# Patient Record
Sex: Female | Born: 1950
Health system: Southern US, Community
[De-identification: ages and names within clinical notes are randomized; demographics above are authoritative.]

## PROBLEM LIST (undated history)

## (undated) DIAGNOSIS — F419 Anxiety disorder, unspecified: Secondary | ICD-10-CM

## (undated) DIAGNOSIS — M67919 Unspecified disorder of synovium and tendon, unspecified shoulder: Secondary | ICD-10-CM

## (undated) DIAGNOSIS — I10 Essential (primary) hypertension: Secondary | ICD-10-CM

## (undated) DIAGNOSIS — M25559 Pain in unspecified hip: Secondary | ICD-10-CM

## (undated) DIAGNOSIS — M810 Age-related osteoporosis without current pathological fracture: Secondary | ICD-10-CM

## (undated) DIAGNOSIS — G43909 Migraine, unspecified, not intractable, without status migrainosus: Secondary | ICD-10-CM

## (undated) DIAGNOSIS — M1990 Unspecified osteoarthritis, unspecified site: Secondary | ICD-10-CM

## (undated) DIAGNOSIS — G2581 Restless legs syndrome: Secondary | ICD-10-CM

## (undated) DIAGNOSIS — Z8719 Personal history of other diseases of the digestive system: Secondary | ICD-10-CM

## (undated) DIAGNOSIS — G44009 Cluster headache syndrome, unspecified, not intractable: Secondary | ICD-10-CM

## (undated) DIAGNOSIS — N943 Premenstrual tension syndrome: Secondary | ICD-10-CM

## (undated) DIAGNOSIS — H409 Unspecified glaucoma: Secondary | ICD-10-CM

## (undated) DIAGNOSIS — F329 Major depressive disorder, single episode, unspecified: Secondary | ICD-10-CM

## (undated) DIAGNOSIS — M719 Bursopathy, unspecified: Secondary | ICD-10-CM

## (undated) DIAGNOSIS — J45909 Unspecified asthma, uncomplicated: Secondary | ICD-10-CM

## (undated) HISTORY — DX: Bursopathy, unspecified: M71.9

## (undated) HISTORY — DX: Unspecified glaucoma: H40.9

## (undated) HISTORY — DX: Restless legs syndrome: G25.81

## (undated) HISTORY — DX: Unspecified disorder of synovium and tendon, unspecified shoulder: M67.919

## (undated) HISTORY — DX: Essential (primary) hypertension: I10

## (undated) HISTORY — DX: Anxiety disorder, unspecified: F41.9

## (undated) HISTORY — DX: Migraine, unspecified, not intractable, without status migrainosus: G43.909

## (undated) HISTORY — PX: BREAST BIOPSY: SHX20

## (undated) HISTORY — DX: Pain in unspecified hip: M25.559

## (undated) HISTORY — DX: Premenstrual tension syndrome: N94.3

## (undated) HISTORY — DX: Cluster headache syndrome, unspecified, not intractable: G44.009

## (undated) HISTORY — DX: Age-related osteoporosis without current pathological fracture: M81.0

## (undated) HISTORY — PX: ABDOMINAL HYSTERECTOMY: SHX81

## (undated) HISTORY — DX: Major depressive disorder, single episode, unspecified: F32.9

---

## 1998-04-22 ENCOUNTER — Other Ambulatory Visit: Admission: RE | Admit: 1998-04-22 | Discharge: 1998-04-22 | Payer: Self-pay | Admitting: Radiology

## 1999-01-11 ENCOUNTER — Other Ambulatory Visit: Admission: RE | Admit: 1999-01-11 | Discharge: 1999-01-11 | Payer: Self-pay | Admitting: Gynecology

## 2000-01-12 ENCOUNTER — Other Ambulatory Visit: Admission: RE | Admit: 2000-01-12 | Discharge: 2000-01-12 | Payer: Self-pay | Admitting: Gynecology

## 2001-01-26 ENCOUNTER — Other Ambulatory Visit: Admission: RE | Admit: 2001-01-26 | Discharge: 2001-01-26 | Payer: Self-pay | Admitting: Gynecology

## 2001-02-02 ENCOUNTER — Encounter: Payer: Self-pay | Admitting: Gynecology

## 2001-02-02 ENCOUNTER — Ambulatory Visit (HOSPITAL_COMMUNITY): Admission: RE | Admit: 2001-02-02 | Discharge: 2001-02-02 | Payer: Self-pay | Admitting: Gynecology

## 2002-02-01 ENCOUNTER — Other Ambulatory Visit: Admission: RE | Admit: 2002-02-01 | Discharge: 2002-02-01 | Payer: Self-pay | Admitting: Gynecology

## 2002-05-24 ENCOUNTER — Ambulatory Visit (HOSPITAL_COMMUNITY): Admission: RE | Admit: 2002-05-24 | Discharge: 2002-05-24 | Payer: Self-pay | Admitting: Gastroenterology

## 2002-05-24 ENCOUNTER — Encounter (INDEPENDENT_AMBULATORY_CARE_PROVIDER_SITE_OTHER): Payer: Self-pay | Admitting: Specialist

## 2002-07-19 ENCOUNTER — Other Ambulatory Visit: Admission: RE | Admit: 2002-07-19 | Discharge: 2002-07-19 | Payer: Self-pay | Admitting: Gynecology

## 2003-02-18 ENCOUNTER — Other Ambulatory Visit: Admission: RE | Admit: 2003-02-18 | Discharge: 2003-02-18 | Payer: Self-pay | Admitting: Gynecology

## 2003-09-19 ENCOUNTER — Encounter: Admission: RE | Admit: 2003-09-19 | Discharge: 2003-09-19 | Payer: Self-pay | Admitting: Family Medicine

## 2004-03-04 ENCOUNTER — Other Ambulatory Visit: Admission: RE | Admit: 2004-03-04 | Discharge: 2004-03-04 | Payer: Self-pay | Admitting: Gynecology

## 2004-04-09 ENCOUNTER — Ambulatory Visit: Payer: Self-pay | Admitting: Family Medicine

## 2004-08-20 ENCOUNTER — Ambulatory Visit: Payer: Self-pay | Admitting: Family Medicine

## 2004-09-13 ENCOUNTER — Ambulatory Visit: Payer: Self-pay | Admitting: Family Medicine

## 2004-09-27 ENCOUNTER — Ambulatory Visit: Payer: Self-pay | Admitting: Gastroenterology

## 2004-09-27 ENCOUNTER — Ambulatory Visit: Payer: Self-pay | Admitting: Family Medicine

## 2004-10-01 ENCOUNTER — Encounter: Admission: RE | Admit: 2004-10-01 | Discharge: 2004-10-01 | Payer: Self-pay | Admitting: Family Medicine

## 2004-10-11 ENCOUNTER — Ambulatory Visit: Payer: Self-pay | Admitting: Gastroenterology

## 2004-10-12 ENCOUNTER — Encounter: Admission: RE | Admit: 2004-10-12 | Discharge: 2004-10-12 | Payer: Self-pay | Admitting: Family Medicine

## 2004-11-12 ENCOUNTER — Ambulatory Visit: Payer: Self-pay | Admitting: Family Medicine

## 2005-03-09 ENCOUNTER — Other Ambulatory Visit: Admission: RE | Admit: 2005-03-09 | Discharge: 2005-03-09 | Payer: Self-pay | Admitting: Gynecology

## 2005-08-23 ENCOUNTER — Ambulatory Visit: Payer: Self-pay | Admitting: Family Medicine

## 2005-10-03 ENCOUNTER — Ambulatory Visit: Payer: Self-pay | Admitting: Family Medicine

## 2005-10-10 ENCOUNTER — Ambulatory Visit: Payer: Self-pay | Admitting: Family Medicine

## 2005-12-02 ENCOUNTER — Ambulatory Visit: Payer: Self-pay | Admitting: Family Medicine

## 2006-03-13 ENCOUNTER — Other Ambulatory Visit: Admission: RE | Admit: 2006-03-13 | Discharge: 2006-03-13 | Payer: Self-pay | Admitting: Gynecology

## 2006-06-26 ENCOUNTER — Ambulatory Visit (HOSPITAL_COMMUNITY): Admission: RE | Admit: 2006-06-26 | Discharge: 2006-06-26 | Payer: Self-pay | Admitting: Gastroenterology

## 2006-08-31 ENCOUNTER — Ambulatory Visit: Payer: Self-pay | Admitting: Family Medicine

## 2006-09-21 ENCOUNTER — Ambulatory Visit: Payer: Self-pay | Admitting: Family Medicine

## 2006-09-25 ENCOUNTER — Encounter: Payer: Self-pay | Admitting: Family Medicine

## 2006-09-25 DIAGNOSIS — I10 Essential (primary) hypertension: Secondary | ICD-10-CM

## 2006-09-25 DIAGNOSIS — H409 Unspecified glaucoma: Secondary | ICD-10-CM

## 2006-09-25 DIAGNOSIS — F3289 Other specified depressive episodes: Secondary | ICD-10-CM | POA: Insufficient documentation

## 2006-09-25 DIAGNOSIS — F329 Major depressive disorder, single episode, unspecified: Secondary | ICD-10-CM

## 2006-09-25 HISTORY — DX: Major depressive disorder, single episode, unspecified: F32.9

## 2006-09-25 HISTORY — DX: Essential (primary) hypertension: I10

## 2006-09-25 HISTORY — DX: Unspecified glaucoma: H40.9

## 2006-09-25 HISTORY — DX: Other specified depressive episodes: F32.89

## 2006-10-05 ENCOUNTER — Ambulatory Visit: Payer: Self-pay | Admitting: Family Medicine

## 2006-10-05 LAB — CONVERTED CEMR LAB
ALT: 35 units/L (ref 0–35)
AST: 35 units/L (ref 0–37)
Albumin: 3.7 g/dL (ref 3.5–5.2)
Alkaline Phosphatase: 98 units/L (ref 39–117)
BUN: 12 mg/dL (ref 6–23)
Basophils Absolute: 0 10*3/uL (ref 0.0–0.1)
Basophils Relative: 0.7 % (ref 0.0–1.0)
Bilirubin, Direct: 0.1 mg/dL (ref 0.0–0.3)
Blood in Urine, dipstick: NEGATIVE
CO2: 29 meq/L (ref 19–32)
Calcium: 10.3 mg/dL (ref 8.4–10.5)
Chloride: 105 meq/L (ref 96–112)
Cholesterol: 222 mg/dL (ref 0–200)
Creatinine, Ser: 0.5 mg/dL (ref 0.4–1.2)
Direct LDL: 151.6 mg/dL
Eosinophils Absolute: 0.1 10*3/uL (ref 0.0–0.6)
Eosinophils Relative: 3.6 % (ref 0.0–5.0)
GFR calc Af Amer: 166 mL/min
GFR calc non Af Amer: 137 mL/min
Glucose, Bld: 91 mg/dL (ref 70–99)
Glucose, Urine, Semiquant: NEGATIVE
HCT: 39.2 % (ref 36.0–46.0)
HDL: 46.5 mg/dL (ref >39.0)
Hemoglobin: 13.5 g/dL (ref 12.0–15.0)
Ketones, urine, test strip: NEGATIVE
Lymphocytes Relative: 60 % — ABNORMAL HIGH (ref 12.0–46.0)
MCHC: 34.5 g/dL (ref 30.0–36.0)
MCV: 88.5 fL (ref 78.0–100.0)
Monocytes Absolute: 0.3 10*3/uL (ref 0.2–0.7)
Monocytes Relative: 11 % (ref 3.0–11.0)
Neutro Abs: 0.6 10*3/uL — ABNORMAL LOW (ref 1.4–7.7)
Neutrophils Relative %: 24.7 % — ABNORMAL LOW (ref 43.0–77.0)
Nitrite: NEGATIVE
Platelets: 261 10*3/uL (ref 150–400)
Potassium: 3.9 meq/L (ref 3.5–5.1)
Protein, U semiquant: NEGATIVE
RBC: 4.43 M/uL (ref 3.87–5.11)
RDW: 12.2 % (ref 11.5–14.6)
Sodium: 141 meq/L (ref 135–145)
Specific Gravity, Urine: 1.02
TSH: 1.62 microintl units/mL (ref 0.35–5.50)
Total Bilirubin: 0.6 mg/dL (ref 0.3–1.2)
Total CHOL/HDL Ratio: 4.8
Total Protein: 7.6 g/dL (ref 6.0–8.3)
Triglycerides: 82 mg/dL (ref 0–149)
Urobilinogen, UA: 1
VLDL: 16 mg/dL (ref 0–40)
WBC Urine, dipstick: NEGATIVE
WBC: 2.3 10*3/uL — ABNORMAL LOW (ref 4.5–10.5)
pH: 7

## 2006-10-14 DIAGNOSIS — G43909 Migraine, unspecified, not intractable, without status migrainosus: Secondary | ICD-10-CM | POA: Insufficient documentation

## 2006-10-14 DIAGNOSIS — N943 Premenstrual tension syndrome: Secondary | ICD-10-CM | POA: Insufficient documentation

## 2006-10-14 HISTORY — DX: Premenstrual tension syndrome: N94.3

## 2006-10-14 HISTORY — DX: Migraine, unspecified, not intractable, without status migrainosus: G43.909

## 2006-10-16 ENCOUNTER — Ambulatory Visit: Payer: Self-pay | Admitting: Family Medicine

## 2006-10-16 DIAGNOSIS — G2581 Restless legs syndrome: Secondary | ICD-10-CM

## 2006-10-16 DIAGNOSIS — M810 Age-related osteoporosis without current pathological fracture: Secondary | ICD-10-CM | POA: Insufficient documentation

## 2006-10-16 HISTORY — DX: Restless legs syndrome: G25.81

## 2006-10-16 HISTORY — DX: Age-related osteoporosis without current pathological fracture: M81.0

## 2006-12-18 ENCOUNTER — Ambulatory Visit: Payer: Self-pay | Admitting: Family Medicine

## 2006-12-18 DIAGNOSIS — M67919 Unspecified disorder of synovium and tendon, unspecified shoulder: Secondary | ICD-10-CM

## 2006-12-18 DIAGNOSIS — M719 Bursopathy, unspecified: Secondary | ICD-10-CM

## 2006-12-18 HISTORY — DX: Unspecified disorder of synovium and tendon, unspecified shoulder: M71.9

## 2006-12-18 HISTORY — DX: Bursopathy, unspecified: M67.919

## 2007-11-06 ENCOUNTER — Ambulatory Visit: Payer: Self-pay | Admitting: Family Medicine

## 2007-11-06 LAB — CONVERTED CEMR LAB
ALT: 18 units/L (ref 0–35)
AST: 18 units/L (ref 0–37)
Albumin: 3.9 g/dL (ref 3.5–5.2)
Alkaline Phosphatase: 69 units/L (ref 39–117)
BUN: 13 mg/dL (ref 6–23)
Basophils Absolute: 0 10*3/uL (ref 0.0–0.1)
Basophils Relative: 0.5 % (ref 0.0–3.0)
Bilirubin Urine: NEGATIVE
Bilirubin, Direct: 0.1 mg/dL (ref 0.0–0.3)
Blood in Urine, dipstick: NEGATIVE
CO2: 31 meq/L (ref 19–32)
Calcium: 10.6 mg/dL — ABNORMAL HIGH (ref 8.4–10.5)
Chloride: 110 meq/L (ref 96–112)
Cholesterol: 208 mg/dL (ref 0–200)
Creatinine, Ser: 0.6 mg/dL (ref 0.4–1.2)
Direct LDL: 144.2 mg/dL
Eosinophils Absolute: 0.1 10*3/uL (ref 0.0–0.7)
Eosinophils Relative: 4.2 % (ref 0.0–5.0)
GFR calc Af Amer: 134 mL/min
GFR calc non Af Amer: 111 mL/min
Glucose, Bld: 84 mg/dL (ref 70–99)
Glucose, Urine, Semiquant: NEGATIVE
HCT: 40.5 % (ref 36.0–46.0)
HDL: 39.1 mg/dL (ref >39.0)
Hemoglobin: 13.6 g/dL (ref 12.0–15.0)
Ketones, urine, test strip: NEGATIVE
Lymphocytes Relative: 47.4 % — ABNORMAL HIGH (ref 12.0–46.0)
MCHC: 33.5 g/dL (ref 30.0–36.0)
MCV: 90.9 fL (ref 78.0–100.0)
Monocytes Absolute: 0.3 10*3/uL (ref 0.1–1.0)
Monocytes Relative: 11 % (ref 3.0–12.0)
Neutro Abs: 0.9 10*3/uL — ABNORMAL LOW (ref 1.4–7.7)
Neutrophils Relative %: 36.9 % — ABNORMAL LOW (ref 43.0–77.0)
Nitrite: NEGATIVE
Platelets: 286 10*3/uL (ref 150–400)
Potassium: 4.2 meq/L (ref 3.5–5.1)
Protein, U semiquant: NEGATIVE
RBC: 4.45 M/uL (ref 3.87–5.11)
RDW: 12.4 % (ref 11.5–14.6)
Sodium: 144 meq/L (ref 135–145)
Specific Gravity, Urine: 1.02
TSH: 2.06 microintl units/mL (ref 0.35–5.50)
Total Bilirubin: 0.8 mg/dL (ref 0.3–1.2)
Total CHOL/HDL Ratio: 5.3
Total Protein: 7.9 g/dL (ref 6.0–8.3)
Triglycerides: 84 mg/dL (ref 0–149)
Urobilinogen, UA: 0.2
VLDL: 17 mg/dL (ref 0–40)
WBC: 2.4 10*3/uL — ABNORMAL LOW (ref 4.5–10.5)
pH: 7

## 2007-11-13 ENCOUNTER — Ambulatory Visit: Payer: Self-pay | Admitting: Family Medicine

## 2007-11-13 DIAGNOSIS — IMO0002 Reserved for concepts with insufficient information to code with codable children: Secondary | ICD-10-CM | POA: Insufficient documentation

## 2007-11-14 ENCOUNTER — Telehealth: Payer: Self-pay | Admitting: *Deleted

## 2008-09-29 ENCOUNTER — Telehealth: Payer: Self-pay | Admitting: Family Medicine

## 2008-11-10 ENCOUNTER — Ambulatory Visit: Payer: Self-pay | Admitting: Family Medicine

## 2008-11-10 LAB — CONVERTED CEMR LAB
ALT: 23 units/L (ref 0–35)
AST: 28 units/L (ref 0–37)
Albumin: 3.7 g/dL (ref 3.5–5.2)
Alkaline Phosphatase: 85 units/L (ref 39–117)
BUN: 13 mg/dL (ref 6–23)
Basophils Absolute: 0.2 10*3/uL — ABNORMAL HIGH (ref 0.0–0.1)
Basophils Relative: 5 % — ABNORMAL HIGH (ref 0.0–3.0)
Bilirubin Urine: NEGATIVE
Bilirubin, Direct: 0 mg/dL (ref 0.0–0.3)
Blood in Urine, dipstick: NEGATIVE
CO2: 26 meq/L (ref 19–32)
Calcium: 9.3 mg/dL (ref 8.4–10.5)
Chloride: 111 meq/L (ref 96–112)
Cholesterol: 190 mg/dL (ref 0–200)
Creatinine, Ser: 0.6 mg/dL (ref 0.4–1.2)
Eosinophils Absolute: 0.1 10*3/uL (ref 0.0–0.7)
Eosinophils Relative: 2.5 % (ref 0.0–5.0)
GFR calc non Af Amer: 133.12 mL/min (ref >60)
Glucose, Bld: 86 mg/dL (ref 70–99)
Glucose, Urine, Semiquant: NEGATIVE
HCT: 36.8 % (ref 36.0–46.0)
HDL: 47.8 mg/dL (ref >39.00)
Hemoglobin: 12.3 g/dL (ref 12.0–15.0)
Ketones, urine, test strip: NEGATIVE
LDL Cholesterol: 126 mg/dL — ABNORMAL HIGH (ref 0–99)
Lymphocytes Relative: 48.1 % — ABNORMAL HIGH (ref 12.0–46.0)
Lymphs Abs: 1.4 10*3/uL (ref 0.7–4.0)
MCHC: 33.4 g/dL (ref 30.0–36.0)
MCV: 90.1 fL (ref 78.0–100.0)
Monocytes Absolute: 0.3 10*3/uL (ref 0.1–1.0)
Monocytes Relative: 9.7 % (ref 3.0–12.0)
Neutro Abs: 1.1 10*3/uL — ABNORMAL LOW (ref 1.4–7.7)
Neutrophils Relative %: 34.7 % — ABNORMAL LOW (ref 43.0–77.0)
Nitrite: NEGATIVE
Platelets: 260 10*3/uL (ref 150.0–400.0)
Potassium: 3.8 meq/L (ref 3.5–5.1)
Protein, U semiquant: NEGATIVE
RBC: 4.09 M/uL (ref 3.87–5.11)
RDW: 12.3 % (ref 11.5–14.6)
Sodium: 141 meq/L (ref 135–145)
Specific Gravity, Urine: 1.02
TSH: 1.88 microintl units/mL (ref 0.35–5.50)
Total Bilirubin: 0.7 mg/dL (ref 0.3–1.2)
Total CHOL/HDL Ratio: 4
Total Protein: 7.3 g/dL (ref 6.0–8.3)
Triglycerides: 80 mg/dL (ref 0.0–149.0)
Urobilinogen, UA: 0.2
VLDL: 16 mg/dL (ref 0.0–40.0)
WBC: 3.1 10*3/uL — ABNORMAL LOW (ref 4.5–10.5)
pH: 7

## 2008-11-11 ENCOUNTER — Encounter: Payer: Self-pay | Admitting: Family Medicine

## 2008-11-12 ENCOUNTER — Encounter: Admission: RE | Admit: 2008-11-12 | Discharge: 2008-11-12 | Payer: Self-pay | Admitting: Family Medicine

## 2008-11-12 ENCOUNTER — Ambulatory Visit: Payer: Self-pay | Admitting: Family Medicine

## 2008-11-12 ENCOUNTER — Telehealth: Payer: Self-pay | Admitting: *Deleted

## 2009-07-23 ENCOUNTER — Ambulatory Visit: Payer: Self-pay | Admitting: Family Medicine

## 2009-07-23 DIAGNOSIS — G44009 Cluster headache syndrome, unspecified, not intractable: Secondary | ICD-10-CM | POA: Insufficient documentation

## 2009-07-23 HISTORY — DX: Cluster headache syndrome, unspecified, not intractable: G44.009

## 2009-10-14 ENCOUNTER — Encounter (INDEPENDENT_AMBULATORY_CARE_PROVIDER_SITE_OTHER): Payer: Self-pay | Admitting: *Deleted

## 2009-11-11 ENCOUNTER — Ambulatory Visit: Payer: Self-pay | Admitting: Family Medicine

## 2009-11-11 LAB — CONVERTED CEMR LAB
ALT: 27 units/L (ref 0–35)
AST: 23 units/L (ref 0–37)
Albumin: 3.8 g/dL (ref 3.5–5.2)
Alkaline Phosphatase: 117 units/L (ref 39–117)
BUN: 13 mg/dL (ref 6–23)
Basophils Absolute: 0 10*3/uL (ref 0.0–0.1)
Basophils Relative: 1 % (ref 0.0–3.0)
Bilirubin Urine: NEGATIVE
Bilirubin, Direct: 0.1 mg/dL (ref 0.0–0.3)
Blood in Urine, dipstick: NEGATIVE
CO2: 27 meq/L (ref 19–32)
Calcium: 10.6 mg/dL — ABNORMAL HIGH (ref 8.4–10.5)
Chloride: 105 meq/L (ref 96–112)
Cholesterol: 194 mg/dL (ref 0–200)
Creatinine, Ser: 0.5 mg/dL (ref 0.4–1.2)
Eosinophils Absolute: 0.1 10*3/uL (ref 0.0–0.7)
Eosinophils Relative: 3.7 % (ref 0.0–5.0)
GFR calc non Af Amer: 160 mL/min (ref >60)
Glucose, Bld: 81 mg/dL (ref 70–99)
Glucose, Urine, Semiquant: NEGATIVE
HCT: 38.3 % (ref 36.0–46.0)
HDL: 52.9 mg/dL (ref >39.00)
Hemoglobin: 12.8 g/dL (ref 12.0–15.0)
Ketones, urine, test strip: NEGATIVE
LDL Cholesterol: 125 mg/dL — ABNORMAL HIGH (ref 0–99)
Lymphocytes Relative: 37.5 % (ref 12.0–46.0)
Lymphs Abs: 1.3 10*3/uL (ref 0.7–4.0)
MCHC: 33.5 g/dL (ref 30.0–36.0)
MCV: 90.3 fL (ref 78.0–100.0)
Monocytes Absolute: 0.4 10*3/uL (ref 0.1–1.0)
Monocytes Relative: 10.5 % (ref 3.0–12.0)
Neutro Abs: 1.7 10*3/uL (ref 1.4–7.7)
Neutrophils Relative %: 47.3 % (ref 43.0–77.0)
Nitrite: NEGATIVE
Platelets: 315 10*3/uL (ref 150.0–400.0)
Potassium: 4.4 meq/L (ref 3.5–5.1)
Protein, U semiquant: NEGATIVE
RBC: 4.24 M/uL (ref 3.87–5.11)
RDW: 14.1 % (ref 11.5–14.6)
Sodium: 139 meq/L (ref 135–145)
Specific Gravity, Urine: 1.02
TSH: 1.62 microintl units/mL (ref 0.35–5.50)
Total Bilirubin: 0.5 mg/dL (ref 0.3–1.2)
Total CHOL/HDL Ratio: 4
Total Protein: 7.5 g/dL (ref 6.0–8.3)
Triglycerides: 80 mg/dL (ref 0.0–149.0)
Urobilinogen, UA: 0.2
VLDL: 16 mg/dL (ref 0.0–40.0)
WBC: 3.6 10*3/uL — ABNORMAL LOW (ref 4.5–10.5)
pH: 6.5

## 2009-11-19 ENCOUNTER — Ambulatory Visit: Payer: Self-pay | Admitting: Family Medicine

## 2009-12-15 ENCOUNTER — Encounter: Admission: RE | Admit: 2009-12-15 | Discharge: 2009-12-15 | Payer: Self-pay | Admitting: Family Medicine

## 2009-12-17 ENCOUNTER — Ambulatory Visit: Payer: Self-pay | Admitting: Family Medicine

## 2009-12-17 DIAGNOSIS — M25559 Pain in unspecified hip: Secondary | ICD-10-CM | POA: Insufficient documentation

## 2009-12-17 HISTORY — DX: Pain in unspecified hip: M25.559

## 2010-01-08 ENCOUNTER — Telehealth: Payer: Self-pay | Admitting: Family Medicine

## 2010-01-08 ENCOUNTER — Telehealth (INDEPENDENT_AMBULATORY_CARE_PROVIDER_SITE_OTHER): Payer: Self-pay | Admitting: *Deleted

## 2010-03-01 ENCOUNTER — Telehealth: Payer: Self-pay | Admitting: Family Medicine

## 2010-04-04 ENCOUNTER — Encounter: Payer: Self-pay | Admitting: Family Medicine

## 2010-04-15 NOTE — Progress Notes (Signed)
Summary: prozac rx  Phone Note Call from Patient   Summary of Call: patient and pharmacy calling because prozac 40 mg caps can not be split.  also the 40 mg does not come in tab form.  any suggestions?  pt number is [phone] pharmacy [facility] Initial call taken by: Kern Reap CMA Duncan Dull),  November 12, 2008 11:56 AM  Follow-up for Phone Call        please call in the 20-mg and the 40 mg so her total daily dose is 60 dispense of hundred of each refills x 3 Follow-up by: Roderick Pee MD,  November 12, 2008 12:36 PM  Additional Follow-up for Phone Call Additional follow up Details #1::        rx sent left message on machine for patient  Additional Follow-up by: Kern Reap CMA (AAMA),  November 12, 2008 1:56 PM    New/Updated Medications: PROZAC 20 MG CAPS (FLUOXETINE HCL) take one tab once daily Prescriptions: PROZAC 20 MG CAPS (FLUOXETINE HCL) take one tab once daily  #90 x 3   Entered by:   Kern Reap CMA (AAMA)   Authorized by:   Roderick Pee MD   Signed by:   Kern Reap CMA (AAMA) on 11/12/2008   Method used:   Electronically to        [facility] [address] (retail)       [phone]       [place], [place]  [address]       Ph: [phone]       Fax: [phone]   RxID:   [id number]   Appended Document: prozac rx    Prescriptions: PROZAC 40 MG  CAPS (FLUOXETINE HCL) Take 1 tablet by mouth once a day  #100 x 3   Entered by:   Darra Lis RMA   Authorized by:   Roderick Pee MD   Signed by:   Darra Lis RMA on 11/18/2008   Method used:   Electronically to        [facility] [address] (retail)       [phone]       [place], [place]  [address]       Ph: [phone]       Fax: [phone]   RxID:   [id number] PROZAC 40 MG  CAPS (FLUOXETINE HCL) 1 & 1/2 qday  #150 x 3   Entered by:   Darra Lis RMA   Authorized by:   Roderick Pee MD   Signed by:   Darra Lis RMA on 11/18/2008   Method used:   Electronically to        [facility] [address] (retail)       [phone]       [place], [place]  [address]       Ph: [phone]       Fax: [phone]   RxID:   [id number]    Appended Document: prozac rx Patient calling stating she needs a script for Prozac 40mg -Prozac 40mg  1 &1/2 qday #150 ordered in error-called pharmacy to cancel this order.

## 2010-04-15 NOTE — Progress Notes (Signed)
Summary: refill request  Phone Note Refill Request Message from:  Patient on March 01, 2010 11:11 AM  Refills Requested: Medication #1:  HYDROCHLOROTHIAZIDE 25 MG  TABS Take 1 tablet by mouth once a day  Medication #2:  ZESTRIL 10 MG  TABS Take 1 tablet by mouth once a day  Medication #3:  MIRAPEX 0.25 MG TABS 1 tab @ bedtime  Medication #4:  CELEXA 40 MG TABS 1 tab @ bedtime. patient insurance now requires mail in for rx   Method Requested: Pick up at Office Initial call taken by: Kern Reap CMA Duncan Dull),  March 01, 2010 11:12 AM    Prescriptions: CELEXA 40 MG TABS (CITALOPRAM HYDROBROMIDE) 1 tab @ bedtime  #100 x 3   Entered by:   Kern Reap CMA (AAMA)   Authorized by:   Roderick Pee MD   Signed by:   Kern Reap CMA (AAMA) on 03/01/2010   Method used:   Print then Give to Patient   RxID:   [id number] MIRAPEX 0.25 MG TABS (PRAMIPEXOLE DIHYDROCHLORIDE) 1 tab @ bedtime  #100 x 3   Entered by:   Kern Reap CMA (AAMA)   Authorized by:   Roderick Pee MD   Signed by:   Kern Reap CMA (AAMA) on 03/01/2010   Method used:   Print then Give to Patient   RxID:   [id number] ZESTRIL 10 MG  TABS (LISINOPRIL) Take 1 tablet by mouth once a day  #100 x 3   Entered by:   Kern Reap CMA (AAMA)   Authorized by:   Roderick Pee MD   Signed by:   Kern Reap CMA (AAMA) on 03/01/2010   Method used:   Print then Give to Patient   RxID:   [id number] HYDROCHLOROTHIAZIDE 25 MG  TABS (HYDROCHLOROTHIAZIDE) Take 1 tablet by mouth once a day  #100 x 3   Entered by:   Kern Reap CMA (AAMA)   Authorized by:   Roderick Pee MD   Signed by:   Kern Reap CMA (AAMA) on 03/01/2010   Method used:   Print then Give to Patient   RxID:   [id number]

## 2010-04-15 NOTE — Progress Notes (Signed)
  Phone Note Refill Request   Refills Requested: Medication #1:  ZESTRIL 10 MG  TABS Take 1 tablet by mouth once a day

## 2010-04-15 NOTE — Assessment & Plan Note (Signed)
Summary: 1 month rov/njr   Vital Signs:  Patient profile:   60 year old female Menstrual status:  hysterectomy Weight:      125 pounds Temp:     98.5 degrees F oral BP sitting:   110 / 80  (left arm) Cuff size:   regular  Vitals Entered By: Kern Reap CMA Duncan Dull) (December 17, 2009 3:38 PM) CC: follow-up visit, left leg pain   CC:  follow-up visit and left leg pain.  History of Present Illness: Rhonda Bush is a 60 year old female, who comes in today for reevaluation of depression, and left leg pain x 2 weeks.  In September we increased her Prozac from 40 mg daily to 80 mg daily.  She states she doesn't feel a lot better.  Her mood is still low, and very low energy level.  The past two weeks.  His tests and discomfort in her left leg that radiates to the groin and down for left knee.  She states she had the same problem about two years ago.  It lasted her for days and went away and now some present for two weeks.  No history of trauma.  Review of systems negative  Allergies: No Known Drug Allergies  Past History:  Past medical, surgical, family and social histories (including risk factors) reviewed for relevance to current acute and chronic problems.  Past Medical History: Reviewed history from 09/25/2006 and no changes required. MHA Depression PMS GLAUCOMA Hypertension  Past Surgical History: Reviewed history from 09/25/2006 and no changes required. TAH/BSO- FIBROIDS    CBX1  Family History: Reviewed history from 10/14/2006 and no changes required. Family History of CAD Female 1st degree relative <60 Family History of CAD Female 1st degree relative <50 Family History Diabetes 1st degree relative father MI X3  Social History: Reviewed history from 11/13/2007 and no changes required. Occupation: Married Never Smoked Alcohol use-no Drug use-no Regular exercise-yes  Review of Systems      See HPI  Physical Exam  General:  Well-developed,well-nourished,in no  acute distress; alert,appropriate and cooperative throughout examination Msk:  tenderness in the left inguinal ligament to palpation Pulses:  R and L carotid,radial,femoral,dorsalis pedis and posterior tibial pulses are full and equal bilaterally Extremities:  No clubbing, cyanosis, edema, or deformity noted with normal full range of motion of all joints.   Neurologic:  No cranial nerve deficits noted. Station and gait are normal. Plantar reflexes are down-going bilaterally. DTRs are symmetrical throughout. Sensory, motor and coordinative functions appear intact. Psych:  Cognition and judgment appear intact. Alert and cooperative with normal attention span and concentration. No apparent delusions, illusions, hallucinations   Problems:  Medical Problems Added: 1)  Dx of Pelvic Region, Pain  (ICD-719.45)  Impression & Recommendations:  Problem # 1:  PELVIC REGION, PAIN (ICD-719.45) Assessment New  Her updated medication list for this problem includes:    Aspirin 81 Mg Tbec (Aspirin) ..... Qd  Orders: Prescription Created Electronically [phone])  Problem # 2:  DEPRESSION (ICD-311) Assessment: Unchanged  The following medications were removed from the medication list:    Prozac 40 Mg Caps (Fluoxetine hcl) .Marland Kitchen... 2 poqhs Her updated medication list for this problem includes:    Celexa 40 Mg Tabs (Citalopram hydrobromide) .Marland Kitchen... 1 tab @ bedtime  Orders: Prescription Created Electronically [id number])  Complete Medication List: 1)  Hydrochlorothiazide 25 Mg Tabs (Hydrochlorothiazide) .... Take 1 tablet by mouth once a day 2)  Aspirin 81 Mg Tbec (Aspirin) .... Qd 3)  Zestril 10 Mg Tabs (  Lisinopril) .... Take 1 tablet by mouth once a day 4)  Mirapex 0.25 Mg Tabs (Pramipexole dihydrochloride) .Marland Kitchen.. 1 tab @ bedtime 5)  Celexa 40 Mg Tabs (Citalopram hydrobromide) .Marland Kitchen.. 1 tab @ bedtime  Patient Instructions: 1)  stopped the Prozac, and begin Celexa 40 mg a day at bedtime starting tomorrow night.   Return in one month for follow-up. 2)  Take 400 mg of Motrin 3 times a day with food for the discomfort in her left leg Prescriptions: CELEXA 40 MG TABS (CITALOPRAM HYDROBROMIDE) 1 tab @ bedtime  #100 x 3   Entered and Authorized by:   Roderick Pee MD   Signed by:   Roderick Pee MD on 12/17/2009   Method used:   Electronically to        [facility] [address] (retail)       [phone]       [place], [place]  [address]       Ph: [phone]       Fax: [phone]   RxID:   [id number]

## 2010-04-15 NOTE — Progress Notes (Signed)
Summary: Actonel pharmacy question  Phone Note From Pharmacy   Caller: [facility] [address] Call For: Tawanna Cooler  Summary of Call: Sue Lush called from pharmacy.  Pharmacist does not understand the Actonel directions. [phone] Initial call taken by: Lynann Beaver CMA,  November 14, 2007 1:41 PM  Follow-up for Phone Call        please call pharmacy, and clarify dosage give the patient a year supply Follow-up by: Roderick Pee MD,  November 15, 2007 1:16 PM  Additional Follow-up for Phone Call Additional follow up Details #1::        Pt called, she has been taking Actonel 1 X monthy.    Additional Follow-up for Phone Call Additional follow up Details #2::    would you like for the patient to take actonel 2 times a week for one month and then continue one tab a month? Follow-up by: Kern Reap CMA,  November 15, 2007 3:25 PM  Additional Follow-up for Phone Call Additional follow up Details #3:: Details for Additional Follow-up Action Taken: spoke with pt and pharm and okayed one time a month with 12 refills Additional Follow-up by: Kern Reap CMA,  November 15, 2007 3:56 PM

## 2010-04-15 NOTE — Assessment & Plan Note (Signed)
Summary: CPX/NO PAP/NJR   Vital Signs:  Patient profile:   60 year old female Menstrual status:  hysterectomy Height:      58.75 inches Weight:      135 pounds BMI:     27.60 Temp:     98.1 degrees F oral BP sitting:   124 / 84  (left arm) Cuff size:   regular  Vitals Entered By: Kern Reap CMA Duncan Dull) (November 12, 2008 8:40 AM)  Reason for Visit cpx  History of Present Illness: Rhonda Bush is a 60 year old female, who comes in today for evaluation of of multiple issues.  She has a history of underlying depression.  She is currently on Prozac 40 mg daily.  She states is not working well.  She still low energy.  We discussed options.  She likes to increase the ProzacHer blood pressure is treated with HCTZ 25 mg and Zestril 10 mg daily.  BP 124/84.  For osteoporosis.  She takes Actonel, on and 50 mg monthly along with calcium and vitamin D.  She also walks for 5 miles 3 times per week.  She takes Mirapex .2 nightly for restless leg, syndrome, it's working well.  She gets routine eye care, and dental care.  Colonoscopy two years ago, and GI was normal.  However, since her brother had colon cancer.  Advised to come back in 5 years.  Last tetanus booster 2000 will give her a booster today.  Mammogram was 08.  We had a long discussion about BSE monthly annual checkups here and annual mammography.  Allergies: No Known Drug Allergies  Past History:  Past medical, surgical, family and social histories (including risk factors) reviewed, and no changes noted (except as noted below).  Past Medical History: Reviewed history from 09/25/2006 and no changes required. MHA Depression PMS GLAUCOMA Hypertension  Past Surgical History: Reviewed history from 09/25/2006 and no changes required. TAH/BSO- FIBROIDS    CBX1  Family History: Reviewed history from 10/14/2006 and no changes required. Family History of CAD Female 1st degree relative <60 Family History of CAD Female 1st degree  relative <50 Family History Diabetes 1st degree relative father MI X3  Social History: Reviewed history from 11/13/2007 and no changes required. Occupation: Married Never Smoked Alcohol use-no Drug use-no Regular exercise-yes  Review of Systems      See HPI  Physical Exam  General:  Well-developed,well-nourished,in no acute distress; alert,appropriate and cooperative throughout examination Head:  Normocephalic and atraumatic without obvious abnormalities. No apparent alopecia or balding. Eyes:  No corneal or conjunctival inflammation noted. EOMI. Perrla. Funduscopic exam benign, without hemorrhages, exudates or papilledema. Vision grossly normal. Ears:  External ear exam shows no significant lesions or deformities.  Otoscopic examination reveals clear canals, tympanic membranes are intact bilaterally without bulging, retraction, inflammation or discharge. Hearing is grossly normal bilaterally. Nose:  External nasal examination shows no deformity or inflammation. Nasal mucosa are pink and moist without lesions or exudates. Mouth:  Oral mucosa and oropharynx without lesions or exudates.  Teeth in good repair. Neck:  No deformities, masses, or tenderness noted. Chest Wall:  No deformities, masses, or tenderness noted. Breasts:  No mass, nodules, thickening, tenderness, bulging, retraction, inflamation, nipple discharge or skin changes noted.   Lungs:  Normal respiratory effort, chest expands symmetrically. Lungs are clear to auscultation, no crackles or wheezes. Heart:  Normal rate and regular rhythm. S1 and S2 normal without gallop, murmur, click, rub or other extra sounds. Abdomen:  Bowel sounds positive,abdomen soft and non-tender without  masses, organomegaly or hernias noted. Rectal:  No external abnormalities noted. Normal sphincter tone. No rectal masses or tenderness. Genitalia:  Pelvic Exam:        External: normal female genitalia without lesions or masses        Vagina: normal  without lesions or masses        Cervix: normal without lesions or masses        Adnexa: normal bimanual exam without masses or fullness        Uterus: normal by palpation        Pap smear: not performed Msk:  No deformity or scoliosis noted of thoracic or lumbar spine.   Pulses:  R and L carotid,radial,femoral,dorsalis pedis and posterior tibial pulses are full and equal bilaterally Extremities:  No clubbing, cyanosis, edema, or deformity noted with normal full range of motion of all joints.   Neurologic:  No cranial nerve deficits noted. Station and gait are normal. Plantar reflexes are down-going bilaterally. DTRs are symmetrical throughout. Sensory, motor and coordinative functions appear intact. Skin:  total body skin exam negative.  She has numerous numerous freckles and moles and skin tags Cervical Nodes:  No lymphadenopathy noted Axillary Nodes:  No palpable lymphadenopathy Inguinal Nodes:  No significant adenopathy Psych:  Cognition and judgment appear intact. Alert and cooperative with normal attention span and concentration. No apparent delusions, illusions, hallucinations   Impression & Recommendations:  Problem # 1:  HYPERTENSION NEC (ICD-997.91) Assessment Improved  Orders: Prescription Created Electronically [phone])  Problem # 2:  OSTEOPOROSIS NOS (ICD-733.00) Assessment: Improved  Her updated medication list for this problem includes:    Actonel 150 Mg Tabs (Risedronate sodium) .Marland Kitchen... Take one tab every month  Orders: Prescription Created Electronically [phone]) T-Bone Densitometry [phone])  Problem # 3:  RESTLESS LEG SYNDROME, SEVERE (ICD-333.94) Assessment: Improved  Orders: Prescription Created Electronically [phone])  Problem # 4:  DEPRESSION (ICD-311) Assessment: Deteriorated  Her updated medication list for this problem includes:    Prozac 40 Mg Caps (Fluoxetine hcl) .Marland Kitchen... 1 & 1/2 qday  Orders: Prescription Created Electronically [phone])  Complete  Medication List: 1)  Prozac 40 Mg Caps (Fluoxetine hcl) .Marland Kitchen.. 1 & 1/2 qday 2)  Hydrochlorothiazide 25 Mg Tabs (Hydrochlorothiazide) .... Take 1 tablet by mouth once a day 3)  Aspirin 81 Mg Tbec (Aspirin) .... Qd 4)  Zestril 10 Mg Tabs (Lisinopril) .... Take 1 tablet by mouth once a day 5)  Mirapex 0.25 Mg Tabs (Pramipexole dihydrochloride) .Marland Kitchen.. 1 tab @ bedtime 6)  Actonel 150 Mg Tabs (Risedronate sodium) .... Take one tab every month  Other Orders: EKG w/ Interpretation (93000) Tdap => 39yrs IM (13086) Admin 1st Vaccine (57846)  Patient Instructions: 1)  Schedule your mammogram.today!!!!!!!!!!!!!!!!!!!! 2)  Please schedule a follow-up appointment in 1 year. 3)  It is important that you exercise regularly at least 20 minutes 5 times a week. If you develop chest pain, have severe difficulty breathing, or feel very tired , stop exercising immediately and seek medical attention. 4)  Take calcium +Vitamin D daily. 5)  Take an Aspirin every day. Prescriptions: ACTONEL 150 MG TABS (RISEDRONATE SODIUM) take one tab every month  #12 x 3   Entered and Authorized by:   Roderick Pee MD   Signed by:   Roderick Pee MD on 11/12/2008   Method used:   Electronically to        [facility] [address] (retail)       [phone]  [place], [place]  [address]       Ph: [phone]       Fax: [phone]   RxID:   [id number] MIRAPEX 0.25 MG  TABS (PRAMIPEXOLE DIHYDROCHLORIDE) 1 tab @ bedtime  #100 x 3   Entered and Authorized by:   Roderick Pee MD   Signed by:   Roderick Pee MD on 11/12/2008   Method used:   Electronically to        [facility] [address] (retail)       [phone]       [place], [place]  [address]       Ph: [phone]       Fax: [phone]   RxID:   [id number] ZESTRIL 10 MG  TABS (LISINOPRIL) Take 1 tablet by mouth once a day  #100 x 3   Entered and Authorized by:   Roderick Pee MD    Signed by:   Roderick Pee MD on 11/12/2008   Method used:   Electronically to        [facility] [address] (retail)       [phone]       [place], [place]  [address]       Ph: [phone]       Fax: [phone]   RxID:   [id number] HYDROCHLOROTHIAZIDE 25 MG  TABS (HYDROCHLOROTHIAZIDE) Take 1 tablet by mouth once a day  #100 x 3   Entered and Authorized by:   Roderick Pee MD   Signed by:   Roderick Pee MD on 11/12/2008   Method used:   Electronically to        [facility] [address] (retail)       [phone]       [place], [place]  [address]       Ph: [phone]       Fax: [phone]   RxID:   [id number] PROZAC 40 MG  CAPS (FLUOXETINE HCL) 1 & 1/2 qday  #150 x 3   Entered and Authorized by:   Roderick Pee MD   Signed by:   Roderick Pee MD on 11/12/2008   Method used:   Electronically to        [facility] [address] (retail)       [phone]       [place], [place]  [address]       Ph: [phone]       Fax: [phone]   RxID:   [id number]    Immunizations Administered:  Tetanus Vaccine:    Vaccine Type: Tdap    Site: right deltoid    Mfr: [employer] Paste[employer]    Dose: 0.5 ml    Route: IM    Given by: Kern Reap CMA (AAMA)    Exp. Date: 01/09/2010    Lot #: [id number]    Physician counseled: yes

## 2010-04-15 NOTE — Miscellaneous (Signed)
Summary: BONE DENSITY  Clinical Lists Changes  Orders: Added new Test order of T-Lumbar Vertebral Assessment (77082) - Signed 

## 2010-04-15 NOTE — Assessment & Plan Note (Signed)
Summary: CPX/NO PAP/NJR//res per pt/jnl   Vital Signs:  Patient Profile:   60 Years Old Female Height:     59 inches Weight:      134 pounds BMI:     27.16 Temp:     98.2 degrees F oral Pulse rate:   68 / minute Pulse rhythm:   regular Resp:     12 per minute BP sitting:   96 / 60  Pt. in pain?   no  Vitals Entered By: Lynann Beaver CMA (October 16, 2006 4:33 PM)                Chief Complaint:  cpx.  History of Present Illness: Rhonda Bush comes in today for evaluation for numerous health problems.  She says, overall she's had a good year.  She takes 40 mg of Prozac for underlying depression and has served her well.  She takes calcium, vitamin D, and 35 mg of Actonel for her osteopenia.  She also takes 25 mg of HCTZ for fluid retention and hypertension along with the 10 mg of Zestril.  She takes Mirapex .25 nightly for restless leg syndrome.  She's had a TAH and BSO.  She saw Dr. Lily Peer for pelvic examination.  She has seen Dr. Arlyce Dice for GI evaluation.  Colonoscopy in 06, however, Dr. Madaline [facility] to Dr. Loreta Ave for colonoscopy.  She continues to see the ophthalmologist to [facility] ophthalmology because of underlying glaucoma.  She is on Celexa 10 eyedrops.  His elbow or wrist of her health maintenance.  Activities.  Her last tetanus fistula 2000.  And update [place] is that when she went for rest exam of June of 08.  She was told showed early bilateral cataracts.  Acute Visit History:      She denies abdominal pain, chest pain, constipation, cough, diarrhea, earache, eye symptoms, fever, genitourinary symptoms, headache, musculoskeletal symptoms, nasal discharge, nausea, rash, sinus problems, sore throat, and vomiting.         Current Allergies: No known allergies   Past Medical History:    Reviewed history from 09/25/2006 and no changes required:       MHA       Depression       PMS       GLAUCOMA       Hypertension   Family History:    Reviewed history from 10/14/2006 and  no changes required:       Family History of CAD Female 1st degree relative <60       Family History of CAD Female 1st degree relative <50       Family History Diabetes 1st degree relative       father MI X3  Social History:    Reviewed history and no changes required:   Risk Factors:  Passive smoke exposure:  no Drug use:  no HIV high-risk behavior:  no Alcohol use:  no Exercise:  yes    Times per week:  5    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 No apparent alopecia or balding. Eyes:     No corneal or conjunctival inflammation noted. EOMI. Perrla. Funduscopic exam benign, without hemorrhages, exudates or papilledema. Vision grossly normal. Ears:     External ear exam shows no significant lesions or deformities.  Otoscopic examination reveals clear canals, tympanic membranes are intact bilaterally without bulging, retraction, inflammation or discharge. Hearing is grossly normal bilaterally. Nose:     External nasal examination shows no deformity or inflammation. Nasal mucosa are pink and moist without lesions or exudates. Mouth:     Oral mucosa and oropharynx without lesions or exudates.  Teeth in good repair. Neck:     No deformities, masses, or tenderness noted. Chest Wall:     No deformities, masses, or tenderness noted. Breasts:     No mass, nodules, thickening, tenderness, bulging, retraction, inflamation, nipple discharge or skin changes noted.   Lungs:     Normal respiratory effort, chest expands symmetrically. Lungs are clear to auscultation, no crackles or  wheezes. Heart:     Normal rate and regular rhythm. S1 and S2 normal without gallop, murmur, click, rub or other extra sounds. Abdomen:     Bowel sounds positive,abdomen soft and non-tender without masses, organomegaly or hernias noted. Msk:     No deformity or scoliosis noted of thoracic or lumbar spine.   Pulses:     R and L carotid,radial,femoral,dorsalis pedis and posterior tibial pulses are full and equal bilaterally Extremities:     No clubbing, cyanosis, edema, or deformity noted with normal full range of motion of all joints.   Neurologic:     No cranial nerve deficits noted. Station and gait are normal. Plantar reflexes are down-going bilaterally. DTRs are symmetrical throughout. Sensory, motor and coordinative functions appear intact. Skin:     Intact without suspicious lesions or rashes Cervical Nodes:     No lymphadenopathy noted Axillary Nodes:     No palpable lymphadenopathy Inguinal Nodes:     No significant adenopathy Psych:     Cognition and judgment appear intact. Alert and cooperative with normal attention span and concentration. No apparent delusions, illusions, hallucinations    Impression & Recommendations:  Problem # 1:  HYPERTENSION (ICD-401.9) Assessment: Unchanged  Her updated medication list for this problem includes:    Hydrochlorothiazide 25 Mg Tabs (Hydrochlorothiazide) .Marland Kitchen... Take 1 tablet by mouth once a day    Zestril 10 Mg Tabs (Lisinopril) .Marland Kitchen... Take 1 tablet by mouth once a day   Problem # 2:  DEPRESSION (ICD-311) Assessment: Improved  Her updated medication list for this problem includes:    Prozac 40 Mg Caps (Fluoxetine hcl) .Marland Kitchen... Take 1 tablet by mouth once a day   Problem # 3:  RESTLESS LEG SYNDROME, SEVERE (ICD-333.94) Assessment: Improved  Problem # 4:  OSTEOPOROSIS NOS (ICD-733.00) Assessment: Unchanged  Her updated medication list for this problem includes:    Actonel 75 Mg Tabs (Risedronate sodium) .Marland Kitchen... 2 x in 1 week  .Marland Kitchen..one time monthly   Complete Medication List: 1)  Prozac 40 Mg Caps (Fluoxetine hcl) .... Take 1 tablet by mouth once a day 2)  Hydrochlorothiazide 25 Mg Tabs (Hydrochlorothiazide) .... Take 1 tablet by mouth once a day 3)  Actonel 75 Mg Tabs (Risedronate sodium) .... 2 x in 1 week .Marland Kitchen..one time monthly 4)  Aspirin 81 Mg Tbec (Aspirin) .... Qd 5)  Xalatan 0.005 % Soln (Latanoprost) .... At bedtime 6)  Zestril 10 Mg Tabs (Lisinopril) .... Take 1 tablet by mouth once  a day 7)  Mirapex 0.25 Mg Tabs (Pramipexole dihydrochloride) .Marland Kitchen.. 1 tab @ bedtime   Patient Instructions: 1)  today's physical exam is overall normal.  Likely, maintaining a good health habits and keep your weight down as you are doing.  Will continue previous medications.  Will see in the year for follow-up.  Physical examination.  In the meantime if you have any problems feel free to call us. 2)  Please schedule a follow-up appointment in 1 year. 3)  It is important that you exercise regularly at least 20 minutes 5 times a week. If you develop chest pain, have severe difficulty breathing, or feel very tired , stop exercising immediately and seek medical attention. 4)  Take calcium +Vitamin D daily. 5)  Take an Aspirin every day.    Prescriptions: MIRAPEX 0.25 MG  TABS (PRAMIPEXOLE DIHYDROCHLORIDE) 1 tab @ bedtime  #100 x 4   Entered and Authorized by:   Roderick Pee MD   Signed by:   Roderick Pee MD on 10/16/2006   Method used:   Electronically sent to ...       [facility] [address]       [address]       [place], [place]  [address]       Ph: [phone]       Fax: [phone]   RxID:   [id number] ZESTRIL 10 MG  TABS (LISINOPRIL) Take 1 tablet by mouth once a day  #100 x 4   Entered and Authorized by:   Roderick Pee MD   Signed by:   Roderick Pee MD on 10/16/2006   Method used:   Electronically sent to ...       [facility] [address]       [address]       [place], [place]  [address]       Ph: [phone]       Fax: [phone]   RxID:   [id number] ACTONEL 75 MG  TABS (RISEDRONATE SODIUM) 2 x in 1 week .Marland Kitchen..one time monthly  #12 x 4   Entered and Authorized by:   Roderick Pee MD   Signed by:   Roderick Pee MD on 10/16/2006   Method used:   Electronically sent to ...       [facility] [address]       [address]       [place], [place]  [address]       Ph: [phone]       Fax: [phone]   RxID:   [id number] HYDROCHLOROTHIAZIDE 25 MG  TABS (HYDROCHLOROTHIAZIDE) Take 1 tablet by mouth once a day  #100 x 4   Entered and Authorized by:   Roderick Pee MD   Signed by:   Roderick Pee MD on 10/16/2006   Method used:   Electronically sent to ...       [facility] [address]       [address]       [place], [place]  [address]       Ph: [phone]       Fax: [phone]   RxID:   [id number] PROZAC 40 MG  CAPS (FLUOXETINE HCL) Take 1 tablet by mouth once a  day  #100 x 4   Entered and Authorized by:   Roderick Pee MD   Signed by:   Roderick Pee MD on 10/16/2006   Method used:   Electronically sent to ...       [facility] [address]       [address]       [place], [place]  [address]       Ph: [phone]       Fax: [phone]   RxID:   [id number]

## 2010-04-15 NOTE — Progress Notes (Signed)
Summary: actonel refill  Phone Note From Pharmacy   Caller: [facility] [address] [place] [address] Summary of Call: pharmacy called to inform us that the 75 mg of actonel is no longer available.    Follow-up for Phone Call        Pharmacist called 150 available Follow-up by: Kern Reap CMA,  September 29, 2008 11:23 AM    New/Updated Medications: ACTONEL 150 MG TABS (RISEDRONATE SODIUM) take one tab every week Prescriptions: ACTONEL 150 MG TABS (RISEDRONATE SODIUM) take one tab every week  #12 x 0   Entered by:   Kern Reap CMA   Authorized by:   Roderick Pee MD   Signed by:   Kern Reap CMA on 09/29/2008   Method used:   Electronically to        [facility] [address] (retail)       [phone]       [place], [place]  [address]       Ph: [phone]       Fax: [phone]   RxID:   [id number]   Appended Document: actonel refill    Clinical Lists Changes  Medications: Changed medication from ACTONEL 150 MG TABS (RISEDRONATE SODIUM) take one tab every week to ACTONEL 150 MG TABS (RISEDRONATE SODIUM) take one tab every month - Signed Rx of ACTONEL 150 MG TABS (RISEDRONATE SODIUM) take one tab every month;  #12 x 0;  Signed;  Entered by: Kern Reap CMA;  Authorized by: Roderick Pee MD;  Method used: Electronically to [facility] [phone] [place], [place], [place]  [address], Ph: [phone], Fax: [phone]    Prescriptions: ACTONEL 150 MG TABS (RISEDRONATE SODIUM) take one tab every month  #12 x 0   Entered by:   Kern Reap CMA   Authorized by:   Roderick Pee MD   Signed by:   Kern Reap CMA on 09/29/2008   Method used:   Electronically to        [facility] [address] (retail)       [phone]       [place], [place]  [address]       Ph: [phone]       Fax: [phone]   RxID:   [id number]

## 2010-04-15 NOTE — Assessment & Plan Note (Signed)
Summary: cpx/cjr   Vital Signs:  Patient profile:   60 year old female Menstrual status:  hysterectomy Height:      58.75 inches Weight:      127 pounds BMI:     25.96 Temp:     98.2 degrees F oral BP sitting:   102 / 80  (left arm) Cuff size:   regular  Vitals Entered By: Kathrynn Speed CMA (November 19, 2009 1:47 PM) CC: cpx, with lab review, src Is Patient Diabetic? No   CC:  cpx, with lab review, and src.  History of Present Illness: Rhonda Bush is a delightful, 60 year old female, nonsmoker, who comes in today for general physical examination because of a history of hypertension, migraine headaches, depression, osteo- porosis.  Her hypertension is treated with hydrochlorothiazide 25 mg daily, Zestril, 10 mg daily, and also an 81 mg, baby aspirin.  BP 102/80.  Her restless leg syndrome is treated with Mirapex .25 nightly  She is been on Actonel, now 150 mg monthly for over 3 years.  Advised to stop the Actonel.  Prior to that she took hormone replacement for 15 years.  She had a TAH and a BSO, and was on estrogen supplement x 15 years.surgery was done for fibroids.  No cancer  She is on 60 mg of Prozac daily, however, she states is not, working.  She's had a very depressed mood.  She would like to discuss options.  Tetanus 2010, seasonal flu shot.  She will get at work, and a mammography, colonoscopy due in GI, and Uy exam, dental exam, BSE monthly  Preventive Screening-Counseling & Management  Alcohol-Tobacco     Smoking Status: never     Passive Smoke Exposure: no  Current Medications (verified): 1)  Prozac 40 Mg  Caps (Fluoxetine Hcl) .... Take 1 Tablet By Mouth Once A Day 2)  Hydrochlorothiazide 25 Mg  Tabs (Hydrochlorothiazide) .... Take 1 Tablet By Mouth Once A Day 3)  Aspirin 81 Mg  Tbec (Aspirin) .... Qd 4)  Zestril 10 Mg  Tabs (Lisinopril) .... Take 1 Tablet By Mouth Once A Day 5)  Actonel 150 Mg Tabs (Risedronate Sodium) .... Take One Tab Every Month 6)  Prozac  20 Mg Caps (Fluoxetine Hcl) .... Take One Tab Once Daily 7)  Prednisone 20 Mg Tabs (Prednisone) .... Uad 8)  Vicodin Es 7.5-750 Mg Tabs (Hydrocodone-Acetaminophen) .Marland Kitchen.. 1 Tab @ Bedtime As Needed  Allergies (verified): No Known Drug Allergies  Past History:  Past medical, surgical, family and social histories (including risk factors) reviewed, and no changes noted (except as noted below).  Past Medical History: Reviewed history from 09/25/2006 and no changes required. MHA Depression PMS GLAUCOMA Hypertension  Past Surgical History: Reviewed history from 09/25/2006 and no changes required. TAH/BSO- FIBROIDS    CBX1  Family History: Reviewed history from 10/14/2006 and no changes required. Family History of CAD Female 1st degree relative <60 Family History of CAD Female 1st degree relative <50 Family History Diabetes 1st degree relative father MI X3  Social History: Reviewed history from 11/13/2007 and no changes required. Occupation: Married Never Smoked Alcohol use-no Drug use-no Regular exercise-yes  Review of Systems      See HPI  Physical Exam  General:  Well-developed,well-nourished,in no acute distress; alert,appropriate and cooperative throughout examination Head:  Normocephalic and atraumatic without obvious abnormalities. No apparent alopecia or balding. Eyes:  No corneal or conjunctival inflammation noted. EOMI. Perrla. Funduscopic exam benign, without hemorrhages, exudates or papilledema. Vision grossly normal. Ears:  External ear exam shows no significant lesions or deformities.  Otoscopic examination reveals clear canals, tympanic membranes are intact bilaterally without bulging, retraction, inflammation or discharge. Hearing is grossly normal bilaterally. Nose:  External nasal examination shows no deformity or inflammation. Nasal mucosa are pink and moist without lesions or exudates. Mouth:  Oral mucosa and oropharynx without lesions or exudates.  Teeth in  good repair. Neck:  No deformities, masses, or tenderness noted. Chest Wall:  No deformities, masses, or tenderness noted. Breasts:  No mass, nodules, thickening, tenderness, bulging, retraction, inflamation, nipple discharge or skin changes noted.   Lungs:  Normal respiratory effort, chest expands symmetrically. Lungs are clear to auscultation, no crackles or wheezes. Heart:  Normal rate and regular rhythm. S1 and S2 normal without gallop, murmur, click, rub or other extra sounds. Abdomen:  Bowel sounds positive,abdomen soft and non-tender without masses, organomegaly or hernias noted. Rectal:  No external abnormalities noted. Normal sphincter tone. No rectal masses or tenderness. Genitalia:  Pelvic Exam:        External: normal female genitalia without lesions or masses        Vagina: normal without lesions or masses        Cervix: normal without lesions or masses        Adnexa: normal bimanual exam without masses or fullness        Uterus: normal by palpation        Pap smear: not performed Msk:  No deformity or scoliosis noted of thoracic or lumbar spine.   Pulses:  R and L carotid,radial,femoral,dorsalis pedis and posterior tibial pulses are full and equal bilaterally Extremities:  No clubbing, cyanosis, edema, or deformity noted with normal full range of motion of all joints.   Neurologic:  No cranial nerve deficits noted. Station and gait are normal. Plantar reflexes are down-going bilaterally. DTRs are symmetrical throughout. Sensory, motor and coordinative functions appear intact. Skin:  Intact without suspicious lesions or rashes Cervical Nodes:  No lymphadenopathy noted Axillary Nodes:  No palpable lymphadenopathy Inguinal Nodes:  No significant adenopathy Psych:  Cognition and judgment appear intact. Alert and cooperative with normal attention span and concentration. No apparent delusions, illusions, hallucinations   Impression & Recommendations:  Problem # 1:  HYPERTENSION  NEC (ICD-997.91) Assessment Improved  Orders: Prescription Created Electronically [phone])  Problem # 2:  OSTEOPOROSIS NOS (ICD-733.00) Assessment: Improved  The following medications were removed from the medication list:    Actonel 150 Mg Tabs (Risedronate sodium) .Marland Kitchen... Take one tab every month  Orders: Prescription Created Electronically [phone])  Problem # 3:  RESTLESS LEG SYNDROME, SEVERE (ICD-333.94) Assessment: Improved  Orders: Prescription Created Electronically [phone])  Problem # 4:  DEPRESSION (ICD-311) Assessment: Deteriorated  The following medications were removed from the medication list:    Prozac 20 Mg Caps (Fluoxetine hcl) .Marland Kitchen... Take one tab once daily Her updated medication list for this problem includes:    Prozac 40 Mg Caps (Fluoxetine hcl) .Marland Kitchen... 2 poqhs  Orders: Prescription Created Electronically [id number]) EKG w/ Interpretation (93[address])  Complete Medication List: 1)  Prozac 40 Mg Caps (Fluoxetine hcl) .... 2 poqhs 2)  Hydrochlorothiazide 25 Mg Tabs (Hydrochlorothiazide) .... Take 1 tablet by mouth once a day 3)  Aspirin 81 Mg Tbec (Aspirin) .... Qd 4)  Zestril 10 Mg Tabs (Lisinopril) .... Take 1 tablet by mouth once a day 5)  Mirapex 0.25 Mg Tabs (Pramipexole dihydrochloride) .Marland Kitchen.. 1 tab @ bedtime  Patient Instructions: 1)  increase the Prozac to 80 mg daily.  Return in 4 weeks for follow-up 2)  Please schedule a follow-up appointment in 1 year. Prescriptions: MIRAPEX 0.25 MG TABS (PRAMIPEXOLE DIHYDROCHLORIDE) 1 tab @ bedtime  #100 x 3   Entered and Authorized by:   Roderick Pee MD   Signed by:   Roderick Pee MD on 11/19/2009   Method used:   Electronically to        [facility] [address] (retail)       [phone]       [place], [place]  [address]       Ph: [phone]       Fax: [phone]   RxID:   [id number] ZESTRIL 10 MG  TABS (LISINOPRIL) Take 1 tablet by mouth once a day  #100 x 3   Entered  and Authorized by:   Roderick Pee MD   Signed by:   Roderick Pee MD on 11/19/2009   Method used:   Electronically to        [facility] [address] (retail)       [phone]       [place], [place]  [address]       Ph: [phone]       Fax: [phone]   RxID:   [id number] HYDROCHLOROTHIAZIDE 25 MG  TABS (HYDROCHLOROTHIAZIDE) Take 1 tablet by mouth once a day  #100 x 3   Entered and Authorized by:   Roderick Pee MD   Signed by:   Roderick Pee MD on 11/19/2009   Method used:   Electronically to        [facility] [address] (retail)       [phone]       [place], [place]  [address]       Ph: [phone]       Fax: [phone]   RxID:   [id number] PROZAC 40 MG  CAPS (FLUOXETINE HCL) 2 poqhs  #200 x 3   Entered and Authorized by:   Roderick Pee MD   Signed by:   Roderick Pee MD on 11/19/2009   Method used:   Electronically to        [facility] [address] (retail)       [phone]       [place], [place]  [address]       Ph: [phone]       Fax: [phone]   RxID:   [id number]

## 2010-04-15 NOTE — Progress Notes (Signed)
Summary: med refill  Phone Note Refill Request Message from:  Patient on [facility] [address] [phone]  Refills Requested: Medication #1:  HYDROCHLOROTHIAZIDE 25 MG  TABS Take 1 tablet by mouth once a day  Medication #2:  ZESTRIL 10 MG  TABS Take 1 tablet by mouth once a day Initial call taken by: Heron Sabins,  January 08, 2010 9:10 AM    Prescriptions: ZESTRIL 10 MG  TABS (LISINOPRIL) Take 1 tablet by mouth once a day  #100 x 3   Entered by:   Kern Reap CMA (AAMA)   Authorized by:   Roderick Pee MD   Signed by:   Kern Reap CMA (AAMA) on 01/08/2010   Method used:   Electronically to        [facility] [address] (retail)       [phone]       [place], [place]  [address]       Ph: [phone]       Fax: [phone]   RxID:   [id number] HYDROCHLOROTHIAZIDE 25 MG  TABS (HYDROCHLOROTHIAZIDE) Take 1 tablet by mouth once a day  #100 x 3   Entered by:   Kern Reap CMA (AAMA)   Authorized by:   Roderick Pee MD   Signed by:   Kern Reap CMA (AAMA) on 01/08/2010   Method used:   Electronically to        [facility] [address] (retail)       [phone]       [place], [place]  [address]       Ph: [phone]       Fax: [phone]   RxID:   [id number]

## 2010-04-15 NOTE — Letter (Signed)
Summary: Colonoscopy Letter  [name]  [address] [place], [place] [address]   Phone: [phone]  Fax: [phone]      October 14, 2009 MRN: [id number]   [facility] [address] [place], [place]  [address]   Dear Ms. [facility],   According to your medical record, it is time for you to schedule a Colonoscopy. The American Cancer Society recommends this procedure as a method to detect early colon cancer. Patients with a family history of colon cancer, or a personal history of colon polyps or inflammatory bowel disease are at increased risk.  This letter has been generated based on the recommendations made at the time of your procedure. If you feel that in your particular situation this may no longer apply, please contact our office.  Please call our office at [phone] to schedule this appointment or to update your records at your earliest convenience.  Thank you for cooperating with [place] to provide you with the very best care possible.   Sincerely,   Barbette Hair. Arlyce Dice, M.D.  [facility] [phone]

## 2010-04-15 NOTE — Assessment & Plan Note (Signed)
Summary: CPX W/PAP/CCM   Vital Signs:  Patient Profile:   60 Years Old Female Height:     60 inches (149.86 cm) Weight:      135 pounds Temp:     98.4 degrees F oral Pulse rate:   56 / minute Pulse rhythm:   regular BP sitting:   104 / 74  (left arm) Cuff size:   regular  Vitals Entered By: Kern Reap CMA (November 13, 2007 4:22 PM)                 Chief Complaint:  cpx.  History of Present Illness: Rhonda Bush is a 60 year old female, who comes in today for evaluation of depression, fluid retention, hypertension, osteoporosis, and restless leg syndrome.  Her depression was treated with Prozac 40 mg daily with good results.  No side effects from medication.  Her hypertension is treated with HCTZ 25 mg daily, along with Zestril, 10 mg daily.  Her blood pressure is 10 4/74.  No side effects from medication.  She takes Actonel one tablet monthly also calcium, vitamin D, for osteo- porosis.  She takes Mirapex .25 mg at bedtime for restless leg syndrome.  She also takes an 81 mg in.  Last colonoscopy was 2008.    Current Allergies: No known allergies   Past Medical History:    Reviewed history from 09/25/2006 and no changes required:       MHA       Depression       PMS       GLAUCOMA       Hypertension   Family History:    Reviewed history from 10/14/2006 and no changes required:       Family History of CAD Female 1st degree relative <60       Family History of CAD Female 1st degree relative <50       Family History Diabetes 1st degree relative       father MI X3  Social History:    Reviewed history and no changes required:       Occupation:       Married       Never Smoked       Alcohol use-no       Drug use-no       Regular exercise-yes   Risk Factors:  Tobacco use:  never Drug use:  no Alcohol use:  no Exercise:  yes   Review of Systems      See HPI   Physical Exam  General:     Well-developed,well-nourished,in no acute distress;  alert,appropriate and cooperative throughout examination Head:     Normocephalic and atraumatic without obvious abnormalities. No apparent alopecia or balding. Eyes:     No corneal or conjunctival inflammation noted. EOMI. Perrla. Funduscopic exam benign, without hemorrhages, exudates or papilledema. Vision grossly normal. Ears:     External ear exam shows no significant lesions or deformities.  Otoscopic examination reveals clear canals, tympanic membranes are intact bilaterally without bulging, retraction, inflammation or discharge. Hearing is grossly normal bilaterally. Nose:     External nasal examination shows no deformity or inflammation. Nasal mucosa are pink and moist without lesions or exudates. Mouth:     Oral mucosa and oropharynx without lesions or exudates.  Teeth in good repair. Neck:     No deformities, masses, or tenderness noted. Chest Wall:     No deformities, masses, or tenderness noted. Breasts:     No mass, nodules, thickening, tenderness,  bulging, retraction, inflamation, nipple discharge or skin changes noted.   Lungs:     Normal respiratory effort, chest expands symmetrically. Lungs are clear to auscultation, no crackles or wheezes. Heart:     Normal rate and regular rhythm. S1 and S2 normal without gallop, murmur, click, rub or other extra sounds. Abdomen:     Bowel sounds positive,abdomen soft and non-tender without masses, organomegaly or hernias noted. Rectal:     No external abnormalities noted. Normal sphincter tone. No rectal masses or tenderness. Genitalia:     Pelvic Exam:        External: normal female genitalia without lesions or masses        Vagina: normal without lesions or masses        Cervix: normal without lesions or masses        Adnexa: normal bimanual exam without masses or fullness        Uterus: normal by palpation        Pap smear: not performed Msk:     No deformity or scoliosis noted of thoracic or lumbar spine.   Pulses:     R and  L carotid,radial,femoral,dorsalis pedis and posterior tibial pulses are full and equal bilaterally Extremities:     No clubbing, cyanosis, edema, or deformity noted with normal full range of motion of all joints.   Neurologic:     No cranial nerve deficits noted. Station and gait are normal. Plantar reflexes are down-going bilaterally. DTRs are symmetrical throughout. Sensory, motor and coordinative functions appear intact. Skin:     Intact without suspicious lesions or rashes Cervical Nodes:     No lymphadenopathy noted Axillary Nodes:     No palpable lymphadenopathy Inguinal Nodes:     No significant adenopathy Psych:     Cognition and judgment appear intact. Alert and cooperative with normal attention span and concentration. No apparent delusions, illusions, hallucinations    Impression & Recommendations:  Problem # 1:  OSTEOPOROSIS NOS (ICD-733.00) Assessment: Improved  Her updated medication list for this problem includes:    Actonel 75 Mg Tabs (Risedronate sodium) .Marland Kitchen... 2 x in 1 week .Marland Kitchen..one time monthly   Problem # 2:  RESTLESS LEG SYNDROME, SEVERE (ICD-333.94) Assessment: Improved  Problem # 3:  HYPERTENSION NEC (ICD-997.91) Assessment: Improved  Orders: EKG w/ Interpretation (93000)   Complete Medication List: 1)  Prozac 40 Mg Caps (Fluoxetine hcl) .... Take 1 tablet by mouth once a day 2)  Hydrochlorothiazide 25 Mg Tabs (Hydrochlorothiazide) .... Take 1 tablet by mouth once a day 3)  Actonel 75 Mg Tabs (Risedronate sodium) .... 2 x in 1 week .Marland Kitchen..one time monthly 4)  Aspirin 81 Mg Tbec (Aspirin) .... Qd 5)  Zestril 10 Mg Tabs (Lisinopril) .... Take 1 tablet by mouth once a day 6)  Mirapex 0.25 Mg Tabs (Pramipexole dihydrochloride) .Marland Kitchen.. 1 tab @ bedtime   Patient Instructions: 1)  Please schedule a follow-up appointment in 1 year. 2)  It is important that you exercise regularly at least 20 minutes 5 times a week. If you develop chest pain, have severe difficulty  breathing, or feel very tired , stop exercising immediately and seek medical attention. 3)  Schedule your mammogram. 4)  Take calcium +Vitamin D daily. 5)  Take an Aspirin every day.   Prescriptions: MIRAPEX 0.25 MG  TABS (PRAMIPEXOLE DIHYDROCHLORIDE) 1 tab @ bedtime  #100 x 4   Entered and Authorized by:   Roderick Pee MD   Signed by:   Tinnie Gens  Shawnie Dapper MD on 11/13/2007   Method used:   Electronically to        [facility] [address] (retail)       [phone]       [place], [place]  [address]       Ph: [phone]       Fax: [phone]   RxID:   [id number] ZESTRIL 10 MG  TABS (LISINOPRIL) Take 1 tablet by mouth once a day  #100 x 4   Entered and Authorized by:   Roderick Pee MD   Signed by:   Roderick Pee MD on 11/13/2007   Method used:   Electronically to        [facility] [address] (retail)       [phone]       [place], [place]  [address]       Ph: [phone]       Fax: [phone]   RxID:   [id number] ACTONEL 75 MG  TABS (RISEDRONATE SODIUM) 2 x in 1 week .Marland Kitchen..one time monthly  #12 x 4   Entered and Authorized by:   Roderick Pee MD   Signed by:   Roderick Pee MD on 11/13/2007   Method used:   Electronically to        [facility] [address] (retail)       [phone]       [place], [place]  [address]       Ph: [phone]       Fax: [phone]   RxID:   [id number] HYDROCHLOROTHIAZIDE 25 MG  TABS (HYDROCHLOROTHIAZIDE) Take 1 tablet by mouth once a day  #100 x 4   Entered and Authorized by:   Roderick Pee MD   Signed by:   Roderick Pee MD on 11/13/2007   Method used:   Electronically to        [facility] [address] (retail)       [phone]       [place], [place]  [address]       Ph: [phone]       Fax: [phone]   RxID:   [id number] PROZAC 40 MG  CAPS (FLUOXETINE HCL) Take 1  tablet by mouth once a day  #100 x 4   Entered and Authorized by:   Roderick Pee MD   Signed by:   Roderick Pee MD on 11/13/2007   Method used:   Electronically to        [facility] [address] (retail)       [phone]       [place], [place]  [address]       Ph: [phone]       Fax: [phone]   RxID:   [id number]  ]

## 2010-04-15 NOTE — Assessment & Plan Note (Signed)
Summary: NECK/SHOUDLER BLADE/ELBOW PAIN/NJR   Vital Signs:  Patient Profile:   60 Years Old Female Height:     59 inches (149.86 cm) Weight:      134 pounds (60.91 kg) Temp:     98.5 degrees F (36.94 degrees C) oral BP sitting:   120 / 60  (left arm)  Pt. in pain?   yes    Location:   shoulder    Intensity:   5-7    Type:       burning  Vitals Entered By: Arcola Jansky, RN (December 18, 2006 4:13 PM)                  Chief Complaint:  PAIN IN L SHOULDER THAT RUNS UP NECK AND DOWN ARM TO THE ELBOW and AND TO SHOULDERBLADE.  History of Present Illness: Rhonda Bush is a 60 year old female comes in today for evaluation of left shoulder pain.  She does not recall the exact date started.  It also radiates up into the posterior part of her scapular area.  She has no history of trauma to her left shoulder and she is right-handed.  Never any previous history of injuries.  Musculoskeletal, neurologic, review of systems negative  Acute Visit History:      She denies abdominal pain, chest pain, constipation, cough, diarrhea, earache, eye symptoms, fever, genitourinary symptoms, headache, musculoskeletal symptoms, nasal discharge, nausea, rash, sinus problems, sore throat, and vomiting.         Current Allergies (reviewed today): No known allergies   Past Medical History:    Reviewed history from 09/25/2006 and no changes required:       MHA       Depression       PMS       GLAUCOMA       Hypertension     Review of Systems      See HPI   Physical Exam  General:     Well-developed,well-nourished,in no acute distress; alert,appropriate and cooperative throughout examination Head:     Normocephalic and atraumatic without obvious abnormalities. No apparent alopecia or balding. Eyes:     No corneal or conjunctival inflammation noted. EOMI. Perrla. Funduscopic exam benign, without hemorrhages, exudates or papilledema. Vision grossly normal. Ears:     External ear exam  shows no significant lesions or deformities.  Otoscopic examination reveals clear canals, tympanic membranes are intact bilaterally without bulging, retraction, inflammation or discharge. Hearing is grossly normal bilaterally. Nose:     External nasal examination shows no deformity or inflammation. Nasal mucosa are pink and moist without lesions or exudates. Msk:     No deformity or scoliosis noted of thoracic or lumbar spine.  full range of motion of left shoulder.  She has some tenderness over the anterior bursa also at the insertion.  The biceps tendon. Neurologic:     No cranial nerve deficits noted. Station and gait are normal. Plantar reflexes are down-going bilaterally. DTRs are symmetrical throughout. Sensory, motor and coordinative functions appear intact.    Impression & Recommendations:  Problem # 1:  BURSITIS, LEFT SHOULDER (ICD-726.10) Assessment: New  Complete Medication List: 1)  Prozac 40 Mg Caps (Fluoxetine hcl) .... Take 1 tablet by mouth once a day 2)  Hydrochlorothiazide 25 Mg Tabs (Hydrochlorothiazide) .... Take 1 tablet by mouth once a day 3)  Actonel 75 Mg Tabs (Risedronate sodium) .... 2 x in 1 week .Marland Kitchen..one time monthly 4)  Aspirin 81 Mg Tbec (Aspirin) .... Qd 5)  Xalatan 0.005 % Soln (Latanoprost) .... At bedtime 6)  Zestril 10 Mg Tabs (Lisinopril) .... Take 1 tablet by mouth once a day 7)  Mirapex 0.25 Mg Tabs (Pramipexole dihydrochloride) .Marland Kitchen.. 1 tab @ bedtime   Patient Instructions: 1)  take Motrin, 600 mg 3 times a day for left shoulder pain.  It may do this for a week or two.  If after that.  He continues to still hurt then let us know.  If the pain would not resolve will need to have you see an orthopedist for further evaluation.    ]

## 2010-04-15 NOTE — Assessment & Plan Note (Signed)
Summary: ha/njr   Vital Signs:  Patient profile:   60 year old female Menstrual status:  hysterectomy Weight:      129 pounds Temp:     98.2 degrees F oral BP sitting:   102 / 80  (left arm) CC: headache pain 2 weeks   CC:  headache pain 2 weeks.  History of Present Illness: Rhonda Bush is a 60 year old female, who comes in today with a headache for two weeks.  She said a history of episodic migraines, but never cluster.  Patchy as a dull....... a 3........ headache constant.  Neurologic review of systems negative  Allergies: No Known Drug Allergies  Past History:  Past medical, surgical, family and social histories (including risk factors) reviewed for relevance to current acute and chronic problems.  Past Medical History: Reviewed history from 09/25/2006 and no changes required. MHA Depression PMS GLAUCOMA Hypertension  Past Surgical History: Reviewed history from 09/25/2006 and no changes required. TAH/BSO- FIBROIDS    CBX1  Family History: Reviewed history from 10/14/2006 and no changes required. Family History of CAD Female 1st degree relative <60 Family History of CAD Female 1st degree relative <50 Family History Diabetes 1st degree relative father MI X3  Social History: Reviewed history from 11/13/2007 and no changes required. Occupation: Married Never Smoked Alcohol use-no Drug use-no Regular exercise-yes  Review of Systems      See HPI  Physical Exam  General:  Well-developed,well-nourished,in no acute distress; alert,appropriate and cooperative throughout examination   Problems:  Medical Problems Added: 1)  Dx of Cluster Headache Syndrome Unspecified  (ICD-339.00)  Impression & Recommendations:  Problem # 1:  CLUSTER HEADACHE SYNDROME UNSPECIFIED (ICD-339.00) Assessment New  Complete Medication List: 1)  Prozac 40 Mg Caps (Fluoxetine hcl) .... Take 1 tablet by mouth once a day 2)  Hydrochlorothiazide 25 Mg Tabs (Hydrochlorothiazide) ....  Take 1 tablet by mouth once a day 3)  Aspirin 81 Mg Tbec (Aspirin) .... Qd 4)  Zestril 10 Mg Tabs (Lisinopril) .... Take 1 tablet by mouth once a day 5)  Mirapex 0.25 Mg Tabs (Pramipexole dihydrochloride) .Marland Kitchen.. 1 tab @ bedtime 6)  Actonel 150 Mg Tabs (Risedronate sodium) .... Take one tab every month 7)  Prozac 20 Mg Caps (Fluoxetine hcl) .... Take one tab once daily 8)  Prednisone 20 Mg Tabs (Prednisone) .... Uad 9)  Vicodin Es 7.5-750 Mg Tabs (Hydrocodone-acetaminophen) .Marland Kitchen.. 1 tab @ bedtime as needed  Patient Instructions: 1)  begin prednisone to take two tablets now then two tablets every morning until the headache stops then taper by taking one tablet x 3 days, one half tablet x 3 days, then one half tablet Monday, Wednesday, Friday, for a two week taper Prescriptions: VICODIN ES 7.5-750 MG TABS (HYDROCODONE-ACETAMINOPHEN) 1 tab @ bedtime as needed  #20 x 0   Entered and Authorized by:   Roderick Pee MD   Signed by:   Roderick Pee MD on 07/23/2009   Method used:   Print then Give to Patient   RxID:   [id number] PREDNISONE 20 MG TABS (PREDNISONE) UAD  #50 x 1   Entered and Authorized by:   Roderick Pee MD   Signed by:   Roderick Pee MD on 07/23/2009   Method used:   Electronically to        [facility]  [address] [phone][facility] (retail)       [address]       [place]  [place], [place]  [id number]       Ph: [phone] or [phone]       Fax: [phone]   RxID:   [id number]

## 2010-06-09 ENCOUNTER — Encounter: Payer: Self-pay | Admitting: Family Medicine

## 2010-06-10 ENCOUNTER — Ambulatory Visit (INDEPENDENT_AMBULATORY_CARE_PROVIDER_SITE_OTHER): Payer: Managed Care, Other (non HMO) | Admitting: Family Medicine

## 2010-06-10 ENCOUNTER — Encounter: Payer: Self-pay | Admitting: Family Medicine

## 2010-06-10 VITALS — BP 120/84 | Temp 98.4°F | Ht <= 58 in | Wt 139.0 lb

## 2010-06-10 DIAGNOSIS — H81312 Aural vertigo, left ear: Secondary | ICD-10-CM

## 2010-06-10 DIAGNOSIS — H81319 Aural vertigo, unspecified ear: Secondary | ICD-10-CM

## 2010-06-10 DIAGNOSIS — R141 Gas pain: Secondary | ICD-10-CM

## 2010-06-10 DIAGNOSIS — IMO0001 Reserved for inherently not codable concepts without codable children: Secondary | ICD-10-CM

## 2010-06-10 NOTE — Progress Notes (Signed)
  Subjective:    Patient ID: Rhonda Bush, female    DOB: 1950-08-23, 60 y.o.   MRN: [id number]  HPI Rhonda Bush is a delightful, 60 year old female, who comes in today for evaluation of two problems.  She states for the past 3 years.  She said increasing gas.  She's got no history of any other GI problems specifically, no symptoms related to gallbladder disease.  She also has a sense of rocking for about a week or two.  No true vertigo   Review of Systems GI and ENT review of systems otherwise negative    Objective:   Physical Exam    Well-developed well-nourished, female, in no acute distress.  HEENT were negative.  Neck was supple.  No adenopathy.  Thyroid not enlarged.  Chest clear to auscultation.  Cardiac exam negative down exam negative    Assessment & Plan:  Gas,,,,,,,, most likely lactase deficiency,,,,,,,, lactose-free diet,,,,,,,,, GI consult if symptoms persist.  Benign positional vertigo,,,,,,,,,,,,,, reassured

## 2010-06-10 NOTE — Patient Instructions (Signed)
Go on a lactose free diet for two weeks.  If after two weeks.  She symptoms persist.  Call [phone] and get an appointment to see a gastroenterologist.  The lightheadedness is called vertigo.  This is a benign condition that resolves spontaneously

## 2010-07-30 NOTE — Op Note (Signed)
NAMEVETTA, COUZENS NO.:  [id number]   MEDICAL RECORD NO.:  [id number]          PATIENT TYPE:  AMB   LOCATION:  ENDO                         FACILITY:  [facility]   PHYSICIAN:  Anselmo Rod, M.D.  DATE OF BIRTH:  12-27-1950   DATE OF PROCEDURE:  06/26/2006  DATE OF DISCHARGE:                               OPERATIVE REPORT   PROCEDURE PERFORMED:  Screening colonoscopy.   ENDOSCOPIST:  Anselmo Rod, M.D.   INSTRUMENT USED:  Pentax video colonoscope.   INDICATIONS FOR PROCEDURE:  A 60 year old African American female  undergoing screening colonoscopy to rule out colonic polyps, masses,  etc. The patient has a history of tubal adenomas removed in the past.   PREPROCEDURE PREPARATION:  Informed consent was procured from the  patient.  The patient was fasted for 8 hours prior to the procedure and  prepped with a gallon of Nulytely the night prior to the procedure. The  risks and benefits of the procedure including a 10% miss rate of cancer  and polyps were discussed with the patient as well.   PREPROCEDURE PHYSICAL:  The patient had stable vital signs.  Neck  supple.  Chest clear to auscultation.  S1 and S2 regular.  Abdomen soft  with normal bowel sounds.   DESCRIPTION OF PROCEDURE:  The patient was placed in the left lateral  decubitus position, sedated with 100 mcg of Fentanyl and 10 mg of Versed  given intravenously in slow incremental doses.  Once the patient was  adequately sedated and maintained on low-flow oxygen and continuous  cardiac monitoring the Pentax video colonoscope was advanced from the  rectum to the cecum.  The appendiceal orifice and ileocecal valve were  clearly visualized and photographed.  The terminal ileum appeared  healthy without lesions.  No masses, polyps, erosions, ulcerations or  diverticula were seen.  There was some residual stool in the right  colon.  Multiple washes were done.  Retroflexion in the rectum revealed  no  abnormalities.  There was no evidence of diverticulosis.  The patient  tolerated the procedure well without complications.  The terminal ileum  appeared healthy without lesions.   IMPRESSION:  1. Normal colonoscopy of the terminal ileum, appendiceal orifice and      ileocecal valve identified and photographed.  2. No masses, polyps, erosions, ulcerations or diverticula seen.  3. Some residual stool in the colon.  Multiple washings done.   RECOMMENDATIONS:  1. Considering the fact that the patient had tubal adenomas removed in      the past repeat colonoscopy has been      planned in the next 5 years.  However, if she has any abnormal      symptoms in the interim she should contact the office immediately      for further recommendations.  2. Outpatient follow-up as need arises in the future.      Anselmo Rod, M.D.  Electronically Signed     JNM/MEDQ  D:  06/26/2006  T:  06/27/2006  Job:  [id number]   cc:   Gaetano Hawthorne. Lily Peer, M.D.

## 2010-07-30 NOTE — Op Note (Signed)
NAME:  Rhonda Bush, Rhonda Bush                         ACCOUNT NO.:  [id number]   MEDICAL RECORD NO.:  [id number]                   PATIENT TYPE:  AMB   LOCATION:  ENDO                                 FACILITY:  [facility]   PHYSICIAN:  Anselmo Rod, M.D.               DATE OF BIRTH:  21-Jan-1951   DATE OF PROCEDURE:  05/24/2002  DATE OF DISCHARGE:                                 OPERATIVE REPORT   PROCEDURE PERFORMED:  Colonoscopy with snare polypectomy x2.   ENDOSCOPIST:  Anselmo Rod, M.D.   INSTRUMENT USED:  Olympus video colonoscope, (adjustable pediatric scope).   INDICATIONS FOR PROCEDURE:  A 60 year old [place] American female with a  history of anemia and rectal bleeding.  Hemoglobin of 9.3 g/dl.  The  patient's EGD has been normal.  Rule out colonic polyps, masses, etc.   PREPROCEDURE PREPARATION:  Informed consent was procured from the patient.  The patient was fasted for eight hours prior to the procedure and prepped  with a bottle of Gatorade and MiraLax the night prior to the procedure.   PREPROCEDURE PHYSICAL:  VITAL SIGNS: The patient had stable vital signs.  NECK: Supple.  CHEST: Clear to auscultation.  S1 and S2 regular.  ABDOMEN: Soft with normal bowel sounds.   DESCRIPTION OF PROCEDURE:  The patient was placed in the left lateral  decubitus position, sedated with 70 mg of Demerol and 7.5 mg of Versed  intravenously.  Once the patient was adequately sedated and maintained on  low flow oxygen and continuous cardiac monitoring, the Olympus video  colonoscope was advanced from the rectum to the cecum with difficulty.  There was a large amount of liquid stool in the colon.  A small sessile  polyp was snared from the rectum at 5 cm.  Another small sessile polyp was  removed from the cardiac flexure.  Small internal hemorrhoids were seen on  retroflexion in the rectum.  The rest of the colonic mucosa appeared healthy  and without lesions however, secondary to residual  stool in the colon, small  lesions could have been missed.  No large masses, polyps or diverticula were  seen.   IMPRESSION:  1. Two polyps removed from the colon, (see description above).  2. Small nonbleeding internal hemorrhoids.  3. Significant amount of residual stool in the colon.  Small lesions could     have been missed.    RECOMMENDATIONS:  1. Await pathology results.  2. Avoid all nonsteroidals including aspirin for now.  3. Outpatient follow up in the next two weeks for further recommendations.                                                 Anselmo Rod, M.D.    JNM/MEDQ  D:  05/25/2002  T:  05/26/2002  Job:  [id number]   cc:   Gaetano Hawthorne. Lily Peer, M.D.  [address], [address]  [place]  [place] [address]  Fax: [phone]

## 2010-11-22 ENCOUNTER — Other Ambulatory Visit: Payer: Self-pay | Admitting: Internal Medicine

## 2010-11-22 DIAGNOSIS — Z1231 Encounter for screening mammogram for malignant neoplasm of breast: Secondary | ICD-10-CM

## 2010-12-17 ENCOUNTER — Ambulatory Visit
Admission: RE | Admit: 2010-12-17 | Discharge: 2010-12-17 | Disposition: A | Discharge status: Home / Self Care | Payer: Managed Care, Other (non HMO) | Source: Ambulatory Visit | Attending: Internal Medicine | Admitting: Internal Medicine

## 2010-12-17 DIAGNOSIS — Z1231 Encounter for screening mammogram for malignant neoplasm of breast: Secondary | ICD-10-CM

## 2010-12-28 ENCOUNTER — Other Ambulatory Visit: Payer: Managed Care, Other (non HMO)

## 2011-01-04 ENCOUNTER — Encounter: Payer: Managed Care, Other (non HMO) | Admitting: Family Medicine

## 2011-04-18 ENCOUNTER — Telehealth: Payer: Self-pay | Admitting: *Deleted

## 2011-04-18 NOTE — Telephone Encounter (Signed)
LM for pt to returncall

## 2011-04-18 NOTE — Telephone Encounter (Signed)
Patient does not want to schedule her colonoscopy at this time. Will call back at a later date

## 2011-11-15 ENCOUNTER — Other Ambulatory Visit: Payer: Self-pay | Admitting: Internal Medicine

## 2011-11-15 DIAGNOSIS — Z1231 Encounter for screening mammogram for malignant neoplasm of breast: Secondary | ICD-10-CM

## 2011-11-28 ENCOUNTER — Other Ambulatory Visit (HOSPITAL_COMMUNITY)
Admission: RE | Admit: 2011-11-28 | Discharge: 2011-11-28 | Disposition: A | Discharge status: Home / Self Care | Payer: Managed Care, Other (non HMO) | Source: Ambulatory Visit | Attending: Internal Medicine | Admitting: Internal Medicine

## 2011-11-28 ENCOUNTER — Other Ambulatory Visit: Payer: Self-pay | Admitting: Internal Medicine

## 2011-11-28 DIAGNOSIS — Z01419 Encounter for gynecological examination (general) (routine) without abnormal findings: Secondary | ICD-10-CM | POA: Insufficient documentation

## 2011-11-28 DIAGNOSIS — Z1151 Encounter for screening for human papillomavirus (HPV): Secondary | ICD-10-CM | POA: Insufficient documentation

## 2011-12-20 ENCOUNTER — Ambulatory Visit: Payer: Managed Care, Other (non HMO)

## 2011-12-22 ENCOUNTER — Ambulatory Visit
Admission: RE | Admit: 2011-12-22 | Discharge: 2011-12-22 | Disposition: A | Discharge status: Home / Self Care | Payer: Managed Care, Other (non HMO) | Source: Ambulatory Visit | Attending: Internal Medicine | Admitting: Internal Medicine

## 2011-12-22 DIAGNOSIS — Z1231 Encounter for screening mammogram for malignant neoplasm of breast: Secondary | ICD-10-CM

## 2012-11-26 ENCOUNTER — Other Ambulatory Visit: Payer: Self-pay

## 2012-11-26 DIAGNOSIS — Z1231 Encounter for screening mammogram for malignant neoplasm of breast: Secondary | ICD-10-CM

## 2012-12-20 ENCOUNTER — Other Ambulatory Visit: Payer: Self-pay | Admitting: Family Medicine

## 2012-12-20 DIAGNOSIS — R1011 Right upper quadrant pain: Secondary | ICD-10-CM

## 2012-12-24 ENCOUNTER — Other Ambulatory Visit: Payer: Managed Care, Other (non HMO)

## 2012-12-25 ENCOUNTER — Ambulatory Visit
Admission: RE | Admit: 2012-12-25 | Discharge: 2012-12-25 | Disposition: A | Discharge status: Home / Self Care | Payer: Managed Care, Other (non HMO) | Source: Ambulatory Visit

## 2012-12-25 DIAGNOSIS — Z1231 Encounter for screening mammogram for malignant neoplasm of breast: Secondary | ICD-10-CM

## 2012-12-27 ENCOUNTER — Ambulatory Visit
Admission: RE | Admit: 2012-12-27 | Discharge: 2012-12-27 | Disposition: A | Discharge status: Home / Self Care | Payer: Managed Care, Other (non HMO) | Source: Ambulatory Visit | Attending: Family Medicine | Admitting: Family Medicine

## 2012-12-27 DIAGNOSIS — R1011 Right upper quadrant pain: Secondary | ICD-10-CM

## 2013-01-23 ENCOUNTER — Encounter (HOSPITAL_COMMUNITY): Payer: Self-pay | Admitting: *Deleted

## 2013-01-24 ENCOUNTER — Encounter (HOSPITAL_COMMUNITY): Payer: Self-pay | Admitting: Pharmacy Technician

## 2013-02-12 ENCOUNTER — Other Ambulatory Visit: Payer: Self-pay | Admitting: Gastroenterology

## 2013-02-12 NOTE — Anesthesia Preprocedure Evaluation (Addendum)
Anesthesia Evaluation  Patient identified by MRN, date of birth, ID band Patient awake    Reviewed: Allergy & Precautions, H&P , NPO status , Patient's Chart, lab work & pertinent test results  Airway Mallampati: II TM Distance: >3 FB Neck ROM: full    Dental no notable dental hx. (+) Teeth Intact and Dental Advisory Given   Pulmonary asthma ,  Mild asthma breath sounds clear to auscultation  Pulmonary exam normal       Cardiovascular Exercise Tolerance: Good hypertension, Pt. on medications Rhythm:regular Rate:Normal     Neuro/Psych glaucoma negative neurological ROS  negative psych ROS   GI/Hepatic negative GI ROS, Neg liver ROS,   Endo/Other  negative endocrine ROS  Renal/GU negative Renal ROS  negative genitourinary   Musculoskeletal  (+) Arthritis -, Rheumatoid disorders,    Abdominal   Peds  Hematology negative hematology ROS (+)   Anesthesia Other Findings   Reproductive/Obstetrics negative OB ROS                          Anesthesia Physical Anesthesia Plan  ASA: II  Anesthesia Plan: MAC   Post-op Pain Management:    Induction:   Airway Management Planned: Simple Face Mask  Additional Equipment:   Intra-op Plan:   Post-operative Plan:   Informed Consent: I have reviewed the patients History and Physical, chart, labs and discussed the procedure including the risks, benefits and alternatives for the proposed anesthesia with the patient or authorized representative who has indicated his/her understanding and acceptance.   Dental Advisory Given  Plan Discussed with: CRNA and Surgeon  Anesthesia Plan Comments:         Anesthesia Quick Evaluation

## 2013-02-13 ENCOUNTER — Ambulatory Visit (HOSPITAL_COMMUNITY)
Admission: RE | Admit: 2013-02-13 | Discharge: 2013-02-13 | Disposition: A | Discharge status: Home / Self Care | Payer: Managed Care, Other (non HMO) | Source: Ambulatory Visit | Attending: Gastroenterology | Admitting: Gastroenterology

## 2013-02-13 ENCOUNTER — Encounter (HOSPITAL_COMMUNITY)
Admission: RE | Disposition: A | Discharge status: Home / Self Care | Payer: Self-pay | Source: Ambulatory Visit | Attending: Gastroenterology

## 2013-02-13 ENCOUNTER — Encounter (HOSPITAL_COMMUNITY): Payer: Self-pay

## 2013-02-13 ENCOUNTER — Ambulatory Visit (HOSPITAL_COMMUNITY): Payer: Managed Care, Other (non HMO) | Admitting: Anesthesiology

## 2013-02-13 ENCOUNTER — Encounter (HOSPITAL_COMMUNITY): Payer: Managed Care, Other (non HMO) | Admitting: Anesthesiology

## 2013-02-13 DIAGNOSIS — I10 Essential (primary) hypertension: Secondary | ICD-10-CM | POA: Insufficient documentation

## 2013-02-13 DIAGNOSIS — K838 Other specified diseases of biliary tract: Secondary | ICD-10-CM | POA: Insufficient documentation

## 2013-02-13 DIAGNOSIS — R1011 Right upper quadrant pain: Secondary | ICD-10-CM | POA: Insufficient documentation

## 2013-02-13 DIAGNOSIS — J45909 Unspecified asthma, uncomplicated: Secondary | ICD-10-CM | POA: Insufficient documentation

## 2013-02-13 DIAGNOSIS — M069 Rheumatoid arthritis, unspecified: Secondary | ICD-10-CM | POA: Insufficient documentation

## 2013-02-13 HISTORY — PX: EUS: SHX5427

## 2013-02-13 HISTORY — DX: Unspecified osteoarthritis, unspecified site: M19.90

## 2013-02-13 HISTORY — DX: Unspecified asthma, uncomplicated: J45.909

## 2013-02-13 HISTORY — PX: ESOPHAGOGASTRODUODENOSCOPY (EGD) WITH PROPOFOL: SHX5813

## 2013-02-13 SURGERY — ESOPHAGOGASTRODUODENOSCOPY (EGD) WITH PROPOFOL
Anesthesia: Monitor Anesthesia Care

## 2013-02-13 MED ORDER — PROPOFOL INFUSION 10 MG/ML OPTIME
INTRAVENOUS | Status: DC | PRN
  Administered 2013-02-13: 60 ug/kg/min via INTRAVENOUS

## 2013-02-13 MED ORDER — BUTAMBEN-TETRACAINE-BENZOCAINE 2-2-14 % EX AERO
INHALATION_SPRAY | CUTANEOUS | Status: DC | PRN
  Administered 2013-02-13: 2 via TOPICAL

## 2013-02-13 MED ORDER — PROPOFOL 10 MG/ML IV BOLUS
INTRAVENOUS | Status: AC
  Filled 2013-02-13: qty 20

## 2013-02-13 MED ORDER — MIDAZOLAM HCL 2 MG/2ML IJ SOLN
INTRAMUSCULAR | Status: AC
  Filled 2013-02-13: qty 2

## 2013-02-13 MED ORDER — SODIUM CHLORIDE 0.9 % IV SOLN: INTRAVENOUS | Status: DC

## 2013-02-13 MED ORDER — LACTATED RINGERS IV SOLN
INTRAVENOUS | Status: DC
  Administered 2013-02-13: 07:00:00 via INTRAVENOUS

## 2013-02-13 MED ORDER — MIDAZOLAM HCL 5 MG/5ML IJ SOLN
INTRAMUSCULAR | Status: DC | PRN
  Administered 2013-02-13: 2 mg via INTRAVENOUS

## 2013-02-13 MED ORDER — KETAMINE HCL 50 MG/ML IJ SOLN
INTRAMUSCULAR | Status: DC | PRN
  Administered 2013-02-13: 25 mg via INTRAMUSCULAR

## 2013-02-13 SURGICAL SUPPLY — 14 items

## 2013-02-13 NOTE — Transfer of Care (Signed)
Immediate Anesthesia Transfer of Care Note  Patient: Rhonda Bush  Procedure(s) Performed: Procedure(s): ESOPHAGOGASTRODUODENOSCOPY (EGD) WITH PROPOFOL (N/A) ESOPHAGEAL ENDOSCOPIC ULTRASOUND (EUS) RADIAL (N/A)  Patient Location: PACU  Anesthesia Type:MAC  Level of Consciousness: sedated  Airway & Oxygen Therapy: Patient Spontanous Breathing and Patient connected to nasal cannula oxygen  Post-op Assessment: Report given to PACU RN and Post -op Vital signs reviewed and stable  Post vital signs: Reviewed and stable  Complications: No apparent anesthesia complications

## 2013-02-13 NOTE — Anesthesia Postprocedure Evaluation (Signed)
  Anesthesia Post-op Note  Patient: Rhonda Bush  Procedure(s) Performed: Procedure(s) (LRB): ESOPHAGOGASTRODUODENOSCOPY (EGD) WITH PROPOFOL (N/A) ESOPHAGEAL ENDOSCOPIC ULTRASOUND (EUS) RADIAL (N/A)  Patient Location: PACU  Anesthesia Type: MAC  Level of Consciousness: awake and alert   Airway and Oxygen Therapy: Patient Spontanous Breathing  Post-op Pain: mild  Post-op Assessment: Post-op Vital signs reviewed, Patient's Cardiovascular Status Stable, Respiratory Function Stable, Patent Airway and No signs of Nausea or vomiting  Last Vitals:  Filed Vitals:   02/13/13 0930  BP: 152/85  Pulse:   Temp:   Resp: 21    Post-op Vital Signs: stable   Complications: No apparent anesthesia complications

## 2013-02-13 NOTE — H&P (Signed)
Patient interval history reviewed.  Patient examined again.  There has been no change from documented H/P dated 01/17/13 (scanned into chart from our office) except as documented above.  Assessment:  1.  Abdominal pain, right upper quadrant. 2.  Dilated bile duct.  Plan:  1.  Esophagogastroduodenoscopy and upper endoscopic ultrasound. 2.  Risks (bleeding, infection, bowel perforation that could require surgery, sedation-related changes in cardiopulmonary systems), benefits (identification and possible treatment of source of symptoms, exclusion of certain causes of symptoms), and alternatives (watchful waiting, radiographic imaging studies, empiric medical treatment) of upper endoscopy and upper endoscopic ultrasound (EGD + EUS) were explained to patient/family in detail and patient wishes to proceed.

## 2013-02-13 NOTE — Op Note (Signed)
[facility] [address] [place], [address]   ENDOSCOPIC ULTRASOUND PROCEDURE REPORT  PATIENT: Rhonda Bush, Rhonda Bush  MR#: [id number] BIRTHDATE: 04-08-1950  GENDER: Female ENDOSCOPIST: Willis Modena, MD REFERRED BY:  Elvera Lennox, PA-C. PROCEDURE DATE:  02/13/2013 PROCEDURE:   Upper EUS ASA CLASS:      Class II INDICATIONS:   1.  right upper quadrant abdominal pain, dilated bile duct. MEDICATIONS: MAC sedation, administered by CRNA and Cetacaine spray x 2  DESCRIPTION OF PROCEDURE:   After the risks benefits and alternatives of the procedure were  explained, informed consent was obtained. The patient was then placed in the left, lateral, decubitus postion and IV sedation was administered. Throughout the procedure, the patients blood pressure, pulse and oxygen saturations were monitored continuously.  Under direct visualization, the     endoscope was introduced through the mouth and advanced to the second portion of the duodenum .  Water was used as necessary to provide an acoustic interface.  Upon completion of the imaging, water was removed and the patient was sent to the recovery room in satisfactory condition.    FINDINGS:      EGD:  Normal esophagus, stomach, pylorus, and duodenum to the second portion. EUS:  Normal pancreas; no cyst, mass, or features of chronic pancreatitis.  Ampulla normal via endoscopic ultrasound. Normal-appearing gallbladder without wall thickening, sludge or stones.  Bile duct 8mm with low cystic duct take-off; no evidence of bile duct wall thickening and no bile duct stones seen.  IMPRESSION:     Normal EGD and EUS.  Patient's abdominal pain has improved with antispasmodics.  RECOMMENDATIONS:     1.  Watch for potential complications of procedure. 2.  Continue dicyclomine as-needed. 3.  Follow-up with [facility] on as-needed basis.   _______________________________ Rosalie DoctorWillis Modena, MD 02/13/2013 9:19  AM   CC:

## 2013-02-14 ENCOUNTER — Encounter (HOSPITAL_COMMUNITY): Payer: Self-pay | Admitting: Gastroenterology

## 2013-11-19 ENCOUNTER — Other Ambulatory Visit: Payer: Self-pay

## 2013-11-19 DIAGNOSIS — Z1231 Encounter for screening mammogram for malignant neoplasm of breast: Secondary | ICD-10-CM

## 2013-12-26 ENCOUNTER — Ambulatory Visit
Admission: RE | Admit: 2013-12-26 | Discharge: 2013-12-26 | Disposition: A | Discharge status: Home / Self Care | Payer: Managed Care, Other (non HMO) | Source: Ambulatory Visit

## 2013-12-26 DIAGNOSIS — Z1231 Encounter for screening mammogram for malignant neoplasm of breast: Secondary | ICD-10-CM

## 2014-11-11 NOTE — Addendum Note (Signed)
Addended by: Arta Silence on: 11/11/2014 11:14 AM   Modules accepted: Orders

## 2014-11-21 ENCOUNTER — Other Ambulatory Visit: Payer: Self-pay

## 2014-11-21 DIAGNOSIS — Z1231 Encounter for screening mammogram for malignant neoplasm of breast: Secondary | ICD-10-CM

## 2014-12-29 ENCOUNTER — Ambulatory Visit
Admission: RE | Admit: 2014-12-29 | Discharge: 2014-12-29 | Disposition: A | Discharge status: Home / Self Care | Payer: Managed Care, Other (non HMO) | Source: Ambulatory Visit

## 2014-12-29 DIAGNOSIS — Z1231 Encounter for screening mammogram for malignant neoplasm of breast: Secondary | ICD-10-CM

## 2015-07-30 ENCOUNTER — Ambulatory Visit: Payer: Managed Care, Other (non HMO) | Admitting: Podiatry

## 2015-11-07 ENCOUNTER — Other Ambulatory Visit: Payer: Self-pay | Admitting: Family Medicine

## 2015-11-07 DIAGNOSIS — R109 Unspecified abdominal pain: Secondary | ICD-10-CM

## 2015-11-20 ENCOUNTER — Other Ambulatory Visit: Payer: Self-pay | Admitting: Family Medicine

## 2015-11-20 DIAGNOSIS — Z1231 Encounter for screening mammogram for malignant neoplasm of breast: Secondary | ICD-10-CM

## 2015-11-23 ENCOUNTER — Ambulatory Visit
Admission: RE | Admit: 2015-11-23 | Discharge: 2015-11-23 | Disposition: A | Discharge status: Home / Self Care | Payer: Managed Care, Other (non HMO) | Source: Ambulatory Visit | Attending: Family Medicine | Admitting: Family Medicine

## 2015-11-23 DIAGNOSIS — R109 Unspecified abdominal pain: Secondary | ICD-10-CM

## 2015-12-10 ENCOUNTER — Other Ambulatory Visit: Payer: Self-pay | Admitting: Gastroenterology

## 2015-12-10 ENCOUNTER — Other Ambulatory Visit (HOSPITAL_COMMUNITY): Payer: Self-pay | Admitting: Gastroenterology

## 2015-12-10 DIAGNOSIS — R1084 Generalized abdominal pain: Secondary | ICD-10-CM

## 2015-12-16 ENCOUNTER — Ambulatory Visit (HOSPITAL_COMMUNITY)
Admission: RE | Admit: 2015-12-16 | Discharge: 2015-12-16 | Disposition: A | Discharge status: Home / Self Care | Payer: Managed Care, Other (non HMO) | Source: Ambulatory Visit | Attending: Gastroenterology | Admitting: Gastroenterology

## 2015-12-16 DIAGNOSIS — R109 Unspecified abdominal pain: Secondary | ICD-10-CM | POA: Diagnosis not present

## 2015-12-16 DIAGNOSIS — R1084 Generalized abdominal pain: Secondary | ICD-10-CM

## 2015-12-16 MED ORDER — TECHNETIUM TC 99M MEBROFENIN IV KIT
5.0000 | PACK | Freq: Once | INTRAVENOUS | Status: AC | PRN
  Administered 2015-12-16: 5 via INTRAVENOUS

## 2015-12-30 ENCOUNTER — Ambulatory Visit: Payer: Managed Care, Other (non HMO)

## 2015-12-31 ENCOUNTER — Ambulatory Visit
Admission: RE | Admit: 2015-12-31 | Discharge: 2015-12-31 | Disposition: A | Discharge status: Home / Self Care | Payer: Managed Care, Other (non HMO) | Source: Ambulatory Visit | Attending: Family Medicine | Admitting: Family Medicine

## 2015-12-31 DIAGNOSIS — Z1231 Encounter for screening mammogram for malignant neoplasm of breast: Secondary | ICD-10-CM

## 2016-03-28 DIAGNOSIS — H40053 Ocular hypertension, bilateral: Secondary | ICD-10-CM | POA: Diagnosis not present

## 2016-03-28 DIAGNOSIS — H401231 Low-tension glaucoma, bilateral, mild stage: Secondary | ICD-10-CM | POA: Diagnosis not present

## 2016-04-25 DIAGNOSIS — H1013 Acute atopic conjunctivitis, bilateral: Secondary | ICD-10-CM | POA: Diagnosis not present

## 2016-04-25 DIAGNOSIS — H401211 Low-tension glaucoma, right eye, mild stage: Secondary | ICD-10-CM | POA: Diagnosis not present

## 2016-04-25 DIAGNOSIS — H02403 Unspecified ptosis of bilateral eyelids: Secondary | ICD-10-CM | POA: Diagnosis not present

## 2016-04-25 DIAGNOSIS — H401231 Low-tension glaucoma, bilateral, mild stage: Secondary | ICD-10-CM | POA: Diagnosis not present

## 2016-04-25 DIAGNOSIS — H401221 Low-tension glaucoma, left eye, mild stage: Secondary | ICD-10-CM | POA: Diagnosis not present

## 2016-04-25 DIAGNOSIS — H04123 Dry eye syndrome of bilateral lacrimal glands: Secondary | ICD-10-CM | POA: Diagnosis not present

## 2016-05-24 DIAGNOSIS — R202 Paresthesia of skin: Secondary | ICD-10-CM | POA: Diagnosis not present

## 2016-05-24 DIAGNOSIS — R140 Abdominal distension (gaseous): Secondary | ICD-10-CM | POA: Diagnosis not present

## 2016-05-31 DIAGNOSIS — M8589 Other specified disorders of bone density and structure, multiple sites: Secondary | ICD-10-CM | POA: Diagnosis not present

## 2016-05-31 DIAGNOSIS — E559 Vitamin D deficiency, unspecified: Secondary | ICD-10-CM | POA: Diagnosis not present

## 2016-05-31 DIAGNOSIS — E210 Primary hyperparathyroidism: Secondary | ICD-10-CM | POA: Diagnosis not present

## 2016-06-06 DIAGNOSIS — H402230 Chronic angle-closure glaucoma, bilateral, stage unspecified: Secondary | ICD-10-CM | POA: Diagnosis not present

## 2016-06-06 DIAGNOSIS — H04123 Dry eye syndrome of bilateral lacrimal glands: Secondary | ICD-10-CM | POA: Diagnosis not present

## 2016-06-06 DIAGNOSIS — H401231 Low-tension glaucoma, bilateral, mild stage: Secondary | ICD-10-CM | POA: Diagnosis not present

## 2016-06-06 DIAGNOSIS — H40053 Ocular hypertension, bilateral: Secondary | ICD-10-CM | POA: Diagnosis not present

## 2016-06-07 DIAGNOSIS — E559 Vitamin D deficiency, unspecified: Secondary | ICD-10-CM | POA: Diagnosis not present

## 2016-06-07 DIAGNOSIS — M810 Age-related osteoporosis without current pathological fracture: Secondary | ICD-10-CM | POA: Diagnosis not present

## 2016-06-07 DIAGNOSIS — E210 Primary hyperparathyroidism: Secondary | ICD-10-CM | POA: Diagnosis not present

## 2016-06-16 DIAGNOSIS — M810 Age-related osteoporosis without current pathological fracture: Secondary | ICD-10-CM | POA: Diagnosis not present

## 2016-06-16 DIAGNOSIS — Z79899 Other long term (current) drug therapy: Secondary | ICD-10-CM | POA: Diagnosis not present

## 2016-06-16 DIAGNOSIS — M0579 Rheumatoid arthritis with rheumatoid factor of multiple sites without organ or systems involvement: Secondary | ICD-10-CM | POA: Diagnosis not present

## 2016-06-16 DIAGNOSIS — M0589 Other rheumatoid arthritis with rheumatoid factor of multiple sites: Secondary | ICD-10-CM | POA: Diagnosis not present

## 2016-07-14 DIAGNOSIS — H04123 Dry eye syndrome of bilateral lacrimal glands: Secondary | ICD-10-CM | POA: Diagnosis not present

## 2016-07-14 DIAGNOSIS — H402230 Chronic angle-closure glaucoma, bilateral, stage unspecified: Secondary | ICD-10-CM | POA: Diagnosis not present

## 2016-07-14 DIAGNOSIS — H401211 Low-tension glaucoma, right eye, mild stage: Secondary | ICD-10-CM | POA: Diagnosis not present

## 2016-07-14 DIAGNOSIS — H401221 Low-tension glaucoma, left eye, mild stage: Secondary | ICD-10-CM | POA: Diagnosis not present

## 2016-07-14 DIAGNOSIS — H402210 Chronic angle-closure glaucoma, right eye, stage unspecified: Secondary | ICD-10-CM | POA: Diagnosis not present

## 2016-07-14 DIAGNOSIS — H40053 Ocular hypertension, bilateral: Secondary | ICD-10-CM | POA: Diagnosis not present

## 2016-07-14 DIAGNOSIS — H401231 Low-tension glaucoma, bilateral, mild stage: Secondary | ICD-10-CM | POA: Diagnosis not present

## 2016-07-14 DIAGNOSIS — H402220 Chronic angle-closure glaucoma, left eye, stage unspecified: Secondary | ICD-10-CM | POA: Diagnosis not present

## 2016-08-12 DIAGNOSIS — F330 Major depressive disorder, recurrent, mild: Secondary | ICD-10-CM | POA: Diagnosis not present

## 2016-08-12 DIAGNOSIS — K219 Gastro-esophageal reflux disease without esophagitis: Secondary | ICD-10-CM | POA: Diagnosis not present

## 2016-08-12 DIAGNOSIS — I10 Essential (primary) hypertension: Secondary | ICD-10-CM | POA: Diagnosis not present

## 2016-09-15 DIAGNOSIS — M0579 Rheumatoid arthritis with rheumatoid factor of multiple sites without organ or systems involvement: Secondary | ICD-10-CM | POA: Diagnosis not present

## 2016-09-15 DIAGNOSIS — M810 Age-related osteoporosis without current pathological fracture: Secondary | ICD-10-CM | POA: Diagnosis not present

## 2016-09-15 DIAGNOSIS — E785 Hyperlipidemia, unspecified: Secondary | ICD-10-CM | POA: Diagnosis not present

## 2016-09-15 DIAGNOSIS — Z79899 Other long term (current) drug therapy: Secondary | ICD-10-CM | POA: Diagnosis not present

## 2016-09-19 DIAGNOSIS — H401211 Low-tension glaucoma, right eye, mild stage: Secondary | ICD-10-CM | POA: Diagnosis not present

## 2016-10-03 DIAGNOSIS — H401221 Low-tension glaucoma, left eye, mild stage: Secondary | ICD-10-CM | POA: Diagnosis not present

## 2016-11-16 DIAGNOSIS — H40053 Ocular hypertension, bilateral: Secondary | ICD-10-CM | POA: Diagnosis not present

## 2016-11-16 DIAGNOSIS — H04123 Dry eye syndrome of bilateral lacrimal glands: Secondary | ICD-10-CM | POA: Diagnosis not present

## 2016-11-16 DIAGNOSIS — H401231 Low-tension glaucoma, bilateral, mild stage: Secondary | ICD-10-CM | POA: Diagnosis not present

## 2016-11-16 DIAGNOSIS — H402230 Chronic angle-closure glaucoma, bilateral, stage unspecified: Secondary | ICD-10-CM | POA: Diagnosis not present

## 2016-11-23 ENCOUNTER — Other Ambulatory Visit: Payer: Self-pay | Admitting: Family Medicine

## 2016-11-23 DIAGNOSIS — Z1231 Encounter for screening mammogram for malignant neoplasm of breast: Secondary | ICD-10-CM

## 2016-12-20 DIAGNOSIS — M0579 Rheumatoid arthritis with rheumatoid factor of multiple sites without organ or systems involvement: Secondary | ICD-10-CM | POA: Diagnosis not present

## 2016-12-20 DIAGNOSIS — M810 Age-related osteoporosis without current pathological fracture: Secondary | ICD-10-CM | POA: Diagnosis not present

## 2016-12-20 DIAGNOSIS — M79673 Pain in unspecified foot: Secondary | ICD-10-CM | POA: Diagnosis not present

## 2016-12-20 DIAGNOSIS — Z79899 Other long term (current) drug therapy: Secondary | ICD-10-CM | POA: Diagnosis not present

## 2017-01-02 ENCOUNTER — Ambulatory Visit
Admission: RE | Admit: 2017-01-02 | Discharge: 2017-01-02 | Disposition: A | Discharge status: Home / Self Care | Payer: BLUE CROSS/BLUE SHIELD | Source: Ambulatory Visit | Attending: Family Medicine | Admitting: Family Medicine

## 2017-01-02 DIAGNOSIS — Z1231 Encounter for screening mammogram for malignant neoplasm of breast: Secondary | ICD-10-CM

## 2017-01-19 DIAGNOSIS — H04123 Dry eye syndrome of bilateral lacrimal glands: Secondary | ICD-10-CM | POA: Diagnosis not present

## 2017-01-19 DIAGNOSIS — H401231 Low-tension glaucoma, bilateral, mild stage: Secondary | ICD-10-CM | POA: Diagnosis not present

## 2017-01-19 DIAGNOSIS — H40053 Ocular hypertension, bilateral: Secondary | ICD-10-CM | POA: Diagnosis not present

## 2017-01-19 DIAGNOSIS — H402230 Chronic angle-closure glaucoma, bilateral, stage unspecified: Secondary | ICD-10-CM | POA: Diagnosis not present

## 2017-02-14 DIAGNOSIS — K219 Gastro-esophageal reflux disease without esophagitis: Secondary | ICD-10-CM | POA: Diagnosis not present

## 2017-02-14 DIAGNOSIS — Z0000 Encounter for general adult medical examination without abnormal findings: Secondary | ICD-10-CM | POA: Diagnosis not present

## 2017-02-14 DIAGNOSIS — M810 Age-related osteoporosis without current pathological fracture: Secondary | ICD-10-CM | POA: Diagnosis not present

## 2017-02-14 DIAGNOSIS — I10 Essential (primary) hypertension: Secondary | ICD-10-CM | POA: Diagnosis not present

## 2017-02-14 DIAGNOSIS — F320 Major depressive disorder, single episode, mild: Secondary | ICD-10-CM | POA: Diagnosis not present

## 2017-03-01 DIAGNOSIS — H35033 Hypertensive retinopathy, bilateral: Secondary | ICD-10-CM | POA: Diagnosis not present

## 2017-03-01 DIAGNOSIS — H3509 Other intraretinal microvascular abnormalities: Secondary | ICD-10-CM | POA: Diagnosis not present

## 2017-03-01 DIAGNOSIS — H402230 Chronic angle-closure glaucoma, bilateral, stage unspecified: Secondary | ICD-10-CM | POA: Diagnosis not present

## 2017-03-01 DIAGNOSIS — H401231 Low-tension glaucoma, bilateral, mild stage: Secondary | ICD-10-CM | POA: Diagnosis not present

## 2017-03-29 DIAGNOSIS — Z79899 Other long term (current) drug therapy: Secondary | ICD-10-CM | POA: Diagnosis not present

## 2017-03-29 DIAGNOSIS — M0579 Rheumatoid arthritis with rheumatoid factor of multiple sites without organ or systems involvement: Secondary | ICD-10-CM | POA: Diagnosis not present

## 2017-03-29 DIAGNOSIS — M810 Age-related osteoporosis without current pathological fracture: Secondary | ICD-10-CM | POA: Diagnosis not present

## 2017-03-29 DIAGNOSIS — D72819 Decreased white blood cell count, unspecified: Secondary | ICD-10-CM | POA: Diagnosis not present

## 2017-04-26 DIAGNOSIS — H401221 Low-tension glaucoma, left eye, mild stage: Secondary | ICD-10-CM | POA: Diagnosis not present

## 2017-04-26 DIAGNOSIS — H402210 Chronic angle-closure glaucoma, right eye, stage unspecified: Secondary | ICD-10-CM | POA: Diagnosis not present

## 2017-04-26 DIAGNOSIS — H402220 Chronic angle-closure glaucoma, left eye, stage unspecified: Secondary | ICD-10-CM | POA: Diagnosis not present

## 2017-04-26 DIAGNOSIS — H401211 Low-tension glaucoma, right eye, mild stage: Secondary | ICD-10-CM | POA: Diagnosis not present

## 2017-05-16 DIAGNOSIS — K648 Other hemorrhoids: Secondary | ICD-10-CM | POA: Diagnosis not present

## 2017-06-14 DIAGNOSIS — H402230 Chronic angle-closure glaucoma, bilateral, stage unspecified: Secondary | ICD-10-CM | POA: Diagnosis not present

## 2017-06-14 DIAGNOSIS — H401231 Low-tension glaucoma, bilateral, mild stage: Secondary | ICD-10-CM | POA: Diagnosis not present

## 2017-06-14 DIAGNOSIS — H04123 Dry eye syndrome of bilateral lacrimal glands: Secondary | ICD-10-CM | POA: Diagnosis not present

## 2017-06-14 DIAGNOSIS — H40053 Ocular hypertension, bilateral: Secondary | ICD-10-CM | POA: Diagnosis not present

## 2017-06-22 DIAGNOSIS — E559 Vitamin D deficiency, unspecified: Secondary | ICD-10-CM | POA: Diagnosis not present

## 2017-06-22 DIAGNOSIS — E210 Primary hyperparathyroidism: Secondary | ICD-10-CM | POA: Diagnosis not present

## 2017-06-29 DIAGNOSIS — I10 Essential (primary) hypertension: Secondary | ICD-10-CM | POA: Diagnosis not present

## 2017-06-29 DIAGNOSIS — M810 Age-related osteoporosis without current pathological fracture: Secondary | ICD-10-CM | POA: Diagnosis not present

## 2017-06-29 DIAGNOSIS — E559 Vitamin D deficiency, unspecified: Secondary | ICD-10-CM | POA: Diagnosis not present

## 2017-06-29 DIAGNOSIS — E210 Primary hyperparathyroidism: Secondary | ICD-10-CM | POA: Diagnosis not present

## 2017-07-18 DIAGNOSIS — Z79899 Other long term (current) drug therapy: Secondary | ICD-10-CM | POA: Diagnosis not present

## 2017-07-18 DIAGNOSIS — M0579 Rheumatoid arthritis with rheumatoid factor of multiple sites without organ or systems involvement: Secondary | ICD-10-CM | POA: Diagnosis not present

## 2017-07-18 DIAGNOSIS — D72819 Decreased white blood cell count, unspecified: Secondary | ICD-10-CM | POA: Diagnosis not present

## 2017-07-18 DIAGNOSIS — M810 Age-related osteoporosis without current pathological fracture: Secondary | ICD-10-CM | POA: Diagnosis not present

## 2017-08-15 DIAGNOSIS — I10 Essential (primary) hypertension: Secondary | ICD-10-CM | POA: Diagnosis not present

## 2017-08-15 DIAGNOSIS — K625 Hemorrhage of anus and rectum: Secondary | ICD-10-CM | POA: Diagnosis not present

## 2017-08-15 DIAGNOSIS — F325 Major depressive disorder, single episode, in full remission: Secondary | ICD-10-CM | POA: Diagnosis not present

## 2017-08-31 DIAGNOSIS — H401231 Low-tension glaucoma, bilateral, mild stage: Secondary | ICD-10-CM | POA: Diagnosis not present

## 2017-08-31 DIAGNOSIS — H40053 Ocular hypertension, bilateral: Secondary | ICD-10-CM | POA: Diagnosis not present

## 2017-08-31 DIAGNOSIS — H402230 Chronic angle-closure glaucoma, bilateral, stage unspecified: Secondary | ICD-10-CM | POA: Diagnosis not present

## 2017-10-18 DIAGNOSIS — M0579 Rheumatoid arthritis with rheumatoid factor of multiple sites without organ or systems involvement: Secondary | ICD-10-CM | POA: Diagnosis not present

## 2017-10-18 DIAGNOSIS — D72819 Decreased white blood cell count, unspecified: Secondary | ICD-10-CM | POA: Diagnosis not present

## 2017-10-18 DIAGNOSIS — Z79899 Other long term (current) drug therapy: Secondary | ICD-10-CM | POA: Diagnosis not present

## 2017-10-18 DIAGNOSIS — M810 Age-related osteoporosis without current pathological fracture: Secondary | ICD-10-CM | POA: Diagnosis not present

## 2017-11-17 DIAGNOSIS — R5383 Other fatigue: Secondary | ICD-10-CM | POA: Diagnosis not present

## 2017-11-17 DIAGNOSIS — R159 Full incontinence of feces: Secondary | ICD-10-CM | POA: Diagnosis not present

## 2017-11-17 DIAGNOSIS — K625 Hemorrhage of anus and rectum: Secondary | ICD-10-CM | POA: Diagnosis not present

## 2017-11-17 DIAGNOSIS — Z1211 Encounter for screening for malignant neoplasm of colon: Secondary | ICD-10-CM | POA: Diagnosis not present

## 2017-11-17 DIAGNOSIS — Z79899 Other long term (current) drug therapy: Secondary | ICD-10-CM | POA: Diagnosis not present

## 2017-11-17 DIAGNOSIS — R1084 Generalized abdominal pain: Secondary | ICD-10-CM | POA: Diagnosis not present

## 2017-11-23 ENCOUNTER — Other Ambulatory Visit: Payer: Self-pay | Admitting: Family Medicine

## 2017-11-23 DIAGNOSIS — Z1231 Encounter for screening mammogram for malignant neoplasm of breast: Secondary | ICD-10-CM

## 2017-11-28 DIAGNOSIS — G43009 Migraine without aura, not intractable, without status migrainosus: Secondary | ICD-10-CM | POA: Diagnosis not present

## 2017-11-28 DIAGNOSIS — I10 Essential (primary) hypertension: Secondary | ICD-10-CM | POA: Diagnosis not present

## 2017-12-04 DIAGNOSIS — M0579 Rheumatoid arthritis with rheumatoid factor of multiple sites without organ or systems involvement: Secondary | ICD-10-CM | POA: Diagnosis not present

## 2017-12-13 DIAGNOSIS — K648 Other hemorrhoids: Secondary | ICD-10-CM | POA: Diagnosis not present

## 2017-12-13 DIAGNOSIS — Z01818 Encounter for other preprocedural examination: Secondary | ICD-10-CM | POA: Diagnosis not present

## 2017-12-13 DIAGNOSIS — K635 Polyp of colon: Secondary | ICD-10-CM | POA: Diagnosis not present

## 2017-12-13 DIAGNOSIS — Z1211 Encounter for screening for malignant neoplasm of colon: Secondary | ICD-10-CM | POA: Diagnosis not present

## 2017-12-14 DIAGNOSIS — K635 Polyp of colon: Secondary | ICD-10-CM | POA: Diagnosis not present

## 2018-01-03 DIAGNOSIS — K648 Other hemorrhoids: Secondary | ICD-10-CM | POA: Diagnosis not present

## 2018-01-03 DIAGNOSIS — K625 Hemorrhage of anus and rectum: Secondary | ICD-10-CM | POA: Diagnosis not present

## 2018-01-03 DIAGNOSIS — K635 Polyp of colon: Secondary | ICD-10-CM | POA: Diagnosis not present

## 2018-01-04 ENCOUNTER — Ambulatory Visit
Admission: RE | Admit: 2018-01-04 | Discharge: 2018-01-04 | Disposition: A | Discharge status: Home / Self Care | Payer: BLUE CROSS/BLUE SHIELD | Source: Ambulatory Visit | Attending: Family Medicine | Admitting: Family Medicine

## 2018-01-04 DIAGNOSIS — Z1231 Encounter for screening mammogram for malignant neoplasm of breast: Secondary | ICD-10-CM

## 2018-01-10 DIAGNOSIS — I10 Essential (primary) hypertension: Secondary | ICD-10-CM | POA: Diagnosis not present

## 2018-01-18 DIAGNOSIS — M810 Age-related osteoporosis without current pathological fracture: Secondary | ICD-10-CM | POA: Diagnosis not present

## 2018-01-18 DIAGNOSIS — Z79899 Other long term (current) drug therapy: Secondary | ICD-10-CM | POA: Diagnosis not present

## 2018-01-18 DIAGNOSIS — M0579 Rheumatoid arthritis with rheumatoid factor of multiple sites without organ or systems involvement: Secondary | ICD-10-CM | POA: Diagnosis not present

## 2018-01-18 DIAGNOSIS — D72819 Decreased white blood cell count, unspecified: Secondary | ICD-10-CM | POA: Diagnosis not present

## 2018-01-22 DIAGNOSIS — E210 Primary hyperparathyroidism: Secondary | ICD-10-CM | POA: Diagnosis not present

## 2018-01-22 DIAGNOSIS — E559 Vitamin D deficiency, unspecified: Secondary | ICD-10-CM | POA: Diagnosis not present

## 2018-02-02 DIAGNOSIS — I10 Essential (primary) hypertension: Secondary | ICD-10-CM | POA: Diagnosis not present

## 2018-02-27 DIAGNOSIS — I10 Essential (primary) hypertension: Secondary | ICD-10-CM | POA: Diagnosis not present

## 2018-02-27 DIAGNOSIS — F320 Major depressive disorder, single episode, mild: Secondary | ICD-10-CM | POA: Diagnosis not present

## 2018-02-27 DIAGNOSIS — G43009 Migraine without aura, not intractable, without status migrainosus: Secondary | ICD-10-CM | POA: Diagnosis not present

## 2018-02-27 DIAGNOSIS — Z0000 Encounter for general adult medical examination without abnormal findings: Secondary | ICD-10-CM | POA: Diagnosis not present

## 2018-02-27 DIAGNOSIS — Z23 Encounter for immunization: Secondary | ICD-10-CM | POA: Diagnosis not present

## 2018-03-05 DIAGNOSIS — Z01818 Encounter for other preprocedural examination: Secondary | ICD-10-CM | POA: Diagnosis not present

## 2018-03-05 DIAGNOSIS — K648 Other hemorrhoids: Secondary | ICD-10-CM | POA: Diagnosis not present

## 2018-03-05 DIAGNOSIS — K625 Hemorrhage of anus and rectum: Secondary | ICD-10-CM | POA: Diagnosis not present

## 2018-03-08 DIAGNOSIS — H3509 Other intraretinal microvascular abnormalities: Secondary | ICD-10-CM | POA: Diagnosis not present

## 2018-03-08 DIAGNOSIS — H402230 Chronic angle-closure glaucoma, bilateral, stage unspecified: Secondary | ICD-10-CM | POA: Diagnosis not present

## 2018-03-08 DIAGNOSIS — H401231 Low-tension glaucoma, bilateral, mild stage: Secondary | ICD-10-CM | POA: Diagnosis not present

## 2018-03-08 DIAGNOSIS — H35033 Hypertensive retinopathy, bilateral: Secondary | ICD-10-CM | POA: Diagnosis not present

## 2018-04-04 DIAGNOSIS — Z800 Family history of malignant neoplasm of digestive organs: Secondary | ICD-10-CM | POA: Diagnosis not present

## 2018-04-04 DIAGNOSIS — K648 Other hemorrhoids: Secondary | ICD-10-CM | POA: Diagnosis not present

## 2018-04-04 DIAGNOSIS — K625 Hemorrhage of anus and rectum: Secondary | ICD-10-CM | POA: Diagnosis not present

## 2018-04-05 DIAGNOSIS — H401211 Low-tension glaucoma, right eye, mild stage: Secondary | ICD-10-CM | POA: Diagnosis not present

## 2018-04-19 DIAGNOSIS — H401221 Low-tension glaucoma, left eye, mild stage: Secondary | ICD-10-CM | POA: Diagnosis not present

## 2018-04-23 DIAGNOSIS — D72819 Decreased white blood cell count, unspecified: Secondary | ICD-10-CM | POA: Diagnosis not present

## 2018-04-23 DIAGNOSIS — M810 Age-related osteoporosis without current pathological fracture: Secondary | ICD-10-CM | POA: Diagnosis not present

## 2018-04-23 DIAGNOSIS — Z79899 Other long term (current) drug therapy: Secondary | ICD-10-CM | POA: Diagnosis not present

## 2018-04-23 DIAGNOSIS — M0579 Rheumatoid arthritis with rheumatoid factor of multiple sites without organ or systems involvement: Secondary | ICD-10-CM | POA: Diagnosis not present

## 2018-06-25 DIAGNOSIS — H1013 Acute atopic conjunctivitis, bilateral: Secondary | ICD-10-CM | POA: Diagnosis not present

## 2018-06-25 DIAGNOSIS — H402231 Chronic angle-closure glaucoma, bilateral, mild stage: Secondary | ICD-10-CM | POA: Diagnosis not present

## 2018-06-25 DIAGNOSIS — H04123 Dry eye syndrome of bilateral lacrimal glands: Secondary | ICD-10-CM | POA: Diagnosis not present

## 2018-07-03 DIAGNOSIS — E210 Primary hyperparathyroidism: Secondary | ICD-10-CM | POA: Diagnosis not present

## 2018-07-03 DIAGNOSIS — M8589 Other specified disorders of bone density and structure, multiple sites: Secondary | ICD-10-CM | POA: Diagnosis not present

## 2018-07-03 DIAGNOSIS — E559 Vitamin D deficiency, unspecified: Secondary | ICD-10-CM | POA: Diagnosis not present

## 2018-07-03 DIAGNOSIS — M810 Age-related osteoporosis without current pathological fracture: Secondary | ICD-10-CM | POA: Diagnosis not present

## 2018-07-09 DIAGNOSIS — M542 Cervicalgia: Secondary | ICD-10-CM | POA: Diagnosis not present

## 2018-07-09 DIAGNOSIS — E559 Vitamin D deficiency, unspecified: Secondary | ICD-10-CM | POA: Diagnosis not present

## 2018-07-09 DIAGNOSIS — R202 Paresthesia of skin: Secondary | ICD-10-CM | POA: Diagnosis not present

## 2018-07-09 DIAGNOSIS — I10 Essential (primary) hypertension: Secondary | ICD-10-CM | POA: Diagnosis not present

## 2018-07-09 DIAGNOSIS — M810 Age-related osteoporosis without current pathological fracture: Secondary | ICD-10-CM | POA: Diagnosis not present

## 2018-07-09 DIAGNOSIS — E210 Primary hyperparathyroidism: Secondary | ICD-10-CM | POA: Diagnosis not present

## 2018-07-27 DIAGNOSIS — M810 Age-related osteoporosis without current pathological fracture: Secondary | ICD-10-CM | POA: Diagnosis not present

## 2018-07-27 DIAGNOSIS — Z79899 Other long term (current) drug therapy: Secondary | ICD-10-CM | POA: Diagnosis not present

## 2018-07-27 DIAGNOSIS — M0579 Rheumatoid arthritis with rheumatoid factor of multiple sites without organ or systems involvement: Secondary | ICD-10-CM | POA: Diagnosis not present

## 2018-07-27 DIAGNOSIS — D72819 Decreased white blood cell count, unspecified: Secondary | ICD-10-CM | POA: Diagnosis not present

## 2018-08-02 DIAGNOSIS — E210 Primary hyperparathyroidism: Secondary | ICD-10-CM | POA: Diagnosis not present

## 2018-08-15 ENCOUNTER — Other Ambulatory Visit: Payer: Self-pay | Admitting: Endocrinology

## 2018-08-15 ENCOUNTER — Other Ambulatory Visit (HOSPITAL_COMMUNITY): Payer: Self-pay | Admitting: Endocrinology

## 2018-08-15 ENCOUNTER — Other Ambulatory Visit: Payer: Self-pay | Admitting: Family Medicine

## 2018-08-15 ENCOUNTER — Other Ambulatory Visit (HOSPITAL_COMMUNITY): Payer: Self-pay | Admitting: Family Medicine

## 2018-08-15 DIAGNOSIS — M810 Age-related osteoporosis without current pathological fracture: Secondary | ICD-10-CM | POA: Diagnosis not present

## 2018-08-15 DIAGNOSIS — E210 Primary hyperparathyroidism: Secondary | ICD-10-CM

## 2018-08-15 DIAGNOSIS — E559 Vitamin D deficiency, unspecified: Secondary | ICD-10-CM | POA: Diagnosis not present

## 2018-08-15 DIAGNOSIS — E049 Nontoxic goiter, unspecified: Secondary | ICD-10-CM

## 2018-08-15 DIAGNOSIS — I10 Essential (primary) hypertension: Secondary | ICD-10-CM | POA: Diagnosis not present

## 2018-08-24 ENCOUNTER — Other Ambulatory Visit: Payer: Self-pay

## 2018-08-24 ENCOUNTER — Ambulatory Visit (HOSPITAL_COMMUNITY): Admission: RE | Admit: 2018-08-24 | Payer: BC Managed Care – PPO | Source: Ambulatory Visit

## 2018-08-24 ENCOUNTER — Encounter (HOSPITAL_COMMUNITY): Payer: Self-pay

## 2018-08-24 ENCOUNTER — Ambulatory Visit (HOSPITAL_COMMUNITY)
Admission: RE | Admit: 2018-08-24 | Discharge: 2018-08-24 | Disposition: A | Discharge status: Home / Self Care | Payer: BC Managed Care – PPO | Source: Ambulatory Visit | Attending: Endocrinology | Admitting: Endocrinology

## 2018-08-24 DIAGNOSIS — E049 Nontoxic goiter, unspecified: Secondary | ICD-10-CM

## 2018-08-24 DIAGNOSIS — E210 Primary hyperparathyroidism: Secondary | ICD-10-CM | POA: Diagnosis not present

## 2018-08-24 MED ORDER — TECHNETIUM TC 99M SESTAMIBI - CARDIOLITE
20.0000 | Freq: Once | INTRAVENOUS | Status: AC | PRN
  Administered 2018-08-24: 10:00:00 20 via INTRAVENOUS

## 2018-08-31 DIAGNOSIS — I10 Essential (primary) hypertension: Secondary | ICD-10-CM | POA: Diagnosis not present

## 2018-08-31 DIAGNOSIS — F320 Major depressive disorder, single episode, mild: Secondary | ICD-10-CM | POA: Diagnosis not present

## 2018-09-04 ENCOUNTER — Ambulatory Visit: Payer: Self-pay | Admitting: Surgery

## 2018-09-04 DIAGNOSIS — I10 Essential (primary) hypertension: Secondary | ICD-10-CM | POA: Diagnosis not present

## 2018-09-04 DIAGNOSIS — E210 Primary hyperparathyroidism: Secondary | ICD-10-CM | POA: Diagnosis not present

## 2018-09-04 DIAGNOSIS — M810 Age-related osteoporosis without current pathological fracture: Secondary | ICD-10-CM | POA: Diagnosis not present

## 2018-10-12 ENCOUNTER — Encounter (HOSPITAL_COMMUNITY): Payer: Self-pay

## 2018-10-12 NOTE — Patient Instructions (Addendum)
DUE TO COVID-19 ONLY ONE VISITOR IS ALLOWED IN THE WAITING ROOM ONLY AT THIS TIME    COVID SWAB TESTING MUST BE COMPLETED ON: Today immediately after pre op appointment. [address] [place] [facility] enter pre surgical testing line (Must self quarantine after testing. Follow instructions on handout.)              Your procedure is scheduled on: Thursday, Aug. 6, 2020   Report to [facility] Main  Entrance    Report to admitting at 7:30 AM   Call this number if you have problems the morning of surgery [phone]   Do not eat food or drink liquids :After Midnight.   Brush your teeth the morning of surgery.    Take these medicines the morning of surgery with A SIP OF WATER: Amlodipine, Fluoxetine, Metoprolol   Use eye drops per normal routine morning of surgery                               You may not have any metal on your body including hair pins, jewelry, and body piercings             Do not wear make-up, lotions, powders, perfumes/cologne, or deodorant             Do not wear nail polish.  Do not shave  48 hours prior to surgery.                Do not bring valuables to the hospital. [place].   Contacts, dentures or bridgework may not be worn into surgery.   Bring small overnight bag day of surgery.                Please read over the following fact sheets you were given:    [facility] - Preparing for Surgery Before surgery, you can play an important role.  Because skin is not sterile, your skin needs to be as free of germs as possible.  You can reduce the number of germs on your skin by washing with CHG (chlorahexidine gluconate) soap before surgery.  CHG is an antiseptic cleaner which kills germs and bonds with the skin to continue killing germs even after washing. Please DO NOT use if you have an allergy to CHG or antibacterial soaps.  If your skin becomes reddened/irritated  stop using the CHG and inform your nurse when you arrive at Short Stay. Do not shave (including legs and underarms) for at least 48 hours prior to the first CHG shower.  You may shave your face/neck.   Please follow these instructions carefully:  1.  Shower with CHG Soap the night before surgery and the  morning of surgery.  2.  If you choose to wash your hair, wash your hair first as usual with your normal  shampoo.  3.  After you shampoo, rinse your hair and body thoroughly to remove the shampoo.                             4.  Use CHG as you would any other liquid soap.  You can apply chg directly to the skin and wash.  Gently with a scrungie or clean washcloth.  5.  Apply the  CHG Soap to your body ONLY FROM THE NECK DOWN.   Do   not use on face/ open                           Wound or open sores. Avoid contact with eyes, ears mouth and   genitals (private parts).                       Wash face,  Genitals (private parts) with your normal soap.             6.  Wash thoroughly, paying special attention to the area where your    surgery  will be performed.  7.  Thoroughly rinse your body with warm water from the neck down.  8.  DO NOT shower/wash with your normal soap after using and rinsing off the CHG Soap.                9.  Pat yourself dry with a clean towel.            10.  Wear clean pajamas.            11.  Place clean sheets on your bed the night of your first shower and do not  sleep with pets. Day of Surgery : Do not apply any lotions/deodorants the morning of surgery.  Please wear clean clothes to the hospital/surgery center.  FAILURE TO FOLLOW THESE INSTRUCTIONS MAY RESULT IN THE CANCELLATION OF YOUR SURGERY  PATIENT SIGNATURE_________________________________  NURSE SIGNATURE__________________________________  ________________________________________________________________________

## 2018-10-13 ENCOUNTER — Encounter (HOSPITAL_COMMUNITY): Payer: Self-pay | Admitting: Surgery

## 2018-10-13 DIAGNOSIS — E210 Primary hyperparathyroidism: Secondary | ICD-10-CM | POA: Diagnosis present

## 2018-10-13 NOTE — H&P (Signed)
General Surgery [facility]  Rhonda Bush DOB: 1950-10-14 Single / Language: [place] / Race: Black or African American Female   History of Present Illness   The patient is a 68 year old female who presents with primary hyperparathyroidism.  CHIEF COMPLAINT: primary hyperparathyroidism  Patient is referred by Dr. Jacelyn Pi for surgical evaluation and management of primary hyperparathyroidism. The patient has a long-standing history of hypercalcemia and suspected primary hyperparathyroidism dating back over 20 years. Calcium levels always remained mildly elevated. Recently she has had persistent elevation of her calcium with the most recent level was 11.3. Intact PTH level was elevated at 110. Vitamin D level is normal at 46. Patient underwent ultrasound examination of the neck on August 24, 2018. This demonstrated a 4.4 x 1.5 x 0.9 cm mass posterior to the left thyroid lobe. Nuclear medicine parathyroid scan performed on the same date. Significant increased uptake corresponding to the mass in the left thyroid bed. This was felt to represent a large left sided parathyroid adenoma. Patient does have osteoporosis. She notes chronic fatigue. She has had some problems with memory and confusion. She has also had some gastrointestinal complaints. She has had no prior neck surgery. There is no family history of endocrine neoplasm. She denies nephrolithiasis. Patient presents today to discuss surgical removal of a suspected very large parathyroid adenoma.   Past Surgical History Breast Biopsy  Right. Colon Polyp Removal - Colonoscopy  Hysterectomy (not due to cancer) - Complete   Diagnostic Studies History Colonoscopy  within last year Mammogram  within last year Pap Smear  >5 years ago  Allergies No Known Drug Allergies  [09/04/2018]: Allergies Reconciled   Medication History amLODIPine Besylate (5MG  Tablet, Oral) Active. FLUoxetine HCl  (60MG  Tablet, Oral) Active. Folic Acid (1MG  Tablet, Oral) Active. Meloxicam (15MG  Tablet, Oral) Active. Methotrexate (2.5MG  Tablet, Oral) Active. Metoprolol Succinate ER (100MG  Tablet ER 24HR, Oral) Active. SUMAtriptan Succinate (100MG  Tablet, Oral) Active. Telmisartan (80MG  Tablet, Oral) Active. Latanoprost (0.005% Solution, Ophthalmic) Active. Aspirin (81MG  Tablet, Oral) Active. Medications Reconciled  Social History Alcohol use  Occasional alcohol use. Caffeine use  Tea. No drug use  Tobacco use  Never smoker.  Family History Alcohol Abuse  Brother, Father. Arthritis  Brother, Mother. Colon Cancer  Brother. Colon Polyps  Sister. Depression  Sister. Diabetes Mellitus  Brother, Mother. Heart Disease  Mother. Hypertension  Brother, Mother. Kidney Disease  Brother, Family Members In [place], Mother.  Pregnancy / Birth History Age at menarche  69 years. Age of menopause  <47 Gravida  1 Maternal age  66-25 Para  14  Other Problems  Arthritis  Back Pain  Depression  Hemorrhoids  High blood pressure  Oophorectomy  Bilateral. Thyroid Disease   Review of Systems  General Present- Night Sweats. Not Present- Appetite Loss, Chills, Fatigue, Fever, Weight Gain and Weight Loss. Skin Not Present- Change in Wart/Mole, Dryness, Hives, Jaundice, New Lesions, Non-Healing Wounds, Rash and Ulcer. HEENT Present- Hoarseness. Not Present- Earache, Hearing Loss, Nose Bleed, Oral Ulcers, Ringing in the Ears, Seasonal Allergies, Sinus Pain, Sore Throat, Visual Disturbances, Wears glasses/contact lenses and Yellow Eyes. Respiratory Not Present- Bloody sputum, Chronic Cough, Difficulty Breathing, Snoring and Wheezing. Breast Not Present- Breast Mass, Breast Pain, Nipple Discharge and Skin Changes. Cardiovascular Not Present- Chest Pain, Difficulty Breathing Lying Down, Leg Cramps, Palpitations, Rapid Heart Rate, Shortness of Breath and Swelling of  Extremities. Gastrointestinal Present- Excessive gas and Hemorrhoids. Not Present- Abdominal Pain, Bloating, Bloody Stool, Change in Bowel Habits, Chronic  diarrhea, Constipation, Difficulty Swallowing, Gets full quickly at meals, Indigestion, Nausea, Rectal Pain and Vomiting. Female Genitourinary Not Present- Frequency, Nocturia, Painful Urination, Pelvic Pain and Urgency. Musculoskeletal Present- Back Pain. Not Present- Joint Pain, Joint Stiffness, Muscle Pain, Muscle Weakness and Swelling of Extremities. Neurological Present- Numbness and Tingling. Not Present- Decreased Memory, Fainting, Headaches, Seizures, Tremor, Trouble walking and Weakness. Psychiatric Present- Depression. Not Present- Anxiety, Bipolar, Change in Sleep Pattern, Fearful and Frequent crying. Endocrine Present- Hot flashes. Not Present- Cold Intolerance, Excessive Hunger, Hair Changes, Heat Intolerance and New Diabetes. Hematology Not Present- Blood Thinners, Easy Bruising, Excessive bleeding, Gland problems, HIV and Persistent Infections.  Vitals Weight: 143.8 lb Height: 58.5in Body Surface Area: 1.59 m Body Mass Index: 29.54 kg/m  Temp.: 98.68F  Pulse: 80 (Regular)  BP: 128/82(Sitting, Left Arm, Standard)   Physical Exam  See vital signs recorded above  GENERAL APPEARANCE Development: normal Nutritional status: normal Gross deformities: none  SKIN Rash, lesions, ulcers: none Induration, erythema: none Nodules: none palpable  EYES Conjunctiva and lids: normal Pupils: equal and reactive Iris: normal bilaterally  EARS, NOSE, MOUTH, THROAT External ears: no lesion or deformity External nose: no lesion or deformity Hearing: grossly normal Lips: no lesion or deformity Dentition: normal for age Oral mucosa: moist  NECK Symmetric: yes Trachea: midline Thyroid: no palpable nodules in the thyroid bed  CHEST Respiratory effort: normal Retraction or accessory muscle use: no Breath sounds:  normal bilaterally Rales, rhonchi, wheeze: none  CARDIOVASCULAR Auscultation: regular rhythm, normal rate Murmurs: none Pulses: carotid and radial pulse 2+ palpable Lower extremity edema: none Lower extremity varicosities: none  MUSCULOSKELETAL Station and gait: normal Digits and nails: no clubbing or cyanosis Muscle strength: grossly normal all extremities Range of motion: grossly normal all extremities Deformity: none  LYMPHATIC Cervical: none palpable Supraclavicular: none palpable  PSYCHIATRIC Oriented to person, place, and time: yes Mood and affect: normal for situation Judgment and insight: appropriate for situation    Assessment & Plan   PRIMARY HYPERPARATHYROIDISM (E21.0)  Pt Education - Pamphlet Given - The Parathyroid Surgery Book: discussed with patient and provided information.  OSTEOPOROSIS (M81.0)  The patient is referred by her endocrinologist for surgical evaluation of primary hyperparathyroidism. Patient has already had a complete workup including laboratory studies, ultrasound examination, and nuclear medicine parathyroid scan. Patient is provided with written literature on parathyroid surgery to review at home.  It appears the patient has a very large 4.4 cm parathyroid adenoma in the left neck. We reviewed her studies in detail. I have recommended a minimally invasive approach to the left thyroid bed for parathyroidectomy. Given the size and chronicity of this lesion, I have recommended an overnight hospital stay for monitoring her postoperative calcium level. I think she is at risk for significant hypocalcemia. We discussed risk and benefits of the procedure including the risk of recurrent laryngeal nerve injury which is less than 1%. We discussed the location of the surgical incision. We discussed her postoperative recovery and return to work. She understands and wishes to proceed with surgery in the near future.  The risks and benefits of the  procedure have been discussed at length with the patient. The patient understands the proposed procedure, potential alternative treatments, and the course of recovery to be expected. All of the patient's questions have been answered at this time. The patient wishes to proceed with surgery.  Armandina Gemma, [facility] Office: [phone]

## 2018-10-15 ENCOUNTER — Encounter (HOSPITAL_COMMUNITY): Payer: Self-pay

## 2018-10-15 ENCOUNTER — Other Ambulatory Visit (HOSPITAL_COMMUNITY)
Admission: RE | Admit: 2018-10-15 | Discharge: 2018-10-15 | Disposition: A | Discharge status: Home / Self Care | Payer: BC Managed Care – PPO | Source: Ambulatory Visit | Attending: Surgery | Admitting: Surgery

## 2018-10-15 ENCOUNTER — Encounter (HOSPITAL_COMMUNITY)
Admission: RE | Admit: 2018-10-15 | Discharge: 2018-10-15 | Disposition: A | Discharge status: Home / Self Care | Payer: BC Managed Care – PPO | Source: Ambulatory Visit | Attending: Surgery | Admitting: Surgery

## 2018-10-15 ENCOUNTER — Other Ambulatory Visit: Payer: Self-pay

## 2018-10-15 DIAGNOSIS — R9431 Abnormal electrocardiogram [ECG] [EKG]: Secondary | ICD-10-CM | POA: Diagnosis not present

## 2018-10-15 DIAGNOSIS — R001 Bradycardia, unspecified: Secondary | ICD-10-CM | POA: Diagnosis not present

## 2018-10-15 DIAGNOSIS — Z20828 Contact with and (suspected) exposure to other viral communicable diseases: Secondary | ICD-10-CM | POA: Diagnosis not present

## 2018-10-15 DIAGNOSIS — E210 Primary hyperparathyroidism: Secondary | ICD-10-CM | POA: Insufficient documentation

## 2018-10-15 DIAGNOSIS — Z01818 Encounter for other preprocedural examination: Secondary | ICD-10-CM | POA: Insufficient documentation

## 2018-10-15 DIAGNOSIS — I10 Essential (primary) hypertension: Secondary | ICD-10-CM | POA: Diagnosis not present

## 2018-10-15 HISTORY — DX: Personal history of other diseases of the digestive system: Z87.19

## 2018-10-15 LAB — CBC
HCT: 43.8 % (ref 36.0–46.0)
Hemoglobin: 13.1 g/dL (ref 12.0–15.0)
MCH: 28.5 pg (ref 26.0–34.0)
MCHC: 29.9 g/dL — ABNORMAL LOW (ref 30.0–36.0)
MCV: 95.2 fL (ref 80.0–100.0)
Platelets: 304 10*3/uL (ref 150–400)
RBC: 4.6 MIL/uL (ref 3.87–5.11)
RDW: 14 % (ref 11.5–15.5)
WBC: 3.2 10*3/uL — ABNORMAL LOW (ref 4.0–10.5)
nRBC: 0 % (ref 0.0–0.2)

## 2018-10-15 LAB — BASIC METABOLIC PANEL
Anion gap: 5 (ref 5–15)
BUN: 8 mg/dL (ref 8–23)
CO2: 25 mmol/L (ref 22–32)
Calcium: 11.4 mg/dL — ABNORMAL HIGH (ref 8.9–10.3)
Chloride: 111 mmol/L (ref 98–111)
Creatinine, Ser: 0.57 mg/dL (ref 0.44–1.00)
GFR calc Af Amer: >60 mL/min (ref >60)
GFR calc non Af Amer: >60 mL/min (ref >60)
Glucose, Bld: 97 mg/dL (ref 70–99)
Potassium: 4.6 mmol/L (ref 3.5–5.1)
Sodium: 141 mmol/L (ref 135–145)

## 2018-10-15 LAB — SARS CORONAVIRUS 2 (TAT 6-24 HRS): SARS Coronavirus 2: NEGATIVE

## 2018-10-18 ENCOUNTER — Ambulatory Visit (HOSPITAL_COMMUNITY): Payer: BC Managed Care – PPO | Admitting: Anesthesiology

## 2018-10-18 ENCOUNTER — Ambulatory Visit (HOSPITAL_COMMUNITY)
Admission: RE | Admit: 2018-10-18 | Discharge: 2018-10-18 | Disposition: A | Discharge status: Home / Self Care | Payer: BC Managed Care – PPO | Source: Other Acute Inpatient Hospital | Attending: Surgery | Admitting: Surgery

## 2018-10-18 ENCOUNTER — Encounter (HOSPITAL_COMMUNITY): Payer: Self-pay

## 2018-10-18 ENCOUNTER — Ambulatory Visit (HOSPITAL_COMMUNITY): Payer: BC Managed Care – PPO | Admitting: Physician Assistant

## 2018-10-18 ENCOUNTER — Encounter (HOSPITAL_COMMUNITY)
Admission: RE | Disposition: A | Discharge status: Home / Self Care | Payer: Self-pay | Source: Other Acute Inpatient Hospital | Attending: Surgery

## 2018-10-18 DIAGNOSIS — Z811 Family history of alcohol abuse and dependence: Secondary | ICD-10-CM | POA: Diagnosis not present

## 2018-10-18 DIAGNOSIS — F329 Major depressive disorder, single episode, unspecified: Secondary | ICD-10-CM | POA: Insufficient documentation

## 2018-10-18 DIAGNOSIS — D351 Benign neoplasm of parathyroid gland: Secondary | ICD-10-CM | POA: Diagnosis not present

## 2018-10-18 DIAGNOSIS — M810 Age-related osteoporosis without current pathological fracture: Secondary | ICD-10-CM | POA: Diagnosis not present

## 2018-10-18 DIAGNOSIS — R5383 Other fatigue: Secondary | ICD-10-CM | POA: Diagnosis not present

## 2018-10-18 DIAGNOSIS — Z86010 Personal history of colonic polyps: Secondary | ICD-10-CM | POA: Insufficient documentation

## 2018-10-18 DIAGNOSIS — R001 Bradycardia, unspecified: Secondary | ICD-10-CM | POA: Diagnosis not present

## 2018-10-18 DIAGNOSIS — Z8249 Family history of ischemic heart disease and other diseases of the circulatory system: Secondary | ICD-10-CM | POA: Insufficient documentation

## 2018-10-18 DIAGNOSIS — Z833 Family history of diabetes mellitus: Secondary | ICD-10-CM | POA: Insufficient documentation

## 2018-10-18 DIAGNOSIS — Z8261 Family history of arthritis: Secondary | ICD-10-CM | POA: Insufficient documentation

## 2018-10-18 DIAGNOSIS — I10 Essential (primary) hypertension: Secondary | ICD-10-CM | POA: Diagnosis not present

## 2018-10-18 DIAGNOSIS — J45909 Unspecified asthma, uncomplicated: Secondary | ICD-10-CM | POA: Diagnosis not present

## 2018-10-18 DIAGNOSIS — M1990 Unspecified osteoarthritis, unspecified site: Secondary | ICD-10-CM | POA: Insufficient documentation

## 2018-10-18 DIAGNOSIS — Z818 Family history of other mental and behavioral disorders: Secondary | ICD-10-CM | POA: Insufficient documentation

## 2018-10-18 DIAGNOSIS — Z8371 Family history of colonic polyps: Secondary | ICD-10-CM | POA: Insufficient documentation

## 2018-10-18 DIAGNOSIS — Z79899 Other long term (current) drug therapy: Secondary | ICD-10-CM | POA: Diagnosis not present

## 2018-10-18 DIAGNOSIS — R030 Elevated blood-pressure reading, without diagnosis of hypertension: Secondary | ICD-10-CM | POA: Diagnosis not present

## 2018-10-18 DIAGNOSIS — Z841 Family history of disorders of kidney and ureter: Secondary | ICD-10-CM | POA: Diagnosis not present

## 2018-10-18 DIAGNOSIS — Z7982 Long term (current) use of aspirin: Secondary | ICD-10-CM | POA: Diagnosis not present

## 2018-10-18 DIAGNOSIS — R51 Headache: Secondary | ICD-10-CM | POA: Insufficient documentation

## 2018-10-18 DIAGNOSIS — Z791 Long term (current) use of non-steroidal anti-inflammatories (NSAID): Secondary | ICD-10-CM | POA: Insufficient documentation

## 2018-10-18 DIAGNOSIS — K449 Diaphragmatic hernia without obstruction or gangrene: Secondary | ICD-10-CM | POA: Insufficient documentation

## 2018-10-18 DIAGNOSIS — Z800 Family history of malignant neoplasm of digestive organs: Secondary | ICD-10-CM | POA: Diagnosis not present

## 2018-10-18 DIAGNOSIS — E210 Primary hyperparathyroidism: Secondary | ICD-10-CM | POA: Insufficient documentation

## 2018-10-18 DIAGNOSIS — Z90710 Acquired absence of both cervix and uterus: Secondary | ICD-10-CM | POA: Diagnosis not present

## 2018-10-18 DIAGNOSIS — Z1159 Encounter for screening for other viral diseases: Secondary | ICD-10-CM | POA: Diagnosis not present

## 2018-10-18 HISTORY — PX: PARATHYROIDECTOMY: SHX19

## 2018-10-18 SURGERY — PARATHYROIDECTOMY
Anesthesia: General | Site: Neck

## 2018-10-18 MED ORDER — ROCURONIUM BROMIDE 10 MG/ML (PF) SYRINGE
PREFILLED_SYRINGE | INTRAVENOUS | Status: AC
  Filled 2018-10-18: qty 10

## 2018-10-18 MED ORDER — ONDANSETRON HCL 4 MG/2ML IJ SOLN
INTRAMUSCULAR | Status: DC | PRN
  Administered 2018-10-18: 4 mg via INTRAVENOUS

## 2018-10-18 MED ORDER — CHLORHEXIDINE GLUCONATE CLOTH 2 % EX PADS: 6.0000 | MEDICATED_PAD | Freq: Once | CUTANEOUS | Status: DC

## 2018-10-18 MED ORDER — LACTATED RINGERS IV SOLN
INTRAVENOUS | Status: DC
  Administered 2018-10-18: 08:00:00 via INTRAVENOUS

## 2018-10-18 MED ORDER — ACETAMINOPHEN 10 MG/ML IV SOLN: 1000.0000 mg | Freq: Once | INTRAVENOUS | Status: DC | PRN

## 2018-10-18 MED ORDER — PROMETHAZINE HCL 25 MG/ML IJ SOLN: 6.2500 mg | INTRAMUSCULAR | Status: DC | PRN

## 2018-10-18 MED ORDER — DEXAMETHASONE SODIUM PHOSPHATE 10 MG/ML IJ SOLN
INTRAMUSCULAR | Status: AC
  Filled 2018-10-18: qty 1

## 2018-10-18 MED ORDER — EPHEDRINE SULFATE-NACL 50-0.9 MG/10ML-% IV SOSY
PREFILLED_SYRINGE | INTRAVENOUS | Status: DC | PRN
  Administered 2018-10-18: 5 mg via INTRAVENOUS

## 2018-10-18 MED ORDER — FENTANYL CITRATE (PF) 100 MCG/2ML IJ SOLN
INTRAMUSCULAR | Status: DC | PRN
  Administered 2018-10-18: 100 ug via INTRAVENOUS

## 2018-10-18 MED ORDER — ROCURONIUM BROMIDE 10 MG/ML (PF) SYRINGE
PREFILLED_SYRINGE | INTRAVENOUS | Status: DC | PRN
  Administered 2018-10-18: 50 mg via INTRAVENOUS

## 2018-10-18 MED ORDER — TRAMADOL HCL 50 MG PO TABS: 50.0000 mg | ORAL_TABLET | Freq: Four times a day (QID) | ORAL | 0 refills | Status: DC | PRN

## 2018-10-18 MED ORDER — CEFAZOLIN SODIUM-DEXTROSE 2-4 GM/100ML-% IV SOLN
2.0000 g | INTRAVENOUS | Status: AC
  Administered 2018-10-18: 2 g via INTRAVENOUS
  Filled 2018-10-18: qty 100

## 2018-10-18 MED ORDER — FENTANYL CITRATE (PF) 100 MCG/2ML IJ SOLN
INTRAMUSCULAR | Status: AC
  Filled 2018-10-18: qty 2

## 2018-10-18 MED ORDER — LIDOCAINE 2% (20 MG/ML) 5 ML SYRINGE
INTRAMUSCULAR | Status: DC | PRN
  Administered 2018-10-18: 100 mg via INTRAVENOUS

## 2018-10-18 MED ORDER — SODIUM CHLORIDE 0.9 % IR SOLN
Status: DC | PRN
  Administered 2018-10-18: 1000 mL

## 2018-10-18 MED ORDER — FENTANYL CITRATE (PF) 100 MCG/2ML IJ SOLN: 25.0000 ug | INTRAMUSCULAR | Status: DC | PRN

## 2018-10-18 MED ORDER — PROPOFOL 10 MG/ML IV BOLUS
INTRAVENOUS | Status: AC
  Filled 2018-10-18: qty 20

## 2018-10-18 MED ORDER — ACETAMINOPHEN 325 MG PO TABS: 325.0000 mg | ORAL_TABLET | Freq: Once | ORAL | Status: DC | PRN

## 2018-10-18 MED ORDER — EPHEDRINE 5 MG/ML INJ
INTRAVENOUS | Status: AC
  Filled 2018-10-18: qty 10

## 2018-10-18 MED ORDER — MIDAZOLAM HCL 2 MG/2ML IJ SOLN
INTRAMUSCULAR | Status: AC
  Filled 2018-10-18: qty 2

## 2018-10-18 MED ORDER — LACTATED RINGERS IV SOLN: INTRAVENOUS | Status: DC

## 2018-10-18 MED ORDER — BUPIVACAINE HCL 0.25 % IJ SOLN
INTRAMUSCULAR | Status: DC | PRN
  Administered 2018-10-18: 10 mL

## 2018-10-18 MED ORDER — PROPOFOL 10 MG/ML IV BOLUS
INTRAVENOUS | Status: DC | PRN
  Administered 2018-10-18: 110 mg via INTRAVENOUS

## 2018-10-18 MED ORDER — BUPIVACAINE HCL (PF) 0.25 % IJ SOLN
INTRAMUSCULAR | Status: AC
  Filled 2018-10-18: qty 30

## 2018-10-18 MED ORDER — SUGAMMADEX SODIUM 200 MG/2ML IV SOLN
INTRAVENOUS | Status: DC | PRN
  Administered 2018-10-18: 150 mg via INTRAVENOUS

## 2018-10-18 MED ORDER — ACETAMINOPHEN 160 MG/5ML PO SOLN: 325.0000 mg | Freq: Once | ORAL | Status: DC | PRN

## 2018-10-18 MED ORDER — MEPERIDINE HCL 50 MG/ML IJ SOLN: 6.2500 mg | INTRAMUSCULAR | Status: DC | PRN

## 2018-10-18 MED ORDER — LIDOCAINE 2% (20 MG/ML) 5 ML SYRINGE
INTRAMUSCULAR | Status: AC
  Filled 2018-10-18: qty 5

## 2018-10-18 MED ORDER — MIDAZOLAM HCL 2 MG/2ML IJ SOLN
INTRAMUSCULAR | Status: DC | PRN
  Administered 2018-10-18: 2 mg via INTRAVENOUS

## 2018-10-18 MED ORDER — DEXAMETHASONE SODIUM PHOSPHATE 10 MG/ML IJ SOLN
INTRAMUSCULAR | Status: DC | PRN
  Administered 2018-10-18: 10 mg via INTRAVENOUS

## 2018-10-18 MED ORDER — ONDANSETRON HCL 4 MG/2ML IJ SOLN
INTRAMUSCULAR | Status: AC
  Filled 2018-10-18: qty 2

## 2018-10-18 SURGICAL SUPPLY — 29 items
ATTRACTOMAT 16X20 MAGNETIC DRP (DRAPES) ×2 IMPLANT
BLADE SURG 15 STRL LF DISP TIS (BLADE) ×1 IMPLANT
BLADE SURG 15 STRL SS (BLADE) ×1
CHLORAPREP W/TINT 26 (MISCELLANEOUS) ×2 IMPLANT
CLIP VESOCCLUDE MED 6/CT (CLIP) ×4 IMPLANT
CLIP VESOCCLUDE SM WIDE 6/CT (CLIP) ×4 IMPLANT
COVER SURGICAL LIGHT HANDLE (MISCELLANEOUS) ×2 IMPLANT
COVER WAND RF STERILE (DRAPES) ×2 IMPLANT
DERMABOND ADVANCED (GAUZE/BANDAGES/DRESSINGS) ×1
DERMABOND ADVANCED .7 DNX12 (GAUZE/BANDAGES/DRESSINGS) ×1 IMPLANT
DRAPE LAPAROTOMY T 98X78 PEDS (DRAPES) ×2 IMPLANT
ELECT PENCIL ROCKER SW 15FT (MISCELLANEOUS) ×2 IMPLANT
ELECT REM PT RETURN 15FT ADLT (MISCELLANEOUS) ×2 IMPLANT
GAUZE 4X4 16PLY RFD (DISPOSABLE) ×2 IMPLANT
GLOVE SURG ORTHO 8.0 STRL STRW (GLOVE) ×2 IMPLANT
GOWN STRL REUS W/TWL XL LVL3 (GOWN DISPOSABLE) ×6 IMPLANT
HEMOSTAT SURGICEL 2X4 FIBR (HEMOSTASIS) IMPLANT
ILLUMINATOR WAVEGUIDE N/F (MISCELLANEOUS) IMPLANT
KIT BASIN OR (CUSTOM PROCEDURE TRAY) ×2 IMPLANT
KIT TURNOVER KIT A (KITS) IMPLANT
NEEDLE HYPO 25X1 1.5 SAFETY (NEEDLE) ×2 IMPLANT
PACK BASIC VI WITH GOWN DISP (CUSTOM PROCEDURE TRAY) ×2 IMPLANT
SUT MNCRL AB 4-0 PS2 18 (SUTURE) ×2 IMPLANT
SUT VIC AB 3-0 SH 18 (SUTURE) ×2 IMPLANT
SYR BULB IRRIGATION 50ML (SYRINGE) ×2 IMPLANT
SYR CONTROL 10ML LL (SYRINGE) ×2 IMPLANT
TOWEL OR 17X26 10 PK STRL BLUE (TOWEL DISPOSABLE) ×2 IMPLANT
TOWEL OR NON WOVEN STRL DISP B (DISPOSABLE) ×2 IMPLANT
TUBING CONNECTING 10 (TUBING) ×2 IMPLANT

## 2018-10-18 NOTE — Anesthesia Procedure Notes (Signed)
Procedure Name: Intubation Date/Time: 10/18/2018 9:16 AM Performed by: Sharlette Dense, CRNA Patient Re-evaluated:Patient Re-evaluated prior to induction Oxygen Delivery Method: Circle system utilized Preoxygenation: Pre-oxygenation with 100% oxygen Induction Type: IV induction Ventilation: Mask ventilation without difficulty and Oral airway inserted - appropriate to patient size Laryngoscope Size: Sabra Heck and 2 Grade View: Grade I Tube type: Reinforced Tube size: 7.0 mm Number of attempts: 1 Airway Equipment and Method: Stylet Placement Confirmation: ETT inserted through vocal cords under direct vision,  positive ETCO2 and breath sounds checked- equal and bilateral Secured at: 21 cm Tube secured with: Tape Dental Injury: Teeth and Oropharynx as per pre-operative assessment

## 2018-10-18 NOTE — Transfer of Care (Signed)
Immediate Anesthesia Transfer of Care Note  Patient: Rhonda Bush  Procedure(s) Performed: PARATHYROIDECTOMY (N/A Neck)  Patient Location: PACU  Anesthesia Type:General  Level of Consciousness: drowsy  Airway & Oxygen Therapy: Patient Spontanous Breathing and Patient connected to face mask oxygen  Post-op Assessment: Report given to RN and Post -op Vital signs reviewed and stable  Post vital signs: Reviewed and stable  Last Vitals:  Vitals Value Taken Time  BP    Temp    Pulse    Resp    SpO2      Last Pain:  Vitals:   10/18/18 0804  TempSrc:   PainSc: 0-No pain         Complications: No apparent anesthesia complications

## 2018-10-18 NOTE — Op Note (Signed)
OPERATIVE REPORT - PARATHYROIDECTOMY  Preoperative diagnosis: Primary hyperparathyroidism  Postop diagnosis: Same  Procedure: Left minimally invasive parathyroidectomy  Surgeon:  Armandina Gemma, MD  Anesthesia: General endotracheal  Estimated blood loss: Minimal  Preparation: ChloraPrep  Indications: Patient is referred by Dr. Jacelyn Pi for surgical evaluation and management of primary hyperparathyroidism. The patient has a long-standing history of hypercalcemia and suspected primary hyperparathyroidism dating back over 20 years. Calcium levels always remained mildly elevated. Recently she has had persistent elevation of her calcium with the most recent level was 11.3. Intact PTH level was elevated at 110. Vitamin D level is normal at 46. Patient underwent ultrasound examination of the neck on August 24, 2018. This demonstrated a 4.4 x 1.5 x 0.9 cm mass posterior to the left thyroid lobe. Nuclear medicine parathyroid scan performed on the same date. Significant increased uptake corresponding to the mass in the left thyroid bed. This was felt to represent a large left sided parathyroid adenoma. Patient does have osteoporosis. She notes chronic fatigue. She has had some problems with memory and confusion. She has also had some gastrointestinal complaints. She has had no prior neck surgery. There is no family history of endocrine neoplasm. She denies nephrolithiasis. Patient presents today for surgical removal of a suspected very large parathyroid adenoma.  Procedure: The patient was prepared in the pre-operative holding area. The patient was brought to the operating room and placed in a supine position on the operating room table. Following administration of general anesthesia, the patient was positioned and then prepped and draped in the usual strict aseptic fashion. After ascertaining that an adequate level of anesthesia been achieved, a neck incision was made with a #15 blade.  Dissection was carried through subcutaneous tissues and platysma. Hemostasis was obtained with the electrocautery. Skin flaps were developed circumferentially and a Weitlander retractor was placed for exposure.  Strap muscles were incised in the midline. Strap muscles were reflected lateralley exposing the thyroid lobe. With gentle blunt dissection the thyroid lobe was mobilized.  Dissection was carried through adipose tissue and an markedly enlarged parathyroid gland was identified. It was gently mobilized. Vascular structures were divided between small ligaclips. Care was taken to avoid the recurrent laryngeal nerve and the esophagus. The parathyroid gland was completely excised. It was submitted to pathology where frozen section confirmed parathyroid tissue consistent with adenoma.  Neck was irrigated with warm saline and good hemostasis was noted. Fibrillar was placed in the operative field. Strap muscles were approximated in the midline with interrupted 3-0 Vicryl sutures. Platysma was closed with interrupted 3-0 Vicryl sutures. Marcaine was infiltrated circumferentially. Skin was closed with a running 4-0 Monocryl subcuticular suture. Wound was washed and dried and Dermabond was applied. Patient was awakened from anesthesia and brought to the recovery room. The patient tolerated the procedure well.   Armandina Gemma, MD [facility] Office: [phone]

## 2018-10-18 NOTE — Anesthesia Preprocedure Evaluation (Addendum)
Anesthesia Evaluation  Patient identified by MRN, date of birth, ID band Patient awake    Reviewed: Allergy & Precautions, NPO status , Patient's Chart, lab work & pertinent test results  Airway Mallampati: I  TM Distance: >3 FB Neck ROM: Full    Dental  (+) Teeth Intact, Dental Advisory Given   Pulmonary asthma ,    breath sounds clear to auscultation       Cardiovascular hypertension, Pt. on medications and Pt. on home beta blockers  Rhythm:Regular Rate:Bradycardia     Neuro/Psych  Headaches, Depression    GI/Hepatic Neg liver ROS, hiatal hernia,   Endo/Other  negative endocrine ROS  Renal/GU negative Renal ROS     Musculoskeletal  (+) Arthritis ,   Abdominal Normal abdominal exam  (+)   Peds  Hematology negative hematology ROS (+)   Anesthesia Other Findings   Reproductive/Obstetrics                             Anesthesia Physical Anesthesia Plan  ASA: II  Anesthesia Plan: General   Post-op Pain Management:    Induction: Intravenous  PONV Risk Score and Plan: 4 or greater and Ondansetron, Midazolam, Dexamethasone and Treatment may vary due to age or medical condition  Airway Management Planned: Oral ETT  Additional Equipment: None  Intra-op Plan:   Post-operative Plan: Extubation in OR  Informed Consent: I have reviewed the patients History and Physical, chart, labs and discussed the procedure including the risks, benefits and alternatives for the proposed anesthesia with the patient or authorized representative who has indicated his/her understanding and acceptance.     Dental advisory given  Plan Discussed with: CRNA  Anesthesia Plan Comments: (COVID-19 Labs  No results for input(s): DDIMER, FERRITIN, LDH, CRP in the last 72 hours.  Lab Results      Component                Value               Date                      [id number]              NEGATIVE             10/15/2018            )        Anesthesia Quick Evaluation

## 2018-10-18 NOTE — Interval H&P Note (Signed)
History and Physical Interval Note:  10/18/2018 8:44 AM  Rhonda Bush  has presented today for surgery, with the diagnosis of PRIMARY HYPERPARATHYROIDISM.  The various methods of treatment have been discussed with the patient and family. After consideration of risks, benefits and other options for treatment, the patient has consented to    Procedure(s): PARATHYROIDECTOMY (N/A) as a surgical intervention.   The patient's history has been reviewed, patient examined, no change in status, stable for surgery.  I have reviewed the patient's chart and labs.  Questions were answered to the patient's satisfaction.    Armandina Gemma, [facility] Office: [place]

## 2018-10-18 NOTE — Anesthesia Postprocedure Evaluation (Signed)
Anesthesia Post Note  Patient: Loan Oguin  Procedure(s) Performed: PARATHYROIDECTOMY (N/A Neck)     Patient location during evaluation: PACU Anesthesia Type: General Level of consciousness: awake and alert Pain management: pain level controlled Vital Signs Assessment: post-procedure vital signs reviewed and stable Respiratory status: spontaneous breathing, nonlabored ventilation, respiratory function stable and patient connected to nasal cannula oxygen Cardiovascular status: blood pressure returned to baseline and stable Postop Assessment: no apparent nausea or vomiting Anesthetic complications: no    Last Vitals:  Vitals:   10/18/18 1115 10/18/18 1126  BP: 135/81 (!) 155/101  Pulse: 68 72  Resp: (!) 22 16  Temp: 36.7 C 36.6 C  SpO2: 95% 92%    Last Pain:  Vitals:   10/18/18 1126  TempSrc:   PainSc: 0-No pain                 Effie Berkshire

## 2018-10-19 ENCOUNTER — Encounter (HOSPITAL_COMMUNITY): Payer: Self-pay | Admitting: Surgery

## 2018-10-22 ENCOUNTER — Inpatient Hospital Stay (HOSPITAL_COMMUNITY)
Admission: EM | Admit: 2018-10-22 | Discharge: 2018-10-25 | DRG: 581 | Disposition: A | Discharge status: Home / Self Care | Payer: BC Managed Care – PPO | Attending: Surgery | Admitting: Surgery

## 2018-10-22 ENCOUNTER — Emergency Department (HOSPITAL_COMMUNITY): Payer: BC Managed Care – PPO

## 2018-10-22 ENCOUNTER — Other Ambulatory Visit: Payer: Self-pay

## 2018-10-22 ENCOUNTER — Encounter (HOSPITAL_COMMUNITY): Payer: Self-pay | Admitting: Family Medicine

## 2018-10-22 DIAGNOSIS — I10 Essential (primary) hypertension: Secondary | ICD-10-CM | POA: Diagnosis not present

## 2018-10-22 DIAGNOSIS — S1083XA Contusion of other specified part of neck, initial encounter: Secondary | ICD-10-CM | POA: Diagnosis not present

## 2018-10-22 DIAGNOSIS — Z79899 Other long term (current) drug therapy: Secondary | ICD-10-CM | POA: Diagnosis not present

## 2018-10-22 DIAGNOSIS — X58XXXA Exposure to other specified factors, initial encounter: Secondary | ICD-10-CM | POA: Diagnosis not present

## 2018-10-22 DIAGNOSIS — L7622 Postprocedural hemorrhage and hematoma of skin and subcutaneous tissue following other procedure: Secondary | ICD-10-CM | POA: Diagnosis not present

## 2018-10-22 DIAGNOSIS — G43909 Migraine, unspecified, not intractable, without status migrainosus: Secondary | ICD-10-CM | POA: Diagnosis present

## 2018-10-22 DIAGNOSIS — J45909 Unspecified asthma, uncomplicated: Secondary | ICD-10-CM | POA: Diagnosis not present

## 2018-10-22 DIAGNOSIS — Z90710 Acquired absence of both cervix and uterus: Secondary | ICD-10-CM

## 2018-10-22 DIAGNOSIS — Z7982 Long term (current) use of aspirin: Secondary | ICD-10-CM | POA: Diagnosis not present

## 2018-10-22 DIAGNOSIS — Z20828 Contact with and (suspected) exposure to other viral communicable diseases: Secondary | ICD-10-CM | POA: Diagnosis present

## 2018-10-22 DIAGNOSIS — R221 Localized swelling, mass and lump, neck: Secondary | ICD-10-CM | POA: Diagnosis not present

## 2018-10-22 DIAGNOSIS — Z03818 Encounter for observation for suspected exposure to other biological agents ruled out: Secondary | ICD-10-CM | POA: Diagnosis not present

## 2018-10-22 DIAGNOSIS — F329 Major depressive disorder, single episode, unspecified: Secondary | ICD-10-CM | POA: Diagnosis not present

## 2018-10-22 DIAGNOSIS — E89820 Postprocedural hematoma of an endocrine system organ or structure following an endocrine system procedure: Secondary | ICD-10-CM | POA: Diagnosis not present

## 2018-10-22 DIAGNOSIS — S1093XA Contusion of unspecified part of neck, initial encounter: Secondary | ICD-10-CM | POA: Diagnosis not present

## 2018-10-22 LAB — CBC
HCT: 40 % (ref 36.0–46.0)
Hemoglobin: 12.6 g/dL (ref 12.0–15.0)
MCH: 29.5 pg (ref 26.0–34.0)
MCHC: 31.5 g/dL (ref 30.0–36.0)
MCV: 93.7 fL (ref 80.0–100.0)
Platelets: 295 10*3/uL (ref 150–400)
RBC: 4.27 MIL/uL (ref 3.87–5.11)
RDW: 13.9 % (ref 11.5–15.5)
WBC: 4.1 10*3/uL (ref 4.0–10.5)
nRBC: 0 % (ref 0.0–0.2)

## 2018-10-22 LAB — SARS CORONAVIRUS 2 BY RT PCR (HOSPITAL ORDER, PERFORMED IN ~~LOC~~ HOSPITAL LAB): SARS Coronavirus 2: NEGATIVE

## 2018-10-22 LAB — BASIC METABOLIC PANEL
Anion gap: 9 (ref 5–15)
BUN: 16 mg/dL (ref 8–23)
CO2: 24 mmol/L (ref 22–32)
Calcium: 8.9 mg/dL (ref 8.9–10.3)
Chloride: 108 mmol/L (ref 98–111)
Creatinine, Ser: 0.68 mg/dL (ref 0.44–1.00)
GFR calc Af Amer: >60 mL/min (ref >60)
GFR calc non Af Amer: >60 mL/min (ref >60)
Glucose, Bld: 112 mg/dL — ABNORMAL HIGH (ref 70–99)
Potassium: 4.2 mmol/L (ref 3.5–5.1)
Sodium: 141 mmol/L (ref 135–145)

## 2018-10-22 MED ORDER — SODIUM CHLORIDE (PF) 0.9 % IJ SOLN
INTRAMUSCULAR | Status: AC
  Filled 2018-10-22: qty 50

## 2018-10-22 MED ORDER — IOHEXOL 300 MG/ML  SOLN
75.0000 mL | Freq: Once | INTRAMUSCULAR | Status: AC | PRN
  Administered 2018-10-22: 75 mL via INTRAVENOUS

## 2018-10-22 NOTE — ED Triage Notes (Signed)
Patient states she had parathyroid surgery on Thursday (08/06). She states she developed sudden throat swelling today. She has a patent airway. Also, stated that EMS evaluated her earlier at her home. Patient is able to speak in full sentences.

## 2018-10-22 NOTE — ED Notes (Signed)
Patient transported to CT 

## 2018-10-22 NOTE — ED Notes (Signed)
Surgeon at bedside.  

## 2018-10-22 NOTE — ED Notes (Signed)
Pt ambulated to the bathroom without assistance. Gait steady  

## 2018-10-22 NOTE — H&P (Signed)
Rhonda Bush is an 68 y.o. female.    General Surgery [facility]  Chief Complaint: sudden onset neck swelling  HPI: Patient is a 68 year old female well-known to my surgical practice from recent minimally invasive parathyroidectomy for treatment of primary hyperparathyroidism.  This was performed 4 days prior to admission.  This was an outpatient surgical procedure.  Patient did well postoperatively until this evening at approximately 7 PM when she experienced sudden onset of neck swelling.  Swelling progressed rapidly.  She had discomfort.  She denies any shortness of breath.  She denies any dysphagia.  Her sister brought her to the emergency room for assessment.  Patient was seen by the emergency room physician.  Laboratory studies were obtained and are pending.  A CT scan of the neck was ordered and is pending at the time of this dictation.  Past Medical History:  Diagnosis Date  . Arthritis    rheumatoid arthritis -mild(feet,ankles,hand)-not bothersome now  . Asthma    mild- not routine use of meds or inhalers  . BURSITIS, LEFT SHOULDER 12/18/2006  . 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 DEPRESSION 09/25/2006  . GLAUCOMA NOS 09/25/2006  . History of hiatal hernia   . HYPERTENSION 09/25/2006  . MIGRAINE HEADACHE 10/14/2006  . OSTEOPOROSIS NOS 10/16/2006  . PELVIC REGION, PAIN 12/17/2009  . RESTLESS LEG SYNDROME, SEVERE 10/16/2006   not bothered now  . SYNDROME, PREMENSTRUAL TENSION 10/14/2006    Past Surgical History:  Procedure Laterality Date  . ABDOMINAL HYSTERECTOMY  1992 ,  [place] total in 1994   [place]-fibro[place]   . BREAST BIOPSY Right   . ESOPHAGOGASTRODUODENOSCOPY (EGD) WITH PROPOFOL N/A 02/13/2013   Procedure: ESOPHAGOGASTRODUODENOSCOPY (EGD) WITH PROPOFOL;  Surgeon: Arta Silence, MD;  Location: [facility];  Service: Endoscopy;  Laterality: N/A;  . EUS N/A 02/13/2013   Procedure: ESOPHAGEAL ENDOSCOPIC ULTRASOUND (EUS)  RADIAL;  Surgeon: Arta Silence, MD;  Location: [facility];  Service: Endoscopy;  Laterality: N/A;  . PARATHYROIDECTOMY N/A 10/18/2018   Procedure: PARATHYROIDECTOMY;  Surgeon: Armandina Gemma, MD;  Location: [facility];  Service: General;  Laterality: N/A;    History reviewed. No pertinent family history. Social History:  reports that she has never smoked. She has never used smokeless tobacco. She reports current alcohol use. She reports that she does not use drugs.  Allergies: No Known Allergies  (Not in a hospital admission)   Results for orders placed or performed during the hospital encounter of 10/22/18 (from the past 48 hour(s))  CBC     Status: None   Collection Time: 10/22/18  9:44 PM  Result Value Ref Range   WBC 4.1 4.0 - 10.5 K/uL   RBC 4.27 3.87 - 5.11 MIL/uL   Hemoglobin 12.6 12.0 - 15.0 g/dL   HCT 40.0 36.0 - 46.0 %   MCV 93.7 80.0 - 100.0 fL   MCH 29.5 26.0 - 34.0 pg   MCHC 31.5 30.0 - 36.0 g/dL   RDW 13.9 11.5 - 15.5 %   Platelets 295 150 - 400 K/uL   nRBC 0.0 0.0 - 0.2 %    Comment: Performed at [facility], [address] [place], [place] [address]   No results found.  Review of Systems  Constitutional: Negative for chills, diaphoresis and fever.  HENT:       Neck swelling and neck pain  Eyes: Negative.   Respiratory: Negative.   Cardiovascular: Negative.   Gastrointestinal: Negative.  Genitourinary: Negative.   Musculoskeletal: Positive for neck pain.  Skin: Negative.   Neurological: Negative.   Endo/Heme/Allergies: Negative.   Psychiatric/Behavioral: Negative.     Blood pressure (!) 157/91, pulse (!) 59, temperature 98.7 F (37.1 C), temperature source Oral, resp. rate 17, height 4\' 11"  (1.499 m), weight 64.9 kg, SpO2 96 %. Physical Exam  Constitutional: She is oriented to person, place, and time. She appears well-developed and well-nourished. No distress.  HENT:  Head: Normocephalic and atraumatic.  Right Ear: External ear  normal.  Left Ear: External ear normal.  Eyes: Pupils are equal, round, and reactive to light. Conjunctivae are normal. No scleral icterus.  Neck: No JVD present. No tracheal deviation present.  Diffuse neck swelling anteriorly, equal bilaterally; soft but tender to palpation; no stridor; surgical incision dry and intact with Dermabond in place; normal speaking voice  Cardiovascular: Normal rate, regular rhythm and normal heart sounds.  No murmur heard. Respiratory: Effort normal and breath sounds normal. No stridor. No respiratory distress. She has no wheezes.  Musculoskeletal: Normal range of motion.        General: No deformity or edema.  Lymphadenopathy:    She has no cervical adenopathy.  Neurological: She is alert and oriented to person, place, and time.  Skin: Skin is warm and dry. She is not diaphoretic.  Psychiatric: She has a normal mood and affect. Her behavior is normal.     Assessment/Plan Acute onset anterior neck swelling - POD#4 from minimally invasive parathyroidectomy - ?etiology  Monitor continuously in ER  CT neck with IV contrast  Will plan to admit for observation overnight pending CT scan results  Discussed with patient who agrees with plans for CT scan and observation.  May require operative intervention pending CT scan results and clinical course.  Armandina Gemma, [facility] Office: [place], MD 10/22/2018, 10:40 PM

## 2018-10-22 NOTE — ED Provider Notes (Signed)
[facility] DEPT Provider Note   CSN: [id number] Arrival date & time: 10/22/18  2048    History   Chief Complaint Chief Complaint  Patient presents with  . Oral Swelling    HPI Rhonda Bush is a 68 y.o. female.     HPI Patient presents with neck swelling.  4 days ago had a parathyroidectomy by Dr. Harlow Asa.  Had been doing well up until around 7:00 tonight when she developed swelling on the left side of her neck.  Some pain at the site but no difficulty breathing.  No difficulty swallowing.  She is not on anticoagulation. Past Medical History:  Diagnosis Date  . Arthritis    rheumatoid arthritis -mild(feet,ankles,hand)-not bothersome now  . Asthma    mild- not routine use of meds or inhalers  . BURSITIS, LEFT SHOULDER 12/18/2006  . 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 DEPRESSION 09/25/2006  . GLAUCOMA NOS 09/25/2006  . History of hiatal hernia   . HYPERTENSION 09/25/2006  . MIGRAINE HEADACHE 10/14/2006  . OSTEOPOROSIS NOS 10/16/2006  . PELVIC REGION, PAIN 12/17/2009  . RESTLESS LEG SYNDROME, SEVERE 10/16/2006   not bothered now  . SYNDROME, PREMENSTRUAL TENSION 10/14/2006    Patient Active Problem List   Diagnosis Date Noted  . Neck swelling 10/22/2018  . Hyperparathyroidism, primary ([place]) 10/13/2018  . 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 BURSITIS, LEFT SHOULDER 12/18/2006  . RESTLESS LEG SYNDROME, SEVERE 10/16/2006  . OSTEOPOROSIS NOS 10/16/2006  . MIGRAINE HEADACHE 10/14/2006  . SYNDROME, PREMENSTRUAL TENSION 10/14/2006  . DEPRESSION 09/25/2006  . GLAUCOMA NOS 09/25/2006  . HYPERTENSION 09/25/2006    Past Surgical History:  Procedure Laterality Date  . ABDOMINAL HYSTERECTOMY  1992 ,  [place] in 1994   [place]-fib[place]   . BREAST BIOPSY Right   . ESOPHAGOGASTRODUODENOSCOPY (EGD) WITH PROPOFOL N/A 02/13/2013   Procedure: ESOPHAGOGASTRODUODENOSCOPY (EGD) WITH PROPOFOL;  Surgeon: Arta Silence, MD;  Location: [facility];  Service: Endoscopy;  Laterality: N/A;  . EUS N/A 02/13/2013   Procedure: ESOPHAGEAL ENDOSCOPIC ULTRASOUND (EUS) RADIAL;  Surgeon: Arta Silence, MD;  Location: [facility];  Service: Endoscopy;  Laterality: N/A;  . PARATHYROIDECTOMY N/A 10/18/2018   Procedure: PARATHYROIDECTOMY;  Surgeon: Armandina Gemma, MD;  Location: [facility];  Service: General;  Laterality: N/A;     OB History   No obstetric history on file.      Home Medications    Prior to Admission medications   Medication Sig Start Date End Date Taking? Authorizing Provider  amLODipine (NORVASC) 5 MG tablet Take 5 mg by mouth daily.    [provider]  aspirin 81 MG tablet Take 81 mg by mouth daily.     [provider]  cholecalciferol (VITAMIN D3) 25 MCG (1000 UT) tablet Take 1,000 Units by mouth daily.    [provider]  dorzolamide (TRUSOPT) 2 % ophthalmic solution Place 1 drop into both eyes 2 (two) times daily.    [provider]  FLUoxetine HCl 60 MG TABS Take 60 mg by mouth daily.    [provider]  folic acid (FOLVITE) 1 MG tablet Take 1 mg by mouth daily.    [provider]  latanoprost (XALATAN) 0.005 % ophthalmic solution Place 1 drop into both eyes at bedtime.    [provider]  methotrexate (RHEUMATREX) 2.5 MG tablet Take 12.5 mg by mouth every Monday.    [provider]  metoprolol succinate (TOPROL-XL) 100  MG 24 hr tablet Take 100 mg by mouth daily. Take with or immediately following a meal.    [provider]  SUMAtriptan (IMITREX) 100 MG tablet Take 100 mg by mouth every 2 (two) hours as needed for migraine. May repeat in 2 hours if headache persists or recurs.    [provider]  telmisartan (MICARDIS) 80 MG tablet Take 80 mg by mouth daily.    [provider]  traMADol (ULTRAM) 50 MG tablet Take 1-2 tablets (50-100 mg total) by mouth every 6 (six) hours as needed. 10/18/18   Armandina Gemma, MD    Family History History reviewed. No pertinent family history.  Social History Social History   Tobacco Use  . Smoking status: Never Smoker  . Smokeless tobacco: Never Used  Substance Use Topics  . Alcohol use: Yes    Comment: very rare  . Drug use: No     Allergies   Patient has no known allergies.   Review of Systems Review of Systems  Constitutional: Negative for appetite change.  HENT: Negative for congestion, sore throat and trouble swallowing.        Neck swelling.  Respiratory: Negative for shortness of breath.   Cardiovascular: Negative for chest pain.  Gastrointestinal: Negative for abdominal pain.  Musculoskeletal: Positive for neck pain.  Skin: Negative for wound.  Neurological: Negative for weakness.     Physical Exam Updated Vital Signs BP (!) 157/91 (BP Location: Left Arm)   Pulse (!) 59   Temp 98.7 F (37.1 C) (Oral)   Resp 17   Ht 4\' 11"  (1.499 m)   Wt 64.9 kg   SpO2 96%   BMI 28.88 kg/m   Physical Exam Vitals signs and nursing note reviewed.  HENT:     Head: Normocephalic.  Neck:     Musculoskeletal: Neck supple.     Comments: Left lower anterior wound from recent surgery.  There is swelling superior to this.  Involves much of the left side of the neck and somewhat over the right.  Not fluctuant.  No erythema.  Not pulsatile.  Some tenderness. Cardiovascular:     Rate and Rhythm: Normal rate.  Pulmonary:     Effort: Pulmonary effort is normal.     Breath sounds: No stridor. No wheezing or rhonchi.  Musculoskeletal:     Right lower leg: No edema.     Left lower leg: No edema.  Skin:    General: Skin is warm.     Capillary Refill: Capillary refill takes less than 2 seconds.  Neurological:     Mental Status: She is alert. Mental status is at baseline.      ED Treatments / Results  Labs (all labs ordered are listed, but only abnormal results are displayed) Labs Reviewed  SARS CORONAVIRUS 2 (HOSPITAL ORDER, [facility])  CBC  BASIC METABOLIC PANEL    EKG None  Radiology No results found.  Procedures Procedures (including critical care time)  Medications Ordered in ED Medications - No data to display   Initial Impression / Assessment and Plan / ED Course  I have reviewed the triage vital signs and the nursing notes.  Pertinent labs & imaging results that were available during my care of the patient were reviewed by me and considered in my medical decision making (see chart for details).        *Patient with neck swelling post parathyroidectomy 4 days ago.  Seen in the ER by  Dr.Gerkin, who states he will likely keep her overnight in the hospital.  CT scan pending at this time however.  Care will be turned over to oncoming provider.  Final Clinical Impressions(s) / ED Diagnoses   Final diagnoses:  None    ED Discharge Orders    None       Davonna Belling, MD 10/22/18 2300

## 2018-10-23 ENCOUNTER — Observation Stay (HOSPITAL_COMMUNITY): Payer: BC Managed Care – PPO | Admitting: Certified Registered"

## 2018-10-23 ENCOUNTER — Encounter (HOSPITAL_COMMUNITY)
Admission: EM | Disposition: A | Discharge status: Home / Self Care | Payer: Self-pay | Source: Home / Self Care | Attending: Surgery

## 2018-10-23 ENCOUNTER — Encounter (HOSPITAL_COMMUNITY): Payer: Self-pay | Admitting: *Deleted

## 2018-10-23 DIAGNOSIS — S1093XA Contusion of unspecified part of neck, initial encounter: Secondary | ICD-10-CM | POA: Diagnosis present

## 2018-10-23 DIAGNOSIS — L7622 Postprocedural hemorrhage and hematoma of skin and subcutaneous tissue following other procedure: Secondary | ICD-10-CM | POA: Diagnosis not present

## 2018-10-23 HISTORY — PX: PARATHYROIDECTOMY: SHX19

## 2018-10-23 LAB — CBC
HCT: 40.9 % (ref 36.0–46.0)
Hemoglobin: 12.6 g/dL (ref 12.0–15.0)
MCH: 28.9 pg (ref 26.0–34.0)
MCHC: 30.8 g/dL (ref 30.0–36.0)
MCV: 93.8 fL (ref 80.0–100.0)
Platelets: 281 10*3/uL (ref 150–400)
RBC: 4.36 MIL/uL (ref 3.87–5.11)
RDW: 13.6 % (ref 11.5–15.5)
WBC: 4.4 10*3/uL (ref 4.0–10.5)
nRBC: 0 % (ref 0.0–0.2)

## 2018-10-23 LAB — APTT: aPTT: 30 seconds (ref 24–36)

## 2018-10-23 LAB — PROTIME-INR
INR: 0.9 (ref 0.8–1.2)
Prothrombin Time: 12.4 seconds (ref 11.4–15.2)

## 2018-10-23 LAB — MRSA PCR SCREENING: MRSA by PCR: NEGATIVE

## 2018-10-23 SURGERY — PARATHYROIDECTOMY
Anesthesia: General | Site: Neck

## 2018-10-23 MED ORDER — LACTATED RINGERS IV SOLN
INTRAVENOUS | Status: DC
  Administered 2018-10-23: 14:00:00 via INTRAVENOUS

## 2018-10-23 MED ORDER — TRAMADOL HCL 50 MG PO TABS: 50.0000 mg | ORAL_TABLET | Freq: Four times a day (QID) | ORAL | Status: DC | PRN

## 2018-10-23 MED ORDER — BUPIVACAINE HCL (PF) 0.25 % IJ SOLN
INTRAMUSCULAR | Status: AC
  Filled 2018-10-23: qty 30

## 2018-10-23 MED ORDER — METHOTREXATE 2.5 MG PO TABS: 12.5000 mg | ORAL_TABLET | ORAL | Status: DC

## 2018-10-23 MED ORDER — SUCCINYLCHOLINE CHLORIDE 200 MG/10ML IV SOSY
PREFILLED_SYRINGE | INTRAVENOUS | Status: DC | PRN
  Administered 2018-10-23: 100 mg via INTRAVENOUS

## 2018-10-23 MED ORDER — MEPERIDINE HCL 50 MG/ML IJ SOLN: 6.2500 mg | INTRAMUSCULAR | Status: DC | PRN

## 2018-10-23 MED ORDER — LIDOCAINE 2% (20 MG/ML) 5 ML SYRINGE
INTRAMUSCULAR | Status: AC
  Filled 2018-10-23: qty 5

## 2018-10-23 MED ORDER — HYDROCODONE-ACETAMINOPHEN 5-325 MG PO TABS: 1.0000 | ORAL_TABLET | ORAL | Status: DC | PRN

## 2018-10-23 MED ORDER — PHENYLEPHRINE 40 MCG/ML (10ML) SYRINGE FOR IV PUSH (FOR BLOOD PRESSURE SUPPORT)
PREFILLED_SYRINGE | INTRAVENOUS | Status: DC | PRN
  Administered 2018-10-23: 80 ug via INTRAVENOUS
  Administered 2018-10-23 (×2): 120 ug via INTRAVENOUS

## 2018-10-23 MED ORDER — ROCURONIUM BROMIDE 10 MG/ML (PF) SYRINGE
PREFILLED_SYRINGE | INTRAVENOUS | Status: AC
  Filled 2018-10-23: qty 10

## 2018-10-23 MED ORDER — ONDANSETRON 4 MG PO TBDP: 4.0000 mg | ORAL_TABLET | Freq: Four times a day (QID) | ORAL | Status: DC | PRN

## 2018-10-23 MED ORDER — FENTANYL CITRATE (PF) 100 MCG/2ML IJ SOLN: 25.0000 ug | INTRAMUSCULAR | Status: DC | PRN

## 2018-10-23 MED ORDER — 0.9 % SODIUM CHLORIDE (POUR BTL) OPTIME
TOPICAL | Status: DC | PRN
  Administered 2018-10-23: 1000 mL

## 2018-10-23 MED ORDER — CHLORHEXIDINE GLUCONATE CLOTH 2 % EX PADS: 6.0000 | MEDICATED_PAD | Freq: Once | CUTANEOUS | Status: DC

## 2018-10-23 MED ORDER — KCL IN DEXTROSE-NACL 20-5-0.45 MEQ/L-%-% IV SOLN: INTRAVENOUS | Status: DC

## 2018-10-23 MED ORDER — AMLODIPINE BESYLATE 5 MG PO TABS
5.0000 mg | ORAL_TABLET | Freq: Every day | ORAL | Status: DC
  Administered 2018-10-24 – 2018-10-25 (×2): 5 mg via ORAL
  Filled 2018-10-23 (×2): qty 1

## 2018-10-23 MED ORDER — OXYCODONE HCL 5 MG PO TABS: 5.0000 mg | ORAL_TABLET | Freq: Once | ORAL | Status: DC | PRN

## 2018-10-23 MED ORDER — ACETAMINOPHEN 650 MG RE SUPP: 650.0000 mg | Freq: Four times a day (QID) | RECTAL | Status: DC | PRN

## 2018-10-23 MED ORDER — ROCURONIUM BROMIDE 10 MG/ML (PF) SYRINGE
PREFILLED_SYRINGE | INTRAVENOUS | Status: DC | PRN
  Administered 2018-10-23: 40 mg via INTRAVENOUS

## 2018-10-23 MED ORDER — SUGAMMADEX SODIUM 500 MG/5ML IV SOLN
INTRAVENOUS | Status: AC
  Filled 2018-10-23: qty 5

## 2018-10-23 MED ORDER — ONDANSETRON HCL 4 MG/2ML IJ SOLN
INTRAMUSCULAR | Status: AC
  Filled 2018-10-23: qty 2

## 2018-10-23 MED ORDER — PHENYLEPHRINE 40 MCG/ML (10ML) SYRINGE FOR IV PUSH (FOR BLOOD PRESSURE SUPPORT)
PREFILLED_SYRINGE | INTRAVENOUS | Status: AC
  Filled 2018-10-23: qty 10

## 2018-10-23 MED ORDER — KCL IN DEXTROSE-NACL 20-5-0.45 MEQ/L-%-% IV SOLN
INTRAVENOUS | Status: DC
  Administered 2018-10-23 (×2): via INTRAVENOUS
  Filled 2018-10-23 (×2): qty 1000

## 2018-10-23 MED ORDER — FENTANYL CITRATE (PF) 100 MCG/2ML IJ SOLN
INTRAMUSCULAR | Status: DC | PRN
  Administered 2018-10-23 (×3): 50 ug via INTRAVENOUS

## 2018-10-23 MED ORDER — ACETAMINOPHEN 325 MG PO TABS
650.0000 mg | ORAL_TABLET | Freq: Four times a day (QID) | ORAL | Status: DC | PRN
  Administered 2018-10-24: 650 mg via ORAL
  Filled 2018-10-23: qty 2

## 2018-10-23 MED ORDER — PROPOFOL 10 MG/ML IV BOLUS
INTRAVENOUS | Status: AC
  Filled 2018-10-23: qty 20

## 2018-10-23 MED ORDER — PROPOFOL 10 MG/ML IV BOLUS
INTRAVENOUS | Status: DC | PRN
  Administered 2018-10-23: 120 mg via INTRAVENOUS

## 2018-10-23 MED ORDER — EPHEDRINE 5 MG/ML INJ
INTRAVENOUS | Status: AC
  Filled 2018-10-23: qty 10

## 2018-10-23 MED ORDER — ACETAMINOPHEN 325 MG PO TABS: 325.0000 mg | ORAL_TABLET | ORAL | Status: DC | PRN

## 2018-10-23 MED ORDER — EPHEDRINE SULFATE-NACL 50-0.9 MG/10ML-% IV SOSY
PREFILLED_SYRINGE | INTRAVENOUS | Status: DC | PRN
  Administered 2018-10-23 (×2): 10 mg via INTRAVENOUS
  Administered 2018-10-23: 15 mg via INTRAVENOUS
  Administered 2018-10-23: 10 mg via INTRAVENOUS
  Administered 2018-10-23: 15 mg via INTRAVENOUS

## 2018-10-23 MED ORDER — HYDROMORPHONE HCL 1 MG/ML IJ SOLN: 1.0000 mg | INTRAMUSCULAR | Status: DC | PRN

## 2018-10-23 MED ORDER — ONDANSETRON HCL 4 MG/2ML IJ SOLN: 4.0000 mg | Freq: Once | INTRAMUSCULAR | Status: DC | PRN

## 2018-10-23 MED ORDER — FENTANYL CITRATE (PF) 100 MCG/2ML IJ SOLN
INTRAMUSCULAR | Status: AC
  Filled 2018-10-23: qty 2

## 2018-10-23 MED ORDER — ONDANSETRON HCL 4 MG/2ML IJ SOLN: 4.0000 mg | Freq: Four times a day (QID) | INTRAMUSCULAR | Status: DC | PRN

## 2018-10-23 MED ORDER — CEFAZOLIN SODIUM-DEXTROSE 2-4 GM/100ML-% IV SOLN
INTRAVENOUS | Status: AC
  Filled 2018-10-23: qty 100

## 2018-10-23 MED ORDER — FLUOXETINE HCL 20 MG PO CAPS
60.0000 mg | ORAL_CAPSULE | Freq: Every day | ORAL | Status: DC
  Administered 2018-10-24 – 2018-10-25 (×2): 60 mg via ORAL
  Filled 2018-10-23 (×2): qty 3

## 2018-10-23 MED ORDER — OXYCODONE HCL 5 MG/5ML PO SOLN: 5.0000 mg | Freq: Once | ORAL | Status: DC | PRN

## 2018-10-23 MED ORDER — ACETAMINOPHEN 500 MG PO TABS
1000.0000 mg | ORAL_TABLET | Freq: Four times a day (QID) | ORAL | Status: DC
  Administered 2018-10-23 – 2018-10-24 (×2): 1000 mg via ORAL
  Filled 2018-10-23 (×2): qty 2

## 2018-10-23 MED ORDER — DORZOLAMIDE HCL 2 % OP SOLN
1.0000 [drp] | Freq: Two times a day (BID) | OPHTHALMIC | Status: DC
  Administered 2018-10-23 – 2018-10-25 (×4): 1 [drp] via OPHTHALMIC
  Filled 2018-10-23: qty 10

## 2018-10-23 MED ORDER — METOPROLOL SUCCINATE ER 50 MG PO TB24
100.0000 mg | ORAL_TABLET | Freq: Every day | ORAL | Status: DC
  Administered 2018-10-24 – 2018-10-25 (×2): 100 mg via ORAL
  Filled 2018-10-23: qty 2
  Filled 2018-10-23: qty 4

## 2018-10-23 MED ORDER — CHLORHEXIDINE GLUCONATE CLOTH 2 % EX PADS
6.0000 | MEDICATED_PAD | Freq: Every day | CUTANEOUS | Status: DC
  Administered 2018-10-23: 6 via TOPICAL

## 2018-10-23 MED ORDER — ONDANSETRON HCL 4 MG/2ML IJ SOLN
INTRAMUSCULAR | Status: DC | PRN
  Administered 2018-10-23: 4 mg via INTRAVENOUS

## 2018-10-23 MED ORDER — METHOTREXATE (ANTI-RHEUMATIC) 2.5 MG PO TABS: 12.5000 mg | ORAL_TABLET | ORAL | Status: DC

## 2018-10-23 MED ORDER — SUCCINYLCHOLINE CHLORIDE 200 MG/10ML IV SOSY
PREFILLED_SYRINGE | INTRAVENOUS | Status: AC
  Filled 2018-10-23: qty 10

## 2018-10-23 MED ORDER — ACETAMINOPHEN 160 MG/5ML PO SOLN: 325.0000 mg | ORAL | Status: DC | PRN

## 2018-10-23 MED ORDER — SUGAMMADEX SODIUM 200 MG/2ML IV SOLN
INTRAVENOUS | Status: DC | PRN
  Administered 2018-10-23: 250 mg via INTRAVENOUS

## 2018-10-23 MED ORDER — LIDOCAINE 2% (20 MG/ML) 5 ML SYRINGE
INTRAMUSCULAR | Status: DC | PRN
  Administered 2018-10-23 (×2): 50 mg via INTRAVENOUS

## 2018-10-23 MED ORDER — DEXAMETHASONE SODIUM PHOSPHATE 10 MG/ML IJ SOLN
INTRAMUSCULAR | Status: AC
  Filled 2018-10-23: qty 1

## 2018-10-23 MED ORDER — CEFAZOLIN SODIUM-DEXTROSE 2-4 GM/100ML-% IV SOLN
2.0000 g | INTRAVENOUS | Status: AC
  Administered 2018-10-23: 2 g via INTRAVENOUS

## 2018-10-23 MED ORDER — IRBESARTAN 300 MG PO TABS
300.0000 mg | ORAL_TABLET | Freq: Every day | ORAL | Status: DC
  Administered 2018-10-24 – 2018-10-25 (×2): 300 mg via ORAL
  Filled 2018-10-23 (×2): qty 1

## 2018-10-23 MED ORDER — DEXAMETHASONE SODIUM PHOSPHATE 10 MG/ML IJ SOLN
INTRAMUSCULAR | Status: DC | PRN
  Administered 2018-10-23: 4 mg via INTRAVENOUS

## 2018-10-23 MED ORDER — LATANOPROST 0.005 % OP SOLN
1.0000 [drp] | Freq: Every day | OPHTHALMIC | Status: DC
  Administered 2018-10-23 – 2018-10-24 (×2): 1 [drp] via OPHTHALMIC
  Filled 2018-10-23: qty 2.5

## 2018-10-23 MED ORDER — MIDAZOLAM HCL 2 MG/2ML IJ SOLN
INTRAMUSCULAR | Status: AC
  Filled 2018-10-23: qty 2

## 2018-10-23 SURGICAL SUPPLY — 34 items
ATTRACTOMAT 16X20 MAGNETIC DRP (DRAPES) IMPLANT
BLADE SURG 15 STRL LF DISP TIS (BLADE) ×1 IMPLANT
BLADE SURG 15 STRL SS (BLADE) ×1
CHLORAPREP W/TINT 26 (MISCELLANEOUS) ×2 IMPLANT
CLIP VESOCCLUDE MED 6/CT (CLIP) ×2 IMPLANT
CLIP VESOCCLUDE SM WIDE 6/CT (CLIP) ×2 IMPLANT
COVER SURGICAL LIGHT HANDLE (MISCELLANEOUS) ×2 IMPLANT
COVER WAND RF STERILE (DRAPES) ×2 IMPLANT
DERMABOND ADVANCED (GAUZE/BANDAGES/DRESSINGS)
DERMABOND ADVANCED .7 DNX12 (GAUZE/BANDAGES/DRESSINGS) IMPLANT
DRAPE LAPAROTOMY T 98X78 PEDS (DRAPES) ×2 IMPLANT
ELECT PENCIL ROCKER SW 15FT (MISCELLANEOUS) ×2 IMPLANT
ELECT REM PT RETURN 15FT ADLT (MISCELLANEOUS) ×2 IMPLANT
EVACUATOR DRAINAGE 7X20 100CC (MISCELLANEOUS) ×1 IMPLANT
EVACUATOR SILICONE 100CC (MISCELLANEOUS) ×1
GAUZE 4X4 16PLY RFD (DISPOSABLE) ×2 IMPLANT
GAUZE SPONGE 4X4 12PLY STRL (GAUZE/BANDAGES/DRESSINGS) ×2 IMPLANT
GLOVE SURG ORTHO 8.0 STRL STRW (GLOVE) ×2 IMPLANT
GOWN STRL REUS W/TWL XL LVL3 (GOWN DISPOSABLE) ×4 IMPLANT
HEMOSTAT SURGICEL 2X4 FIBR (HEMOSTASIS) IMPLANT
ILLUMINATOR WAVEGUIDE N/F (MISCELLANEOUS) IMPLANT
KIT BASIN OR (CUSTOM PROCEDURE TRAY) ×2 IMPLANT
KIT TURNOVER KIT A (KITS) ×2 IMPLANT
NEEDLE HYPO 25X1 1.5 SAFETY (NEEDLE) IMPLANT
PACK BASIC VI WITH GOWN DISP (CUSTOM PROCEDURE TRAY) ×2 IMPLANT
STRIP CLOSURE SKIN 1/2X4 (GAUZE/BANDAGES/DRESSINGS) ×2 IMPLANT
SUT ETHILON 2 0 PS N (SUTURE) ×4 IMPLANT
SUT MNCRL AB 4-0 PS2 18 (SUTURE) ×2 IMPLANT
SUT VIC AB 3-0 SH 18 (SUTURE) ×4 IMPLANT
SYR BULB IRRIGATION 50ML (SYRINGE) ×2 IMPLANT
SYR CONTROL 10ML LL (SYRINGE) IMPLANT
TOWEL OR 17X26 10 PK STRL BLUE (TOWEL DISPOSABLE) ×2 IMPLANT
TOWEL OR NON WOVEN STRL DISP B (DISPOSABLE) ×2 IMPLANT
TUBING CONNECTING 10 (TUBING) ×2 IMPLANT

## 2018-10-23 NOTE — Anesthesia Postprocedure Evaluation (Deleted)
Anesthesia Post Note  Patient: Rhonda Bush  Procedure(s) Performed: NECK EXPLORATION, EVACUATION OF HEMATOMA (N/A Neck)     Patient location during evaluation: PACU Anesthesia Type: General Level of consciousness: awake and alert Pain management: pain level controlled Vital Signs Assessment: post-procedure vital signs reviewed and stable Respiratory status: spontaneous breathing, nonlabored ventilation, respiratory function stable and patient connected to nasal cannula oxygen Cardiovascular status: blood pressure returned to baseline and stable Postop Assessment: no apparent nausea or vomiting Anesthetic complications: no    Last Vitals:  Vitals:   10/23/18 1328 10/23/18 1515  BP: 139/70 (!) 154/96  Pulse: 61 78  Resp: 16 18  Temp: 36.9 C 36.9 C  SpO2: 100% 100%    Last Pain:  Vitals:   10/23/18 1515  TempSrc:   PainSc: 0-No pain                 Laydon Martis

## 2018-10-23 NOTE — Anesthesia Postprocedure Evaluation (Signed)
Anesthesia Post Note  Patient: Rhonda Bush  Procedure(s) Performed: NECK EXPLORATION, EVACUATION OF HEMATOMA (N/A Neck)     Patient location during evaluation: PACU Anesthesia Type: General Level of consciousness: awake and alert Pain management: pain level controlled Vital Signs Assessment: post-procedure vital signs reviewed and stable Respiratory status: spontaneous breathing, nonlabored ventilation, respiratory function stable and patient connected to nasal cannula oxygen Cardiovascular status: blood pressure returned to baseline and stable Postop Assessment: no apparent nausea or vomiting Anesthetic complications: no    Last Vitals:  Vitals:   10/23/18 1328 10/23/18 1515  BP: 139/70 (!) 154/96  Pulse: 61 78  Resp: 16 18  Temp: 36.9 C 36.9 C  SpO2: 100% 100%    Last Pain:  Vitals:   10/23/18 1515  TempSrc:   PainSc: 0-No pain                 Barnet Glasgow

## 2018-10-23 NOTE — ED Notes (Signed)
ED TO INPATIENT HANDOFF REPORT  Name/Age/Gender Rhonda Bush 68 y.o. female  Code Status    Code Status Orders  (From admission, onward)         Start     Ordered   10/23/18 0054  Full code  Continuous     10/23/18 0057        Code Status History    This patient has a current code status but no historical code status.   Advance Care Planning Activity      Home/SNF/Other Home  Chief Complaint neck swelling; thyroid surgery 10/18/2018  Level of Care/Admitting Diagnosis ED Disposition    ED Disposition Condition Comment   Admit  Hospital Area: [facility] [[id number]]  Level of Care: Stepdown [14]  Admit to SDU based on following criteria: Other see comments  Admit to SDU based on following criteria: Respiratory Distress:  Frequent assessment and/or intervention to maintain adequate ventilation/respiration, pulmonary toilet, and respiratory treatment.  Comments: hematoma of neck post op parathyroidectomy  Covid Evaluation: Confirmed COVID Negative  Diagnosis: Hematoma of neck [425956]  Admitting Physician: Armandina Gemma Lorry.Blower  Attending Physician: Deborah Chalk  Bed request comments: stepdown  PT Class (Do Not Modify): Observation [104]  PT Acc Code (Do Not Modify): Observation [10022]       Medical History Past Medical History:  Diagnosis Date  . Arthritis    rheumatoid arthritis -mild(feet,ankles,hand)-not bothersome now  . Asthma    mild- not routine use of meds or inhalers  . BURSITIS, LEFT SHOULDER 12/18/2006  . 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 DEPRESSION 09/25/2006  . GLAUCOMA NOS 09/25/2006  . History of hiatal hernia   . HYPERTENSION 09/25/2006  . MIGRAINE HEADACHE 10/14/2006  . OSTEOPOROSIS NOS 10/16/2006  . PELVIC REGION, PAIN 12/17/2009  . RESTLESS LEG SYNDROME, SEVERE 10/16/2006   not bothered now  . SYNDROME, PREMENSTRUAL TENSION 10/14/2006    Allergies No Known Allergies  IV  Location/Drains/Wounds Patient Lines/Drains/Airways Status   Active Line/Drains/Airways    Name:   Placement date:   Placement time:   Site:   Days:   Peripheral IV 02/13/13 Right Hand   02/13/13    0727    Hand   2078   Peripheral IV 10/22/18 Right Antecubital   10/22/18    2145    Antecubital   1   Incision (Closed) 10/18/18 Neck Other (Comment)   10/18/18    0804     5          Labs/Imaging Results for orders placed or performed during the hospital encounter of 10/22/18 (from the past 48 hour(s))  CBC     Status: None   Collection Time: 10/22/18  9:44 PM  Result Value Ref Range   WBC 4.1 4.0 - 10.5 K/uL   RBC 4.27 3.87 - 5.11 MIL/uL   Hemoglobin 12.6 12.0 - 15.0 g/dL   HCT 40.0 36.0 - 46.0 %   MCV 93.7 80.0 - 100.0 fL   MCH 29.5 26.0 - 34.0 pg   MCHC 31.5 30.0 - 36.0 g/dL   RDW 13.9 11.5 - 15.5 %   Platelets 295 150 - 400 K/uL   nRBC 0.0 0.0 - 0.2 %    Comment: Performed at [facility], [place] [address] [place], [place] [address]  SARS Coronavirus 2 [facility] order, Performed in [facility] hospital lab) Nasopharyngeal Nasopharyngeal Swab     Status: None   Collection Time: 10/22/18  9:55 PM   Specimen: Nasopharyngeal Swab  Result Value Ref Range   SARS Coronavirus 2 NEGATIVE NEGATIVE    Comment: (NOTE) If result is NEGATIVE SARS-CoV-2 target nucleic acids are NOT DETECTED. The SARS-CoV-2 RNA is generally detectable in upper and lower  respiratory specimens during the acute phase of infection. The lowest  concentration of SARS-CoV-2 viral copies this assay can detect is 250  copies / mL. A negative result does not preclude SARS-CoV-2 infection  and should not be used as the sole basis for treatment or other  patient management decisions.  A negative result may occur with  improper specimen collection / handling, submission of specimen other  than nasopharyngeal swab, presence of viral mutation(s) within the  areas targeted by this assay, and  inadequate number of viral copies  (<250 copies / mL). A negative result must be combined with clinical  observations, patient history, and epidemiological information. If result is POSITIVE SARS-CoV-2 target nucleic acids are DETECTED. The SARS-CoV-2 RNA is generally detectable in upper and lower  respiratory specimens dur ing the acute phase of infection.  Positive  results are indicative of active infection with SARS-CoV-2.  Clinical  correlation with patient history and other diagnostic information is  necessary to determine patient infection status.  Positive results do  not rule out bacterial infection or co-infection with other viruses. If result is PRESUMPTIVE POSTIVE SARS-CoV-2 nucleic acids MAY BE PRESENT.   A presumptive positive result was obtained on the submitted specimen  and confirmed on repeat testing.  While 2019 novel coronavirus  (SARS-CoV-2) nucleic acids may be present in the submitted sample  additional confirmatory testing may be necessary for epidemiological  and / or clinical management purposes  to differentiate between  SARS-CoV-2 and other Sarbecovirus currently known to infect humans.  If clinically indicated additional testing with an alternate test  methodology [phone]) is advised. The SARS-CoV-2 RNA is generally  detectable in upper and lower respiratory sp ecimens during the acute  phase of infection. The expected result is Negative. Fact Sheet for Patients:  StrictlyIdeas.no Fact Sheet for Healthcare Providers: BankingDealers.co.za This test is not yet approved or cleared by the [place] FDA and has been authorized for detection and/or diagnosis of SARS-CoV-2 by FDA under an Emergency Use Authorization (EUA).  This EUA will remain in effect (meaning this test can be used) for the duration of the COVID-19 declaration under Section 564(b)(1) of the Act, 21 U.S.C. section 360bbb-3(b)(1), unless the  authorization is terminated or revoked sooner. Performed at [facility], [place] [address], [place], [place] [address]   Basic metabolic panel     Status: Abnormal   Collection Time: 10/22/18 10:30 PM  Result Value Ref Range   Sodium 141 135 - 145 mmol/L   Potassium 4.2 3.5 - 5.1 mmol/L   Chloride 108 98 - 111 mmol/L   CO2 24 22 - 32 mmol/L   Glucose, Bld 112 (H) 70 - 99 mg/dL   BUN 16 8 - 23 mg/dL   Creatinine, Ser 0.68 0.44 - 1.00 mg/dL   Calcium 8.9 8.9 - 10.3 mg/dL   GFR calc non Af Amer >60 >60 mL/min   GFR calc Af Amer >60 >60 mL/min   Anion gap 9 5 - 15    Comment: Performed at [facility], [place] [address], [place], [place] [address]   Ct Soft Tissue Neck W Contrast  Addendum Date: 10/23/2018   ADDENDUM REPORT: 10/23/2018 00:11 ADDENDUM: Finding of suspected  postoperative hematoma was relayed to Dr. Harlow Asa in the OR via telephone at 0003 hours. Electronically Signed   By: Genevie Ann M.D.   On: 10/23/2018 00:11   Result Date: 10/23/2018 CLINICAL DATA:  68 year old female postoperative day 4 parathyroidectomy. Swelling. EXAM: CT NECK WITH CONTRAST TECHNIQUE: Multidetector CT imaging of the neck was performed using the standard protocol following the bolus administration of intravenous contrast. CONTRAST:  6mL OMNIPAQUE IOHEXOL 300 MG/ML  SOLN COMPARISON:  Nuclear medicine sestamibi scan 08/24/2018. Ultrasound 08/24/2018. FINDINGS: Pharynx and larynx: Laryngeal and pharyngeal soft tissue contours remain within normal limits. The superior parapharyngeal spaces are normal. There is mild to moderate retropharyngeal space edema, most pronounced at C3 (series 3, image 38). Salivary glands: Negative sublingual space. Submandibular glands appear normal aside from mild anterior mass effect from the strap muscle abnormality described below. Parotid glands are within normal limits. Thyroid: Thyroid parenchyma appears within normal limits. There are multiple small  surgical clips along the posterior and lateral aspect of the left lobe. Anterior to the thyroid there is heterogeneous left greater than right generalized strap muscle enlargement, up to 2 centimeters in thickness, and with areas of heterogeneous hypoenhancement (series 3, image 54 and coronal image 34). This generalized strap muscle thickening continues cephalad to the level of the hyoid bone. And there is associated thickening of the lower platysma, soft tissue inflammation both superficial and deep to the platysma, and also a degree of abnormal thickening of the lower sternocleidomastoid muscles (also greater on the left). Superimposed small volume of soft tissue gas in the left superior parathyroid space on series 3, images 48 and 50 medial to the left carotid space. There is wispy venous enhancement identified within the areas of abnormal muscle thickening, and questionable venous contrast extravasation on series 3, image 49 anterior to the area of soft tissue gas. Lymph nodes: Mild reactive appearing bilateral level 2 lymph nodes. No enlarged or heterogeneous nodes. Vascular: The major vascular structures in the neck and at the skull base remain patent. There is mild lateral mass effect on the left carotid space related to the abnormal thyroid region findings above. There is soft tissue inflammation tracking cephalad along both carotid spaces from the lower neck, greater on the left. Limited intracranial: Negative. Visualized orbits: Negative. Mastoids and visualized paranasal sinuses: Clear. Skeleton: Bilateral TMJ degeneration. Cervical spine degeneration. No acute or suspicious osseous lesion identified. Upper chest: The inflammation surrounding the thyroid bed tracks toward the pre-vascular space of the superior mediastinum, but the remainder of the visible mediastinum is spared. Negative visible proximal great vessels. Negative visible lungs. IMPRESSION: 1. Moderate to severe heterogeneous enlargement of  the strap muscles is probably due to postoperative hematoma, in association with generalized thyroid region soft tissue inflammation. There is less pronounced thickening of the lower sternocleidomastoid muscles, also on the left. 2. Questionable small area of venous contrast extravasation superior to the left thyroid lobe on series 3, image 49. Small volume of postoperative gas in this region. 3. Mild to moderate edema/inflammation in the retropharyngeal space, tracking along the carotid spaces, and also extending retrosternally but involving only the most superior aspect of the anterior mediastinum. Electronically Signed: By: Genevie Ann M.D. On: 10/22/2018 23:57    Pending Labs Unresulted Labs (From admission, onward)    Start     Ordered   10/23/18 0500  CBC  Tomorrow morning,   R     10/23/18 0057   10/23/18 0500  APTT  Tomorrow morning,  R     10/23/18 0057   10/23/18 0500  Protime-INR  Tomorrow morning,   R     10/23/18 0057          Vitals/Pain Today's Vitals   10/22/18 2230 10/23/18 0042 10/23/18 0300 10/23/18 0345  BP: (!) 157/91 (!) 142/83 (!) 134/56 (!) 142/65  Pulse: (!) 59 (!) 58 60 (!) 57  Resp: 17 18 17 16   Temp:      TempSrc:      SpO2: 96% 98% 97% 98%  Weight:      Height:      PainSc:        Isolation Precautions No active isolations  Medications Medications  sodium chloride (PF) 0.9 % injection (has no administration in time range)  dextrose 5 % and 0.45 % NaCl with KCl 20 mEq/L infusion ( Intravenous New Bag/Given 10/23/18 0156)  acetaminophen (TYLENOL) tablet 650 mg (has no administration in time range)    Or  acetaminophen (TYLENOL) suppository 650 mg (has no administration in time range)  traMADol (ULTRAM) tablet 50 mg (has no administration in time range)  HYDROcodone-acetaminophen (NORCO/VICODIN) 5-325 MG per tablet 1-2 tablet (has no administration in time range)  HYDROmorphone (DILAUDID) injection 1 mg (has no administration in time range)   ondansetron (ZOFRAN-ODT) disintegrating tablet 4 mg (has no administration in time range)    Or  ondansetron (ZOFRAN) injection 4 mg (has no administration in time range)  iohexol (OMNIPAQUE) 300 MG/ML solution 75 mL (75 mLs Intravenous Contrast Given 10/22/18 2332)    Mobility walks

## 2018-10-23 NOTE — Op Note (Signed)
Operative Note  Pre-operative Diagnosis:  Post op bleeding in neck  Post-operative Diagnosis:  same  Surgeon:  Armandina Gemma, MD  Assistant:  none   Procedure:  Neck exploration, evacuation of hematoma, control of bleeding  Anesthesia:  general  Estimated Blood Loss:  75 cc  Drains: 37mm JP drain to thyroid bed on left         Specimen: none  Indications: Patient is a 68 year old female who underwent left minimally invasive parathyroidectomy 4 days prior to admission.  On the evening of the fourth postoperative day she developed sudden onset of anterior neck swelling.  She presented to the emergency department.  She was evaluated by myself and the emergency room physician.  She underwent CT scan of the neck showing probable hematoma without airway compromise.  Patient was admitted to the stepdown unit for observation.  Coagulation studies were normal.  Hemoglobin remained stable.  Clinically however, the swelling became more extensive and there was a change in the quality of the patient's voice.  Therefore she was prepared urgently and brought to the operating room for exploration.  Procedure Details:  The patient was seen in the pre-op holding area. The risks, benefits, complications, treatment options, and expected outcomes were previously discussed with the patient. The patient agreed with the proposed plan and has signed the informed consent form.  The patient was brought to the operating room by the surgical team, identified as Rhonda Bush and the procedure verified. A "time out" was completed and the above information confirmed.  Following administration of general anesthesia, the patient is positioned and then prepped and draped in the usual aseptic fashion.  After ascertaining that an adequate level of anesthesia been achieved, the previous surgical incision is reopened with a #15 blade.  A small amount of hematoma is evacuated from the subcutaneous space.  Strap muscles are  separated in the midline by excising the interrupted sutures.  There is approximately 25 cc of hematoma in the deep space which is evacuated.  There is no evidence of active bleeding.  Incision is extended across the midline to the right and skin flaps are developed cephalad and caudad.  Strap muscles are more widely separated and the deep compartment is irrigated with warm saline.  Upon evacuation of further hematoma from near the left superior pole of the thyroid there is arterial bleeding encountered.  This is controlled with ligaclips and 2 figure-of-eight 3-0 Vicryl sutures.  It is difficult to know whether or not this was the source of the hematoma.  Remainder the neck is then explored and hematoma evacuated.  Neck is irrigated copiously.  No further bleeding is identified.  A 7 mm Jackson-Pratt drain is brought in from a left lateral neck incision and passed through the strap muscles into the deep space.  It is trimmed to the appropriate size and left in the left thyroid bed.  It is secured to the skin with a 2-0 nylon suture.  Strap muscles are reapproximated in the midline of interrupted 3-0 Vicryl sutures.  Platysma was closed with interrupted 3-0 Vicryl sutures.  Skin is closed with a running 4-0 Monocryl subcuticular suture.  Wound is washed and dried.  Drain is placed to bulb suction.  Steri-Strips are applied to the incision.  Dry gauze dressings were placed.  Patient is awakened from anesthesia and brought to the recovery room in stable condition.  The patient tolerated the procedure well.   Armandina Gemma, MD [facility] Office: [phone]

## 2018-10-23 NOTE — Anesthesia Preprocedure Evaluation (Signed)
Anesthesia Evaluation  Patient identified by MRN, date of birth, ID band Patient awake    Reviewed: Allergy & Precautions, NPO status , Patient's Chart, lab work & pertinent test results  Airway Mallampati: I  TM Distance: >3 FB Neck ROM: Full    Dental  (+) Teeth Intact, Dental Advisory Given   Pulmonary asthma ,    breath sounds clear to auscultation       Cardiovascular hypertension, Pt. on medications and Pt. on home beta blockers  Rhythm:Regular Rate:Bradycardia     Neuro/Psych  Headaches, Depression    GI/Hepatic Neg liver ROS, hiatal hernia,   Endo/Other  negative endocrine ROS  Renal/GU negative Renal ROS     Musculoskeletal  (+) Arthritis ,   Abdominal Normal abdominal exam  (+)   Peds  Hematology negative hematology ROS (+)   Anesthesia Other Findings   Reproductive/Obstetrics                            Anesthesia Physical  Anesthesia Plan  ASA: III and emergent  Anesthesia Plan: General   Post-op Pain Management:    Induction: Intravenous  PONV Risk Score and Plan: 4 or greater and Ondansetron, Midazolam, Dexamethasone and Treatment may vary due to age or medical condition  Airway Management Planned: Oral ETT and Video Laryngoscope Planned  Additional Equipment: None  Intra-op Plan:   Post-operative Plan: Extubation in OR  Informed Consent: I have reviewed the patients History and Physical, chart, labs and discussed the procedure including the risks, benefits and alternatives for the proposed anesthesia with the patient or authorized representative who has indicated his/her understanding and acceptance.     Dental advisory given  Plan Discussed with: CRNA  Anesthesia Plan Comments: (COVID-19 Labs  No results for input(s): DDIMER, FERRITIN, LDH, CRP in the last 72 hours.  Lab Results      Component                Value               Date                   [id number]              NEGATIVE            10/15/2018            )        Anesthesia Quick Evaluation

## 2018-10-23 NOTE — Progress Notes (Signed)
Patient ID: Rhonda Bush, female   DOB: Feb 09, 1951, 68 y.o.   MRN: [id number]  [facility]  Patient is evaluated again.  Since her evaluation this morning, she feels like the soft tissue swelling has expanded around the neck.  Her sister is at the bedside and feels like there has been a change in the quality of the patient's voice.  She denies any shortness of breath.  On examination there is no stridor.  Based on this assessment, I feel the safest thing to do is to take her back to the operating room under anesthesia and explore the wound and evacuate the hematoma.  Hopefully we will be able to identify a source of bleeding.  We will likely leave a drain in the neck postoperatively for safety.  I discussed this with the patient and her sister at the bedside.  They understand and agree to proceed urgently this afternoon.  Armandina Gemma, [facility] Office: [phone]

## 2018-10-23 NOTE — Progress Notes (Signed)
Assessment & Plan: HD#2 - POD#5 - neck swelling after parathyroidectomy  CT with relatively diffuse hematoma, possible venous extravasation  Monitored in stepdown unit overnight  Remains clinically stable  Hgb 12.6, coags normal  Begin regular diet  Will reassess at lunchtime today        Rhonda Gemma, MD       [facility]       Office: [phone]   Chief Complaint: Neck swelling, sudden onset, 4 days after parathyroidectomy  Subjective: Patient comfortable, in stepdown unit, nursing at bedside.  Objective: Vital signs in last 24 hours: Temp:  [98.7 F (37.1 C)-98.9 F (37.2 C)] 98.9 F (37.2 C) (08/11 0516) Pulse Rate:  [51-60] 57 (08/11 0507) Resp:  [14-19] 19 (08/11 0507) BP: (133-160)/(56-106) 160/66 (08/11 0507) SpO2:  [96 %-99 %] 99 % (08/11 0507) Weight:  [63.7 kg-64.9 kg] 63.7 kg (08/11 0500) Last BM Date: 10/22/18  Intake/Output from previous day: 08/10 0701 - 08/11 0700 In: 152.1 [I.V.:152.1] Out: -  Intake/Output this shift: No intake/output data recorded.  Physical Exam: HEENT - sclerae clear, mucous membranes moist Neck - moderate soft tissue swelling, voice normal, incision dry and intact; mild tenderness, not tight Chest - clear bilaterally, no stridor Cor - RRR Ext - no edema, non-tender Neuro - alert & oriented, no focal deficits  Lab Results:  Recent Labs    10/22/18 2144 10/23/18 0526  WBC 4.1 4.4  HGB 12.6 12.6  HCT 40.0 40.9  PLT 295 281   BMET Recent Labs    10/22/18 2230  NA 141  K 4.2  CL 108  CO2 24  GLUCOSE 112*  BUN 16  CREATININE 0.68  CALCIUM 8.9   PT/INR Recent Labs    10/23/18 0526  LABPROT 12.4  INR 0.9   Comprehensive Metabolic Panel:    Component Value Date/Time   NA 141 10/22/2018 2230   NA 141 10/15/2018 1109   K 4.2 10/22/2018 2230   K 4.6 10/15/2018 1109   CL 108 10/22/2018 2230   CL 111 10/15/2018 1109   CO2 24 10/22/2018 2230   CO2 25 10/15/2018 1109   BUN 16  10/22/2018 2230   BUN 8 10/15/2018 1109   CREATININE 0.68 10/22/2018 2230   CREATININE 0.57 10/15/2018 1109   GLUCOSE 112 (H) 10/22/2018 2230   GLUCOSE 97 10/15/2018 1109   CALCIUM 8.9 10/22/2018 2230   CALCIUM 11.4 (H) 10/15/2018 1109   AST 23 11/11/2009 0807   AST 28 11/10/2008 0918   ALT 27 11/11/2009 0807   ALT 23 11/10/2008 0918   ALKPHOS 117 11/11/2009 0807   ALKPHOS 85 11/10/2008 0918   BILITOT 0.5 11/11/2009 0807   BILITOT 0.7 11/10/2008 0918   PROT 7.5 11/11/2009 0807   PROT 7.3 11/10/2008 0918   ALBUMIN 3.8 11/11/2009 0807   ALBUMIN 3.7 11/10/2008 0918    Studies/Results: Ct Soft Tissue Neck W Contrast  Addendum Date: 10/23/2018   ADDENDUM REPORT: 10/23/2018 00:11 ADDENDUM: Finding of suspected postoperative hematoma was relayed to Dr. Harlow Asa in the OR via telephone at 0003 hours. Electronically Signed   By: Genevie Ann M.D.   On: 10/23/2018 00:11   Result Date: 10/23/2018 CLINICAL DATA:  68 year old female postoperative day 4 parathyroidectomy. Swelling. EXAM: CT NECK WITH CONTRAST TECHNIQUE: Multidetector CT imaging of the neck was performed using the standard protocol following the bolus administration of intravenous contrast. CONTRAST:  90mL OMNIPAQUE IOHEXOL 300 MG/ML  SOLN COMPARISON:  Nuclear medicine sestamibi  scan 08/24/2018. Ultrasound 08/24/2018. FINDINGS: Pharynx and larynx: Laryngeal and pharyngeal soft tissue contours remain within normal limits. The superior parapharyngeal spaces are normal. There is mild to moderate retropharyngeal space edema, most pronounced at C3 (series 3, image 38). Salivary glands: Negative sublingual space. Submandibular glands appear normal aside from mild anterior mass effect from the strap muscle abnormality described below. Parotid glands are within normal limits. Thyroid: Thyroid parenchyma appears within normal limits. There are multiple small surgical clips along the posterior and lateral aspect of the left lobe. Anterior to the thyroid  there is heterogeneous left greater than right generalized strap muscle enlargement, up to 2 centimeters in thickness, and with areas of heterogeneous hypoenhancement (series 3, image 54 and coronal image 34). This generalized strap muscle thickening continues cephalad to the level of the hyoid bone. And there is associated thickening of the lower platysma, soft tissue inflammation both superficial and deep to the platysma, and also a degree of abnormal thickening of the lower sternocleidomastoid muscles (also greater on the left). Superimposed small volume of soft tissue gas in the left superior parathyroid space on series 3, images 48 and 50 medial to the left carotid space. There is wispy venous enhancement identified within the areas of abnormal muscle thickening, and questionable venous contrast extravasation on series 3, image 49 anterior to the area of soft tissue gas. Lymph nodes: Mild reactive appearing bilateral level 2 lymph nodes. No enlarged or heterogeneous nodes. Vascular: The major vascular structures in the neck and at the skull base remain patent. There is mild lateral mass effect on the left carotid space related to the abnormal thyroid region findings above. There is soft tissue inflammation tracking cephalad along both carotid spaces from the lower neck, greater on the left. Limited intracranial: Negative. Visualized orbits: Negative. Mastoids and visualized paranasal sinuses: Clear. Skeleton: Bilateral TMJ degeneration. Cervical spine degeneration. No acute or suspicious osseous lesion identified. Upper chest: The inflammation surrounding the thyroid bed tracks toward the pre-vascular space of the superior mediastinum, but the remainder of the visible mediastinum is spared. Negative visible proximal great vessels. Negative visible lungs. IMPRESSION: 1. Moderate to severe heterogeneous enlargement of the strap muscles is probably due to postoperative hematoma, in association with generalized  thyroid region soft tissue inflammation. There is less pronounced thickening of the lower sternocleidomastoid muscles, also on the left. 2. Questionable small area of venous contrast extravasation superior to the left thyroid lobe on series 3, image 49. Small volume of postoperative gas in this region. 3. Mild to moderate edema/inflammation in the retropharyngeal space, tracking along the carotid spaces, and also extending retrosternally but involving only the most superior aspect of the anterior mediastinum. Electronically Signed: By: Genevie Ann M.D. On: 10/22/2018 23:57      Rhonda Bush 10/23/2018  Patient ID: Candis Musa, female   DOB: Feb 26, 1951, 68 y.o.   MRN: [id number]

## 2018-10-23 NOTE — Progress Notes (Signed)
General Surgery [facility]  Patient seen and examined again in ER.  Results of CT scan reviewed and shared with patient and her sister.  Clinically appears stable compared to earlier exam.  Moderate soft tissue swelling in neck bilaterally.  Voice is normal.  No stridor.  Moderately tender to palpation.  Will admit to stepdown unit for observation overnight with continuous pulse ox and cardiac monitoring.  Will get labs in AM to include coags.  If patient worsens clinically, will need operative exploration.  Armandina Gemma, [facility] Office: [phone]

## 2018-10-23 NOTE — Transfer of Care (Signed)
Immediate Anesthesia Transfer of Care Note  Patient: Rhonda Bush  Procedure(s) Performed: NECK EXPLORATION, EVACUATION OF HEMATOMA (N/A Neck)  Patient Location: PACU  Anesthesia Type:General  Level of Consciousness: awake, alert  and oriented  Airway & Oxygen Therapy: Patient Spontanous Breathing and Patient connected to face mask oxygen  Post-op Assessment: Report given to RN and Post -op Vital signs reviewed and stable  Post vital signs: Reviewed and stable  Last Vitals:  Vitals Value Taken Time  BP 154/96 10/23/18 1515  Temp    Pulse 79 10/23/18 1515  Resp 18 10/23/18 1515  SpO2 100 % 10/23/18 1515  Vitals shown include unvalidated device data.  Last Pain:  Vitals:   10/23/18 1328  TempSrc: Oral  PainSc:       Patients Stated Pain Goal: 0 (06/25/62 3837)  Complications: No apparent anesthesia complications

## 2018-10-23 NOTE — Anesthesia Procedure Notes (Signed)
Procedure Name: Intubation Date/Time: 10/23/2018 1:47 PM Performed by: Niel Hummer, CRNA Pre-anesthesia Checklist: Emergency Drugs available, Patient identified, Suction available and Patient being monitored Patient Re-evaluated:Patient Re-evaluated prior to induction Oxygen Delivery Method: Circle system utilized Preoxygenation: Pre-oxygenation with 100% oxygen Induction Type: IV induction and Rapid sequence Laryngoscope Size: Glidescope and 3 Grade View: Grade I Tube type: Oral Tube size: 7.0 mm Number of attempts: 1 Airway Equipment and Method: Stylet and Video-laryngoscopy Placement Confirmation: ETT inserted through vocal cords under direct vision,  positive ETCO2 and breath sounds checked- equal and bilateral Secured at: 22 cm Tube secured with: Tape Dental Injury: Teeth and Oropharynx as per pre-operative assessment  Comments: Elective glidescope used given neck swelling. Grade 1 with no deviation. ETT passed easily.

## 2018-10-24 ENCOUNTER — Encounter (HOSPITAL_COMMUNITY): Payer: Self-pay | Admitting: Surgery

## 2018-10-24 DIAGNOSIS — S1083XA Contusion of other specified part of neck, initial encounter: Secondary | ICD-10-CM | POA: Diagnosis present

## 2018-10-24 DIAGNOSIS — I10 Essential (primary) hypertension: Secondary | ICD-10-CM | POA: Diagnosis present

## 2018-10-24 DIAGNOSIS — S1093XA Contusion of unspecified part of neck, initial encounter: Secondary | ICD-10-CM | POA: Diagnosis present

## 2018-10-24 DIAGNOSIS — G43909 Migraine, unspecified, not intractable, without status migrainosus: Secondary | ICD-10-CM | POA: Diagnosis present

## 2018-10-24 DIAGNOSIS — Z79899 Other long term (current) drug therapy: Secondary | ICD-10-CM | POA: Diagnosis not present

## 2018-10-24 DIAGNOSIS — Z20828 Contact with and (suspected) exposure to other viral communicable diseases: Secondary | ICD-10-CM | POA: Diagnosis present

## 2018-10-24 DIAGNOSIS — X58XXXA Exposure to other specified factors, initial encounter: Secondary | ICD-10-CM | POA: Diagnosis present

## 2018-10-24 DIAGNOSIS — Z7982 Long term (current) use of aspirin: Secondary | ICD-10-CM | POA: Diagnosis not present

## 2018-10-24 DIAGNOSIS — Z90710 Acquired absence of both cervix and uterus: Secondary | ICD-10-CM | POA: Diagnosis not present

## 2018-10-24 LAB — CBC
HCT: 38.8 % (ref 36.0–46.0)
Hemoglobin: 11.8 g/dL — ABNORMAL LOW (ref 12.0–15.0)
MCH: 28.7 pg (ref 26.0–34.0)
MCHC: 30.4 g/dL (ref 30.0–36.0)
MCV: 94.4 fL (ref 80.0–100.0)
Platelets: 285 10*3/uL (ref 150–400)
RBC: 4.11 MIL/uL (ref 3.87–5.11)
RDW: 13.8 % (ref 11.5–15.5)
WBC: 5 10*3/uL (ref 4.0–10.5)
nRBC: 0 % (ref 0.0–0.2)

## 2018-10-24 LAB — BASIC METABOLIC PANEL
Anion gap: 9 (ref 5–15)
BUN: 9 mg/dL (ref 8–23)
CO2: 22 mmol/L (ref 22–32)
Calcium: 8.4 mg/dL — ABNORMAL LOW (ref 8.9–10.3)
Chloride: 107 mmol/L (ref 98–111)
Creatinine, Ser: 0.63 mg/dL (ref 0.44–1.00)
GFR calc Af Amer: >60 mL/min (ref >60)
GFR calc non Af Amer: >60 mL/min (ref >60)
Glucose, Bld: 138 mg/dL — ABNORMAL HIGH (ref 70–99)
Potassium: 4.2 mmol/L (ref 3.5–5.1)
Sodium: 138 mmol/L (ref 135–145)

## 2018-10-24 NOTE — Addendum Note (Signed)
Addendum  created 10/24/18 1032 by Barnet Glasgow, MD   Intraprocedure Staff edited

## 2018-10-24 NOTE — Progress Notes (Signed)
Assessment & Plan: POD#1/6 - left parathyroidectomy / evacuation of hematoma with drain placement             Monitored in stepdown unit overnight - no sign of bleeding or airway compromise             Remains clinically stable             Transfer to floor this morning             Begin regular diet             Likely home tomorrow        Rhonda Gemma, MD       [facility]       Office: [phone]   Chief Complaint: Post op bleeding / hematoma after parathyroidectomy  Subjective: Patient in bed, comfortable.  No complaints.  Taking clear liquids.  Objective: Vital signs in last 24 hours: Temp:  [97.9 F (36.6 C)-98.5 F (36.9 C)] 97.9 F (36.6 C) (08/12 0400) Pulse Rate:  [48-94] 83 (08/12 0750) Resp:  [14-21] 16 (08/12 0600) BP: (124-156)/(59-96) 147/71 (08/12 0750) SpO2:  [95 %-100 %] 98 % (08/12 0700) Last BM Date: 10/22/18  Intake/Output from previous day: 08/11 0701 - 08/12 0700 In: 1545.9 [I.V.:1445.9; IV Piggyback:100] Out: 50 [Blood:50] Intake/Output this shift: No intake/output data recorded.  Physical Exam: HEENT - sclerae clear, mucous membranes moist Neck - wound dry and intact; JP drain with <10cc serous drainage; markedly improved soft tissue swelling Chest - clear bilaterally Cor - RRR Ext - no edema, non-tender Neuro - alert & oriented, no focal deficits  Lab Results:  Recent Labs    10/23/18 0526 10/24/18 0158  WBC 4.4 5.0  HGB 12.6 11.8*  HCT 40.9 38.8  PLT 281 285   BMET Recent Labs    10/22/18 2230 10/24/18 0158  NA 141 138  K 4.2 4.2  CL 108 107  CO2 24 22  GLUCOSE 112* 138*  BUN 16 9  CREATININE 0.68 0.63  CALCIUM 8.9 8.4*   PT/INR Recent Labs    10/23/18 0526  LABPROT 12.4  INR 0.9   Comprehensive Metabolic Panel:    Component Value Date/Time   NA 138 10/24/2018 0158   NA 141 10/22/2018 2230   K 4.2 10/24/2018 0158   K 4.2 10/22/2018 2230   CL 107 10/24/2018 0158   CL 108  10/22/2018 2230   CO2 22 10/24/2018 0158   CO2 24 10/22/2018 2230   BUN 9 10/24/2018 0158   BUN 16 10/22/2018 2230   CREATININE 0.63 10/24/2018 0158   CREATININE 0.68 10/22/2018 2230   GLUCOSE 138 (H) 10/24/2018 0158   GLUCOSE 112 (H) 10/22/2018 2230   CALCIUM 8.4 (L) 10/24/2018 0158   CALCIUM 8.9 10/22/2018 2230   AST 23 11/11/2009 0807   AST 28 11/10/2008 0918   ALT 27 11/11/2009 0807   ALT 23 11/10/2008 0918   ALKPHOS 117 11/11/2009 0807   ALKPHOS 85 11/10/2008 0918   BILITOT 0.5 11/11/2009 0807   BILITOT 0.7 11/10/2008 0918   PROT 7.5 11/11/2009 0807   PROT 7.3 11/10/2008 0918   ALBUMIN 3.8 11/11/2009 0807   ALBUMIN 3.7 11/10/2008 0918    Studies/Results: Ct Soft Tissue Neck W Contrast  Addendum Date: 10/23/2018   ADDENDUM REPORT: 10/23/2018 00:11 ADDENDUM: Finding of suspected postoperative hematoma was relayed to Dr. Harlow Asa in the OR via telephone at 0003 hours. Electronically Signed   By: Lemmie Evens  [place] Crane M.D.   On: 10/23/2018 00:11   Result Date: 10/23/2018 CLINICAL DATA:  68 year old female postoperative day 4 parathyroidectomy. Swelling. EXAM: CT NECK WITH CONTRAST TECHNIQUE: Multidetector CT imaging of the neck was performed using the standard protocol following the bolus administration of intravenous contrast. CONTRAST:  76mL OMNIPAQUE IOHEXOL 300 MG/ML  SOLN COMPARISON:  Nuclear medicine sestamibi scan 08/24/2018. Ultrasound 08/24/2018. FINDINGS: Pharynx and larynx: Laryngeal and pharyngeal soft tissue contours remain within normal limits. The superior parapharyngeal spaces are normal. There is mild to moderate retropharyngeal space edema, most pronounced at C3 (series 3, image 38). Salivary glands: Negative sublingual space. Submandibular glands appear normal aside from mild anterior mass effect from the strap muscle abnormality described below. Parotid glands are within normal limits. Thyroid: Thyroid parenchyma appears within normal limits. There are multiple small surgical clips  along the posterior and lateral aspect of the left lobe. Anterior to the thyroid there is heterogeneous left greater than right generalized strap muscle enlargement, up to 2 centimeters in thickness, and with areas of heterogeneous hypoenhancement (series 3, image 54 and coronal image 34). This generalized strap muscle thickening continues cephalad to the level of the hyoid bone. And there is associated thickening of the lower platysma, soft tissue inflammation both superficial and deep to the platysma, and also a degree of abnormal thickening of the lower sternocleidomastoid muscles (also greater on the left). Superimposed small volume of soft tissue gas in the left superior parathyroid space on series 3, images 48 and 50 medial to the left carotid space. There is wispy venous enhancement identified within the areas of abnormal muscle thickening, and questionable venous contrast extravasation on series 3, image 49 anterior to the area of soft tissue gas. Lymph nodes: Mild reactive appearing bilateral level 2 lymph nodes. No enlarged or heterogeneous nodes. Vascular: The major vascular structures in the neck and at the skull base remain patent. There is mild lateral mass effect on the left carotid space related to the abnormal thyroid region findings above. There is soft tissue inflammation tracking cephalad along both carotid spaces from the lower neck, greater on the left. Limited intracranial: Negative. Visualized orbits: Negative. Mastoids and visualized paranasal sinuses: Clear. Skeleton: Bilateral TMJ degeneration. Cervical spine degeneration. No acute or suspicious osseous lesion identified. Upper chest: The inflammation surrounding the thyroid bed tracks toward the pre-vascular space of the superior mediastinum, but the remainder of the visible mediastinum is spared. Negative visible proximal great vessels. Negative visible lungs. IMPRESSION: 1. Moderate to severe heterogeneous enlargement of the strap  muscles is probably due to postoperative hematoma, in association with generalized thyroid region soft tissue inflammation. There is less pronounced thickening of the lower sternocleidomastoid muscles, also on the left. 2. Questionable small area of venous contrast extravasation superior to the left thyroid lobe on series 3, image 49. Small volume of postoperative gas in this region. 3. Mild to moderate edema/inflammation in the retropharyngeal space, tracking along the carotid spaces, and also extending retrosternally but involving only the most superior aspect of the anterior mediastinum. Electronically Signed: By: Genevie Ann M.D. On: 10/22/2018 23:57      Rhonda Bush 10/24/2018  Patient ID: Candis Musa, female   DOB: Feb 11, 1951, 68 y.o.   MRN: [id number]

## 2018-10-25 NOTE — Discharge Summary (Signed)
Physician Discharge Summary [facility]  Patient ID: Rhonda Bush MRN: [id number] DOB/AGE: 1951-02-18 68 y.o.  Admit date: 10/22/2018 Discharge date: 10/25/2018  Admission Diagnoses:  Post op neck hematoma  Discharge Diagnoses:  Principal Problem:   Neck swelling Active Problems:   Hematoma of neck   Discharged Condition: good  Hospital Course: patient admitted from ER for development of neck hematoma following parathyroidectomy (POD#4).  Observed in stepdown unit.  Progressive swelling and compressive symptoms.  Returned to OR for wound exploration, evacuation of hematoma, and control of bleeding.  Stable post op.  Prepared for discharge on POD#2 after drain removal.  Consults: None  Treatments: surgery: neck exploration and control of bleeding, drainage of neck  Discharge Exam: Blood pressure 129/81, pulse 67, temperature 98.5 F (36.9 C), temperature source Oral, resp. rate 18, height 4\' 11"  (1.499 m), weight 63.7 kg, SpO2 98 %. HEENT - clear Neck - wound dry and intact; JP with small serous output - removed Chest - clear bilaterally Cor - RRR  Disposition: Home  Discharge Instructions    Diet - low sodium heart healthy   Complete by: As directed    Discharge instructions   Complete by: As directed    [facility]  THYROID & PARATHYROID SURGERY:  POST-OP INSTRUCTIONS  Always review your discharge instruction sheet from the facility where your surgery was performed.  A prescription for pain medication may be given to you upon discharge.  Take your pain medication as prescribed.  If narcotic pain medicine is not needed, then you may take acetaminophen (Tylenol) or ibuprofen (Advil) as needed.  Take your usually prescribed medications unless otherwise directed.  If you need a refill on your pain medication, please contact our office during regular business hours.  Prescriptions cannot be processed by our office after 5 pm  or on weekends.  Start with a light diet upon arrival home, such as soup and crackers or toast.  Be sure to drink plenty of fluids daily.  Resume your normal diet the day after surgery.  Most patients will experience some swelling and bruising on the chest and neck area.  Ice packs will help.  Swelling and bruising can take several days to resolve.   It is common to experience some constipation after surgery.  Increasing fluid intake and taking a stool softener (Colace) will usually help or prevent this problem.  A mild laxative (Milk of Magnesia or Miralax) should be taken according to package directions if there has been no bowel movement after 48 hours.  You have steri-strips and a gauze dressing over your incision.  You may remove the gauze bandage on the second day after surgery, and you may shower at that time.  Leave your steri-strips (small skin tapes) in place directly over the incision.  These strips should remain on the skin for 5-7 days and then be removed.  You may get them wet in the shower and pat them dry.  You may resume regular (light) daily activities beginning the next day (such as daily self-care, walking, climbing stairs) gradually increasing activities as tolerated.  You may have sexual intercourse when it is comfortable.  Refrain from any heavy lifting or straining until approved by your doctor.  You may drive when you no longer are taking prescription pain medication, you can comfortably wear a seatbelt, and you can safely maneuver your car and apply brakes.  You should see your doctor in the office for a  follow-up appointment approximately three weeks after your surgery.  Make sure that you call for this appointment within a day or two after you arrive home to insure a convenient appointment time.  WHEN TO CALL YOUR DOCTOR: -- Fever greater than 101.5 -- Inability to urinate -- Nausea and/or vomiting - persistent -- Extreme swelling or bruising -- Continued bleeding from  incision -- Increased pain, redness, or drainage from the incision -- Difficulty swallowing or breathing -- Muscle cramping or spasms -- Numbness or tingling in hands or around lips  The clinic staff is available to answer your questions during regular business hours.  Please don't hesitate to call and ask to speak to one of the nurses if you have concerns.  Armandina Gemma, MD [facility] Office: [phone]   Increase activity slowly   Complete by: As directed    Remove dressing in 24 hours   Complete by: As directed      Allergies as of 10/25/2018   No Known Allergies     Medication List    TAKE these medications   amLODipine 5 MG tablet Commonly known as: NORVASC Take 5 mg by mouth daily after breakfast.   aspirin 81 MG tablet Take 81 mg by mouth daily after breakfast.   cholecalciferol 25 MCG (1000 UT) tablet Commonly known as: VITAMIN D3 Take 1,000 Units by mouth daily after breakfast.   dorzolamide 2 % ophthalmic solution Commonly known as: TRUSOPT Place 1 drop into both eyes 2 (two) times daily.   FLUoxetine HCl 60 MG Tabs Take 60 mg by mouth daily after breakfast.   folic acid 1 MG tablet Commonly known as: FOLVITE Take 1 mg by mouth daily after breakfast.   latanoprost 0.005 % ophthalmic solution Commonly known as: XALATAN Place 1 drop into both eyes at bedtime.   methotrexate 2.5 MG tablet Commonly known as: RHEUMATREX Take 12.5 mg by mouth every Monday.   metoprolol succinate 100 MG 24 hr tablet Commonly known as: TOPROL-XL Take 100 mg by mouth daily after breakfast. Take with or immediately following a meal.   SUMAtriptan 100 MG tablet Commonly known as: IMITREX Take 100 mg by mouth every 2 (two) hours as needed for migraine. May repeat in 2 hours if headache persists or recurs.   telmisartan 80 MG tablet Commonly known as: MICARDIS Take 80 mg by mouth daily after breakfast.      Follow-up Information    Armandina Gemma,  MD. Schedule an appointment as soon as possible for a visit in 2 week(s).   Specialty: General Surgery Contact information: [address] [place] [address] [phone]           Earnstine Regal, MD, [address] [facility] Office: [phone]   Signed: Armandina Gemma 10/25/2018, 10:30 AM

## 2018-10-25 NOTE — Plan of Care (Signed)
Assessment unchanged. Pt verbalized understanding of dc instructions through teach back. Dressing changed by Dr Harlow Asa prior to leaving and drain removed. Discharged via wc accompanied be nt to front entrance to meet ride.

## 2018-10-29 DIAGNOSIS — Z79899 Other long term (current) drug therapy: Secondary | ICD-10-CM | POA: Diagnosis not present

## 2018-10-29 DIAGNOSIS — M810 Age-related osteoporosis without current pathological fracture: Secondary | ICD-10-CM | POA: Diagnosis not present

## 2018-10-29 DIAGNOSIS — M0579 Rheumatoid arthritis with rheumatoid factor of multiple sites without organ or systems involvement: Secondary | ICD-10-CM | POA: Diagnosis not present

## 2018-10-29 DIAGNOSIS — D72819 Decreased white blood cell count, unspecified: Secondary | ICD-10-CM | POA: Diagnosis not present

## 2018-11-05 DIAGNOSIS — E210 Primary hyperparathyroidism: Secondary | ICD-10-CM | POA: Diagnosis not present

## 2018-11-26 ENCOUNTER — Other Ambulatory Visit: Payer: Self-pay | Admitting: Family Medicine

## 2018-11-26 DIAGNOSIS — Z1231 Encounter for screening mammogram for malignant neoplasm of breast: Secondary | ICD-10-CM

## 2018-12-03 DIAGNOSIS — E210 Primary hyperparathyroidism: Secondary | ICD-10-CM | POA: Diagnosis not present

## 2018-12-03 DIAGNOSIS — E559 Vitamin D deficiency, unspecified: Secondary | ICD-10-CM | POA: Diagnosis not present

## 2018-12-03 DIAGNOSIS — Z79899 Other long term (current) drug therapy: Secondary | ICD-10-CM | POA: Diagnosis not present

## 2018-12-10 DIAGNOSIS — I10 Essential (primary) hypertension: Secondary | ICD-10-CM | POA: Diagnosis not present

## 2018-12-10 DIAGNOSIS — E210 Primary hyperparathyroidism: Secondary | ICD-10-CM | POA: Diagnosis not present

## 2018-12-10 DIAGNOSIS — E559 Vitamin D deficiency, unspecified: Secondary | ICD-10-CM | POA: Diagnosis not present

## 2018-12-10 DIAGNOSIS — M810 Age-related osteoporosis without current pathological fracture: Secondary | ICD-10-CM | POA: Diagnosis not present

## 2018-12-18 DIAGNOSIS — I10 Essential (primary) hypertension: Secondary | ICD-10-CM | POA: Diagnosis not present

## 2018-12-18 DIAGNOSIS — R600 Localized edema: Secondary | ICD-10-CM | POA: Diagnosis not present

## 2018-12-19 ENCOUNTER — Ambulatory Visit (INDEPENDENT_AMBULATORY_CARE_PROVIDER_SITE_OTHER): Payer: BC Managed Care – PPO | Admitting: Allergy

## 2018-12-19 ENCOUNTER — Encounter: Payer: Self-pay | Admitting: Allergy

## 2018-12-19 ENCOUNTER — Other Ambulatory Visit: Payer: Self-pay

## 2018-12-19 VITALS — BP 138/84 | HR 58 | Temp 97.9°F | Resp 16 | Ht 58.5 in | Wt 150.4 lb

## 2018-12-19 DIAGNOSIS — J4530 Mild persistent asthma, uncomplicated: Secondary | ICD-10-CM

## 2018-12-19 DIAGNOSIS — J45909 Unspecified asthma, uncomplicated: Secondary | ICD-10-CM | POA: Insufficient documentation

## 2018-12-19 DIAGNOSIS — J310 Chronic rhinitis: Secondary | ICD-10-CM | POA: Diagnosis not present

## 2018-12-19 MED ORDER — ALBUTEROL SULFATE HFA 108 (90 BASE) MCG/ACT IN AERS: INHALATION_SPRAY | RESPIRATORY_TRACT | 1 refills | Status: DC

## 2018-12-19 MED ORDER — FLOVENT HFA 110 MCG/ACT IN AERO: 2.0000 | INHALATION_SPRAY | Freq: Two times a day (BID) | RESPIRATORY_TRACT | 3 refills | Status: DC

## 2018-12-19 NOTE — Patient Instructions (Addendum)
Today's skin testing was negative to indoor, outdoor environmental allergies and common basic foods.   Your breathing test did get slightly better after completing breathing treatment.  . Daily controller medication(s):  Start Flovent 110 2 puffs twice a day with spacer rinse mouth afterwards.  Spacer given and demonstrated proper use. . Prior to physical activity: May use albuterol rescue inhaler 2 puffs 5 to 15 minutes prior to strenuous physical activities. Marland Kitchen Rescue medications: May use albuterol rescue inhaler 2 puffs or nebulizer every 4 to 6 hours as needed for shortness of breath, chest tightness, coughing, and wheezing. Monitor frequency of use.  . Asthma control goals:  o Full participation in all desired activities (may need albuterol before activity) o Albuterol use two times or less a week on average (not counting use with activity) o Cough interfering with sleep two times or less a month o Oral steroids no more than once a year o No hospitalizations  Follow up in 4 weeks.

## 2018-12-19 NOTE — Progress Notes (Signed)
New Patient Note  RE: Rhonda Bush MRN: [id number] DOB: Jan 28, 1951 Date of Office Visit: 12/19/2018  Referring provider: Donald Prose, MD Primary care provider: Donald Prose, MD  Chief Complaint: Asthma  History of Present Illness: I had the pleasure of seeing Rhonda Bush for initial evaluation at the [facility] on 12/20/2018. She is a 68 y.o. female, who is self-referred here for the evaluation of asthma. Patient used to follow at our office with Dr. Ishmael Holter for asthma.   Asthma: ACT score 17.  She reports symptoms of chest tightness, shortness of breath, wheezing, nocturnal awakenings for 10 years but worse the past 2 months. Current medications include none. She reports not using aerochamber with inhalers. She tried the following inhalers: unsure. Main triggers are unknown. In the last month, frequency of symptoms: throughout the day. Frequency of nocturnal symptoms: sometimes. Frequency of SABA use: 0x/week. Interference with physical activity: sometimes. Sleep is disturbed. In the last 12 months, emergency room visits/urgent care visits/doctor office visits or hospitalizations due to respiratory issues: 0. In the last 12 months, oral steroids courses: 0. Lifetime history of hospitalization for respiratory issues: 0. Prior intubations: no.  History of pneumonia: no. She was evaluated by allergist in the past. Smoking exposure: denies. Up to date with flu vaccine: no. Up to date with pneumonia vaccine: yes.  History of reflux: yes but not on any daily medications. No fevers or chills. No sick contacts.  Assessment and Plan: Rhonda Bush is a 68 y.o. female with: Reactive airway disease Patient was apparently diagnosed with asthma over 10 years ago but worse in the last 2 months.  She used to follow in our office many years ago. Patient has not been using any type of inhalers but having daily symptoms.  Today's skin testing was negative to indoor, outdoor environmental  allergies and common basic foods.   ACT score 17.  Today's spirometry shows some restriction with minimal improvement in FEV1 post bronchodilator treatment.  Patient felt clinically better. . Daily controller medication(s):  Start Flovent 110 2 puffs twice a day with spacer rinse mouth afterwards.  Spacer given and demonstrated proper use. . Prior to physical activity: May use albuterol rescue inhaler 2 puffs 5 to 15 minutes prior to strenuous physical activities. Marland Kitchen Rescue medications: May use albuterol rescue inhaler 2 puffs or nebulizer every 4 to 6 hours as needed for shortness of breath, chest tightness, coughing, and wheezing. Monitor frequency of use.  . Repeat spirometry at next visit.  Chronic rhinitis Minimal rhinoconjunctivitis symptoms in the spring.  Skin testing in the past was positive to trees per patient report.  Today's skin testing was negative to indoor, outdoor environmental allergies.  Monitor symptoms for now.    Return in about 4 weeks (around 01/16/2019).  Meds ordered this encounter  Medications  . fluticasone (FLOVENT HFA) 110 MCG/ACT inhaler    Sig: Inhale 2 puffs into the lungs 2 (two) times daily.    Dispense:  1 Inhaler    Refill:  3  . albuterol (VENTOLIN HFA) 108 (90 Base) MCG/ACT inhaler    Sig: 2 puffs every 4-6 hours PRN for shortness of breath, chest tightness, coughing, wheezing.    Dispense:  18 g    Refill:  1   Other allergy screening: Asthma: yes Rhino conjunctivitis: yes Minimal symptoms during the spring. Skin testing in the past was positive to trees per patient report.  Food allergy: no Medication allergy: no Hymenoptera allergy: no Urticaria:  no Eczema:no History of recurrent infections suggestive of immunodeficency: no  Diagnostics: Spirometry:  Tracings reviewed. Her effort: Good reproducible efforts. FVC: 1.25L FEV1: 1.07L, 69% predicted FEV1/FVC ratio: 86% Interpretation: Spirometry consistent with possible restrictive  disease and minimal improvement in FEV1 post bronchodilator treatment.  Currently feeling better. Please see scanned spirometry results for details.  Skin Testing: Environmental allergy panel and select foods. Negative test to: Environmental allergy panel and basic common foods..  Results discussed with patient/family. Airborne Adult Perc - 12/19/18 1407    Time Antigen Placed  1407    Allergen Manufacturer  Lavella Hammock    Location  Back    Number of Test  59    Panel 1  Select    1. Control-Buffer 50% Glycerol  Negative    2. Control-Histamine 1 mg/ml  2+    3. Albumin saline  Negative    4. Auburn  Negative    5. Guatemala  Negative    6. Johnson  Negative    7. Manning Blue  Negative    8. Meadow Fescue  Negative    9. Perennial Rye  Negative    10. Sweet Vernal  Negative    11. Timothy  Negative    12. Cocklebur  Negative    13. Burweed Marshelder  Negative    14. Ragweed, short  Negative    15. Ragweed, Giant  Negative    16. Plantain,  English  Negative    17. Lamb's Quarters  Negative    18. Sheep Sorrell  Negative    19. Rough Pigweed  Negative    20. Marsh Elder, Rough  Negative    21. Mugwort, Common  Negative    22. Ash mix  Negative    23. Birch mix  Negative    24. Beech American  Negative    25. Box, Elder  Negative    26. Cedar, red  Negative    27. Cottonwood, [place]  Negative    28. Elm mix  Negative    29. Hickory mix  Negative    30. Maple mix  Negative    31. Oak, [place] mix  Negative    32. Pecan Pollen  Negative    33. Pine mix  Negative    34. Sycamore Eastern  Negative    35. Huntington, Black Pollen  Negative    36. Alternaria alternata  Negative    37. Cladosporium Herbarum  Negative    38. Aspergillus mix  Negative    39. Penicillium mix  Negative    40. Bipolaris sorokiniana (Helminthosporium)  Negative    41. Drechslera spicifera (Curvularia)  Negative    42. Mucor plumbeus  Negative    43. Fusarium moniliforme  Negative    44. Aureobasidium  pullulans (pullulara)  Negative    45. Rhizopus oryzae  Negative    46. Botrytis cinera  Negative    47. Epicoccum nigrum  Negative    48. Phoma betae  Negative    49. Candida Albicans  Negative    50. Trichophyton mentagrophytes  Negative    51. Mite, D Farinae  5,000 AU/ml  Negative    52. Mite, D Pteronyssinus  5,000 AU/ml  Negative    53. Cat Hair 10,000 BAU/ml  Negative    54.  Dog Epithelia  Negative    55. Mixed Feathers  Negative    56. Horse Epithelia  Negative    57. Cockroach, German  Negative    58. Mouse  Negative    59. Tobacco Leaf  Negative     Food Perc - 12/19/18 1408    Time Antigen Placed  1408    Allergen Manufacturer  Lavella Hammock    Location  Back    Number of allergen test  10    Food  Select    1. Peanut  Negative    2. Soybean food  Negative    3. Wheat, whole  Negative    4. Sesame  Negative    5. Milk, cow  Negative    6. Egg White, chicken  Negative    7. Casein  Negative    8. Shellfish mix  Negative    9. Fish mix  Negative    10. Cashew  Negative       Past Medical History: Patient Active Problem List   Diagnosis Date Noted  . Reactive airway disease 12/19/2018  . Chronic rhinitis 12/19/2018  . Hematoma of neck 10/23/2018  . Neck swelling 10/22/2018  . Hyperparathyroidism, primary ([place]) 10/13/2018  . 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 BURSITIS, LEFT SHOULDER 12/18/2006  . RESTLESS LEG SYNDROME, SEVERE 10/16/2006  . OSTEOPOROSIS NOS 10/16/2006  . MIGRAINE HEADACHE 10/14/2006  . SYNDROME, PREMENSTRUAL TENSION 10/14/2006  . DEPRESSION 09/25/2006  . GLAUCOMA NOS 09/25/2006  . HYPERTENSION 09/25/2006   Past Medical History:  Diagnosis Date  . Arthritis    rheumatoid arthritis -mild(feet,ankles,hand)-not bothersome now  . Asthma    mild- not routine use of meds or inhalers  . BURSITIS, LEFT SHOULDER 12/18/2006  . 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 DEPRESSION 09/25/2006  . GLAUCOMA NOS  09/25/2006  . History of hiatal hernia   . HYPERTENSION 09/25/2006  . MIGRAINE HEADACHE 10/14/2006  . OSTEOPOROSIS NOS 10/16/2006  . PELVIC REGION, PAIN 12/17/2009  . RESTLESS LEG SYNDROME, SEVERE 10/16/2006   not bothered now  . SYNDROME, PREMENSTRUAL TENSION 10/14/2006   Past Surgical History: Past Surgical History:  Procedure Laterality Date  . ABDOMINAL HYSTERECTOMY  1992 ,  [place] in [place]   [place] b[place]-fib[place]   . BREAST BIOPSY Right   . ESOPHAGOGASTRODUODENOSCOPY (EGD) WITH PROPOFOL N/A 02/13/2013   Procedure: ESOPHAGOGASTRODUODENOSCOPY (EGD) WITH PROPOFOL;  Surgeon: Arta Silence, MD;  Location: [facility];  Service: Endoscopy;  Laterality: N/A;  . EUS N/A 02/13/2013   Procedure: ESOPHAGEAL ENDOSCOPIC ULTRASOUND (EUS) RADIAL;  Surgeon: Arta Silence, MD;  Location: [facility];  Service: Endoscopy;  Laterality: N/A;  . PARATHYROIDECTOMY N/A 10/18/2018   Procedure: PARATHYROIDECTOMY;  Surgeon: Armandina Gemma, MD;  Location: [facility];  Service: General;  Laterality: N/A;  . PARATHYROIDECTOMY N/A 10/23/2018   Procedure: NECK EXPLORATION, EVACUATION OF HEMATOMA;  Surgeon: Armandina Gemma, MD;  Location: [facility];  Service: General;  Laterality: N/A;   Medication List:  Current Outpatient Medications  Medication Sig Dispense Refill  . amLODipine (NORVASC) 5 MG tablet Take 5 mg by mouth daily after breakfast.     . aspirin 81 MG tablet Take 81 mg by mouth daily after breakfast.     . cholecalciferol (VITAMIN D3) 25 MCG (1000 UT) tablet Take 1,000 Units by mouth daily after breakfast.     . dorzolamide (TRUSOPT) 2 % ophthalmic solution Place 1 drop into both eyes 2 (two) times daily.    Marland Kitchen FLUoxetine HCl 60 MG TABS Take 60 mg by mouth daily after breakfast.     . folic acid (FOLVITE) 1 MG tablet Take 1 mg by mouth daily after breakfast.     .  latanoprost (XALATAN) 0.005 % ophthalmic solution Place 1 drop into both eyes at bedtime.    . methotrexate (RHEUMATREX) 2.5 MG tablet     . metoprolol  succinate (TOPROL-XL) 100 MG 24 hr tablet Take 100 mg by mouth daily after breakfast. Take with or immediately following a meal.     . telmisartan (MICARDIS) 80 MG tablet Take 80 mg by mouth daily after breakfast.     . albuterol (VENTOLIN HFA) 108 (90 Base) MCG/ACT inhaler 2 puffs every 4-6 hours PRN for shortness of breath, chest tightness, coughing, wheezing. 18 g 1  . fluticasone (FLOVENT HFA) 110 MCG/ACT inhaler Inhale 2 puffs into the lungs 2 (two) times daily. 1 Inhaler 3  . hydrochlorothiazide (HYDRODIURIL) 25 MG tablet     . methotrexate (RHEUMATREX) 2.5 MG tablet Take 12.5 mg by mouth every Monday.    . SUMAtriptan (IMITREX) 100 MG tablet Take 100 mg by mouth every 2 (two) hours as needed for migraine. May repeat in 2 hours if headache persists or recurs.     No current facility-administered medications for this visit.    Allergies: No Known Allergies Social History: Social History   Socioeconomic History  . Marital status: Divorced    Spouse name: Not on file  . Number of children: Not on file  . Years of education: Not on file  . Highest education level: Not on file  Occupational History  . Not on file  Social Needs  . Financial resource strain: Not on file  . Food insecurity    Worry: Not on file    Inability: Not on file  . Transportation needs    Medical: Not on file    Non-medical: Not on file  Tobacco Use  . Smoking status: Never Smoker  . Smokeless tobacco: Never Used  Substance and Sexual Activity  . Alcohol use: Yes    Comment: very rare  . Drug use: No  . Sexual activity: Not Currently  Lifestyle  . Physical activity    Days per week: Not on file    Minutes per session: Not on file  . Stress: Not on file  Relationships  . Social Herbalist on phone: Not on file    Gets together: Not on file    Attends religious service: Not on file    Active member of club or organization: Not on file    Attends meetings of clubs or organizations: Not  on file    Relationship status: Not on file  Other Topics Concern  . Not on file  Social History Narrative  . Not on file   Lives in a 66 year old home. 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 No pertinent family history. Problem                               Relation Asthma                                   No  Eczema  No  Food allergy                          No  Allergic rhino conjunctivitis     No   Review of Systems  Constitutional: Negative for appetite change, chills, fever and unexpected weight change.  HENT: Negative for congestion and rhinorrhea.   Eyes: Negative for itching.  Respiratory: Positive for cough, chest tightness, shortness of breath and wheezing.   Cardiovascular: Negative for chest pain.  Gastrointestinal: Negative for abdominal pain.  Genitourinary: Negative for difficulty urinating.  Skin: Negative for rash.  Allergic/Immunologic: Negative for environmental allergies and food allergies.  Neurological: Negative for headaches.   Objective: BP 138/84 (BP Location: Left Arm, Patient Position: Sitting, Cuff Size: Normal)   Pulse (!) 58   Temp 97.9 F (36.6 C) (Temporal)   Resp 16   Ht 4' 10.5" (1.486 m)   Wt 150 lb 6.4 oz (68.2 kg)   SpO2 98%   BMI 30.90 kg/m  Body mass index is 30.9 kg/m. Physical Exam  Constitutional: She is oriented to person, place, and time. She appears well-developed and well-nourished.  HENT:  Head: Normocephalic and atraumatic.  Right Ear: External ear normal.  Left Ear: External ear normal.  Nose: Nose normal.  Mouth/Throat: Oropharynx is clear and moist.  Eyes: Conjunctivae and EOM are normal.  Neck: Neck supple.  Cardiovascular: Normal rate, regular rhythm and normal heart sounds.  Exam reveals no gallop and no friction rub.  No murmur heard. Pulmonary/Chest: Effort normal and breath sounds normal. She has no wheezes. She has no rales.  Abdominal: Soft.  Neurological: She is alert and oriented to person, place, and time.  Skin: Skin is warm. No rash noted.  Psychiatric: She has a normal mood and affect. Her behavior is normal.  Nursing note and vitals reviewed.  The plan was reviewed with the patient/family, and all questions/concerned were addressed.  It was my pleasure to see Rhonda Bush today and participate in her care. Please feel free to contact me with any questions or concerns.  Sincerely,  Rexene Alberts, DO Allergy & Immunology  [facility] office: [phone] [facility] office: [place] office: [phone]

## 2018-12-20 ENCOUNTER — Encounter: Payer: Self-pay | Admitting: Allergy

## 2018-12-20 NOTE — Assessment & Plan Note (Addendum)
Minimal rhinoconjunctivitis symptoms in the spring.  Skin testing in the past was positive to trees per patient report.  Today's skin testing was negative to indoor, outdoor environmental allergies.  Monitor symptoms for now.

## 2018-12-20 NOTE — Assessment & Plan Note (Addendum)
Patient was apparently diagnosed with asthma over 10 years ago but worse in the last 2 months.  She used to follow in our office many years ago. Patient has not been using any type of inhalers but having daily symptoms.  Today's skin testing was negative to indoor, outdoor environmental allergies and common basic foods.   ACT score 17.  Today's spirometry shows some restriction with minimal improvement in FEV1 post bronchodilator treatment.  Patient felt clinically better. . Daily controller medication(s):  Start Flovent 110 2 puffs twice a day with spacer rinse mouth afterwards.  Spacer given and demonstrated proper use. . Prior to physical activity: May use albuterol rescue inhaler 2 puffs 5 to 15 minutes prior to strenuous physical activities. Marland Kitchen Rescue medications: May use albuterol rescue inhaler 2 puffs or nebulizer every 4 to 6 hours as needed for shortness of breath, chest tightness, coughing, and wheezing. Monitor frequency of use.  . Repeat spirometry at next visit.

## 2019-01-07 ENCOUNTER — Other Ambulatory Visit: Payer: Self-pay

## 2019-01-07 ENCOUNTER — Ambulatory Visit
Admission: RE | Admit: 2019-01-07 | Discharge: 2019-01-07 | Disposition: A | Discharge status: Home / Self Care | Payer: BC Managed Care – PPO | Source: Ambulatory Visit | Attending: Family Medicine | Admitting: Family Medicine

## 2019-01-07 DIAGNOSIS — Z1231 Encounter for screening mammogram for malignant neoplasm of breast: Secondary | ICD-10-CM

## 2019-01-16 ENCOUNTER — Other Ambulatory Visit: Payer: Self-pay

## 2019-01-16 ENCOUNTER — Encounter: Payer: Self-pay | Admitting: Allergy

## 2019-01-16 ENCOUNTER — Ambulatory Visit (INDEPENDENT_AMBULATORY_CARE_PROVIDER_SITE_OTHER): Payer: BC Managed Care – PPO | Admitting: Allergy

## 2019-01-16 VITALS — BP 110/64 | HR 58 | Temp 98.2°F | Resp 17

## 2019-01-16 DIAGNOSIS — J310 Chronic rhinitis: Secondary | ICD-10-CM

## 2019-01-16 DIAGNOSIS — J4530 Mild persistent asthma, uncomplicated: Secondary | ICD-10-CM

## 2019-01-16 NOTE — Assessment & Plan Note (Signed)
Past history - Patient was apparently diagnosed with asthma over 10 years ago but worse in the last 2 months.  She used to follow in our office many years ago. Patient has not been using any type of inhalers but having daily symptoms. 2020 spirometry shows some restriction with minimal improvement in FEV1 post bronchodilator treatment.  Patient felt clinically better. Interim history - Doing much better with daily Flovent and used albuterol once a week at most.   Today's ACT score is 22 (improved from 17).  Today's spirometry was normal.   Daily controller medication(s): continue Flovent 110 2 puffs twice a day with spacer rinse mouth afterwards.    Prior to physical activity:May use albuterol rescue inhaler 2 puffs 5 to 15 minutes prior to strenuous physical activities.  Rescue medications:May use albuterol rescue inhaler 2 puffs or nebulizer every 4 to 6 hours as needed for shortness of breath, chest tightness, coughing, and wheezing. Monitor frequency of use.

## 2019-01-16 NOTE — Assessment & Plan Note (Signed)
Past history - Minimal rhinoconjunctivitis symptoms in the spring.  Skin testing in the past was positive to trees per patient report. 2020 skin testing was negative to indoor, outdoor environmental allergies. Interim history - asymptomatic with no medications.   Monitor symptoms for now.

## 2019-01-16 NOTE — Patient Instructions (Addendum)
Asthma:   Today's spirometry was normal.   Daily controller medication(s): continue Flovent 110 2 puffs twice a day with spacer rinse mouth afterwards.    Prior to physical activity:May use albuterol rescue inhaler 2 puffs 5 to 15 minutes prior to strenuous physical activities.  Rescue medications:May use albuterol rescue inhaler 2 puffs or nebulizer every 4 to 6 hours as needed for shortness of breath, chest tightness, coughing, and wheezing. Monitor frequency of use.  Asthma control goals:  Full participation in all desired activities (may need albuterol before activity) Albuterol use two times or less a week on average (not counting use with activity) Cough interfering with sleep two times or less a month Oral steroids no more than once a year No hospitalizations  Chronic rhinitis 2020 skin testing was negative to indoor, outdoor environmental allergies.  Monitor symptoms for now.   Follow up in 4 months or sooner if needed.

## 2019-01-16 NOTE — Progress Notes (Signed)
Follow Up Note  RE: Rhonda [facility] MRN: [id number] DOB: 1950/08/21 Date of Office Visit: 01/16/2019  Referring provider: Donald Prose, MD Primary care provider: Donald Prose, MD  Chief Complaint: Asthma  History of Present Illness: I had the pleasure of seeing Rhonda [facility] for a follow up visit at the [facility] on 01/16/2019. She is a 68 y.o. female, who is being followed for reactive airway disease and chronic rhinitis. Today she is here for regular follow up visit. Her previous allergy office visit was on 12/19/2018 with Dr. Maudie Mercury.   Asthma:  ACT score: 22 Currently on Flovent 110 2 puffs twice a day with spacer and noticed improvement in her breathing.  Denies any coughing, wheezing, chest tightness, nocturnal awakenings, ER/urgent care visits or prednisone use since the last visit. Used albuterol about once a week for sob mainly with exertion but sometimes at rest. Albuterol seemed to help.   Chronic rhinitis No symptoms and doing well with no medications.    Assessment and Plan: Rhonda Bush is a 69 y.o. female with: Mild persistent asthma without complication Past history - Patient was apparently diagnosed with asthma over 10 years ago but worse in the last 2 months.  She used to follow in our office many years ago. Patient has not been using any type of inhalers but having daily symptoms. 2020 spirometry shows some restriction with minimal improvement in FEV1 post bronchodilator treatment.  Patient felt clinically better. Interim history - Doing much better with daily Flovent and used albuterol once a week at most.   Today's ACT score is 22 (improved from 17).  Today's spirometry was normal.   Daily controller medication(s): continue Flovent 110 2 puffs twice a day with spacer rinse mouth afterwards.    Prior to physical activity:May use albuterol rescue inhaler 2 puffs 5 to 15 minutes prior to strenuous physical activities.  Rescue medications:May use  albuterol rescue inhaler 2 puffs or nebulizer every 4 to 6 hours as needed for shortness of breath, chest tightness, coughing, and wheezing. Monitor frequency of use.  Chronic rhinitis Past history - Minimal rhinoconjunctivitis symptoms in the spring.  Skin testing in the past was positive to trees per patient report. 2020 skin testing was negative to indoor, outdoor environmental allergies. Interim history - asymptomatic with no medications.   Monitor symptoms for now.    Return in about 4 months (around 05/16/2019).  Diagnostics: Spirometry:  Tracings reviewed. Her effort: Good reproducible efforts. FVC: 1.59L FEV1: 1.33L, 96% predicted FEV1/FVC ratio: 84% Interpretation: Spirometry consistent with normal pattern.  Please see scanned spirometry results for details.  Medication List:  Current Outpatient Medications  Medication Sig Dispense Refill  . albuterol (VENTOLIN HFA) 108 (90 Base) MCG/ACT inhaler 2 puffs every 4-6 hours PRN for shortness of breath, chest tightness, coughing, wheezing. 18 g 1  . amLODipine (NORVASC) 5 MG tablet Take 5 mg by mouth daily after breakfast.     . aspirin 81 MG tablet Take 81 mg by mouth daily after breakfast.     . cholecalciferol (VITAMIN D3) 25 MCG (1000 UT) tablet Take 1,000 Units by mouth daily after breakfast.     . dorzolamide (TRUSOPT) 2 % ophthalmic solution Place 1 drop into both eyes 2 (two) times daily.    Marland Kitchen FLUoxetine HCl 60 MG TABS Take 60 mg by mouth daily after breakfast.     . fluticasone (FLOVENT HFA) 110 MCG/ACT inhaler Inhale 2 puffs into the lungs 2 (two) times daily.  1 Inhaler 3  . folic acid (FOLVITE) 1 MG tablet Take 1 mg by mouth daily after breakfast.     . hydrochlorothiazide (HYDRODIURIL) 25 MG tablet     . latanoprost (XALATAN) 0.005 % ophthalmic solution Place 1 drop into both eyes at bedtime.    . methotrexate (RHEUMATREX) 2.5 MG tablet Take 12.5 mg by mouth every Monday.    . methotrexate (RHEUMATREX) 2.5 MG tablet      . metoprolol succinate (TOPROL-XL) 100 MG 24 hr tablet Take 100 mg by mouth daily after breakfast. Take with or immediately following a meal.     . SUMAtriptan (IMITREX) 100 MG tablet Take 100 mg by mouth every 2 (two) hours as needed for migraine. May repeat in 2 hours if headache persists or recurs.    Marland Kitchen telmisartan (MICARDIS) 80 MG tablet Take 80 mg by mouth daily after breakfast.     . FLUoxetine (PROZAC) 20 MG tablet Take 60 mg by mouth daily.     No current facility-administered medications for this visit.    Allergies: No Known Allergies I reviewed her past medical history, social history, family history, and environmental history and no significant changes have been reported from her previous visit.  Review of Systems  Constitutional: Negative for appetite change, chills, fever and unexpected weight change.  HENT: Negative for congestion and rhinorrhea.   Eyes: Negative for itching.  Respiratory: Negative for cough, chest tightness, shortness of breath and wheezing.   Cardiovascular: Negative for chest pain.  Gastrointestinal: Negative for abdominal pain.  Genitourinary: Negative for difficulty urinating.  Skin: Negative for rash.  Allergic/Immunologic: Negative for environmental allergies and food allergies.  Neurological: Negative for headaches.   Objective: BP 110/64 (BP Location: Left Arm, Patient Position: Sitting, Cuff Size: Normal)   Pulse (!) 58   Temp 98.2 F (36.8 C) (Temporal)   Resp 17   SpO2 95%  There is no height or weight on file to calculate BMI. Physical Exam  Constitutional: She is oriented to person, place, and time. She appears well-developed and well-nourished.  HENT:  Head: Normocephalic and atraumatic.  Right Ear: External ear normal.  Left Ear: External ear normal.  Nose: Nose normal.  Mouth/Throat: Oropharynx is clear and moist.  Eyes: Conjunctivae and EOM are normal.  Neck: Neck supple.  Cardiovascular: Normal rate, regular rhythm and  normal heart sounds. Exam reveals no gallop and no friction rub.  No murmur heard. Pulmonary/Chest: Effort normal and breath sounds normal. She has no wheezes. She has no rales.  Abdominal: Soft.  Neurological: She is alert and oriented to person, place, and time.  Skin: Skin is warm. No rash noted.  Psychiatric: She has a normal mood and affect. Her behavior is normal.  Nursing note and vitals reviewed.  Previous notes and tests were reviewed. The plan was reviewed with the patient/family, and all questions/concerned were addressed.  It was my pleasure to see Kaliea today and participate in her care. Please feel free to contact me with any questions or concerns.  Sincerely,  Rexene Alberts, DO Allergy & Immunology  [facility] office: [phone] [facility] office: [place] office: [phone]

## 2019-01-25 DIAGNOSIS — M79672 Pain in left foot: Secondary | ICD-10-CM | POA: Diagnosis not present

## 2019-01-25 DIAGNOSIS — R609 Edema, unspecified: Secondary | ICD-10-CM | POA: Diagnosis not present

## 2019-01-31 DIAGNOSIS — Z20828 Contact with and (suspected) exposure to other viral communicable diseases: Secondary | ICD-10-CM | POA: Diagnosis not present

## 2019-02-04 DIAGNOSIS — D72819 Decreased white blood cell count, unspecified: Secondary | ICD-10-CM | POA: Diagnosis not present

## 2019-02-04 DIAGNOSIS — M0579 Rheumatoid arthritis with rheumatoid factor of multiple sites without organ or systems involvement: Secondary | ICD-10-CM | POA: Diagnosis not present

## 2019-02-04 DIAGNOSIS — Z79899 Other long term (current) drug therapy: Secondary | ICD-10-CM | POA: Diagnosis not present

## 2019-02-04 DIAGNOSIS — M810 Age-related osteoporosis without current pathological fracture: Secondary | ICD-10-CM | POA: Diagnosis not present

## 2019-03-01 ENCOUNTER — Other Ambulatory Visit: Payer: Self-pay | Admitting: Allergy

## 2019-03-01 DIAGNOSIS — F320 Major depressive disorder, single episode, mild: Secondary | ICD-10-CM | POA: Diagnosis not present

## 2019-03-01 DIAGNOSIS — R159 Full incontinence of feces: Secondary | ICD-10-CM | POA: Diagnosis not present

## 2019-03-01 DIAGNOSIS — I10 Essential (primary) hypertension: Secondary | ICD-10-CM | POA: Diagnosis not present

## 2019-03-01 DIAGNOSIS — Z0000 Encounter for general adult medical examination without abnormal findings: Secondary | ICD-10-CM | POA: Diagnosis not present

## 2019-03-01 DIAGNOSIS — K219 Gastro-esophageal reflux disease without esophagitis: Secondary | ICD-10-CM | POA: Diagnosis not present

## 2019-03-01 NOTE — Telephone Encounter (Signed)
If pt. Needs another rescue inhaler before her march appointment. She needs to see Dr. Maudie Mercury or Gareth Morgan FNP. Pt. Looks like she is using her rescue inhaler often.

## 2019-03-11 DIAGNOSIS — H35033 Hypertensive retinopathy, bilateral: Secondary | ICD-10-CM | POA: Diagnosis not present

## 2019-03-11 DIAGNOSIS — H35013 Changes in retinal vascular appearance, bilateral: Secondary | ICD-10-CM | POA: Diagnosis not present

## 2019-03-11 DIAGNOSIS — H402231 Chronic angle-closure glaucoma, bilateral, mild stage: Secondary | ICD-10-CM | POA: Diagnosis not present

## 2019-03-11 DIAGNOSIS — H43813 Vitreous degeneration, bilateral: Secondary | ICD-10-CM | POA: Diagnosis not present

## 2019-03-21 ENCOUNTER — Ambulatory Visit (INDEPENDENT_AMBULATORY_CARE_PROVIDER_SITE_OTHER): Payer: BC Managed Care – PPO | Admitting: Cardiovascular Disease

## 2019-03-21 ENCOUNTER — Other Ambulatory Visit: Payer: Self-pay

## 2019-03-21 ENCOUNTER — Encounter: Payer: Self-pay | Admitting: Cardiovascular Disease

## 2019-03-21 VITALS — BP 142/80 | HR 52 | Ht <= 58 in | Wt 151.2 lb

## 2019-03-21 DIAGNOSIS — R600 Localized edema: Secondary | ICD-10-CM | POA: Diagnosis not present

## 2019-03-21 DIAGNOSIS — I10 Essential (primary) hypertension: Secondary | ICD-10-CM

## 2019-03-21 DIAGNOSIS — R9431 Abnormal electrocardiogram [ECG] [EKG]: Secondary | ICD-10-CM

## 2019-03-21 DIAGNOSIS — R0602 Shortness of breath: Secondary | ICD-10-CM | POA: Diagnosis not present

## 2019-03-21 NOTE — Progress Notes (Signed)
Cardiology Office Note:   Date:  03/21/2019  NAME:  Rhonda Bush    MRN: [id number] DOB:  03-May-1950   PCP:  Donald Prose, MD  Cardiologist:  No primary care provider on file.   Referring MD: Donald Prose, MD   Chief Complaint  Patient presents with  . Shortness of Breath   History of Present Illness:   Rhonda Bush is a 69 y.o. female with a hx of asthma, hypertension, RA, glaucoma who is being seen today for the evaluation of LE edema at the request of Donald Prose, MD.  She presents for evaluation of 6 months of shortness of breath.  She reports she can intermittently become short of breath anytime.  It can occur while sitting or with exertion.  Nonetheless she is able to walk 5 miles per day.  She is also had some swelling in her lower extremities.  Her primary care physician recently stopped her amlodipine 2 months ago.  She is noticed improvement with that.  She is undergone testing by pulmonology and they say she does not have asthma.  She also reports she can get occasional palpitations that last seconds.  She still is able to maintain a impressive level of exercise.  She is a never smoker rarely consumes alcohol.  She has no symptoms of orthopnea, PND.  She reports her primary care physician wanted to exclude any cardiac etiology.  Her EKG simply demonstrates LVH.  There are no acute changes.  Symptoms appear to occur daily.  Recent laboratory data from primary care physician office dated 03/02/2019: Total cholesterol 94, triglycerides 92, HDL 58, LDL 119, TSH 2.60, serum creatinine 0.57, potassium 3.50  Past Medical History: Past Medical History:  Diagnosis Date  . Arthritis    rheumatoid arthritis -mild(feet,ankles,hand)-not bothersome now  . Asthma    mild- not routine use of meds or inhalers  . BURSITIS, LEFT SHOULDER 12/18/2006  . 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 DEPRESSION 09/25/2006  . GLAUCOMA NOS 09/25/2006  . History of hiatal  hernia   . HYPERTENSION 09/25/2006  . MIGRAINE HEADACHE 10/14/2006  . OSTEOPOROSIS NOS 10/16/2006  . PELVIC REGION, PAIN 12/17/2009  . RESTLESS LEG SYNDROME, SEVERE 10/16/2006   not bothered now  . SYNDROME, PREMENSTRUAL TENSION 10/14/2006    Past Surgical History: Past Surgical History:  Procedure Laterality Date  . ABDOMINAL HYSTERECTOMY  1992 ,  [place] in [place]   [place] bso-fibro[place]   . BREAST BIOPSY Right   . ESOPHAGOGASTRODUODENOSCOPY (EGD) WITH PROPOFOL N/A 02/13/2013   Procedure: ESOPHAGOGASTRODUODENOSCOPY (EGD) WITH PROPOFOL;  Surgeon: Arta Silence, MD;  Location: [facility];  Service: Endoscopy;  Laterality: N/A;  . EUS N/A 02/13/2013   Procedure: ESOPHAGEAL ENDOSCOPIC ULTRASOUND (EUS) RADIAL;  Surgeon: Arta Silence, MD;  Location: [facility];  Service: Endoscopy;  Laterality: N/A;  . PARATHYROIDECTOMY N/A 10/18/2018   Procedure: PARATHYROIDECTOMY;  Surgeon: Armandina Gemma, MD;  Location: [facility] ORS;  Service: General;  Laterality: N/A;  . PARATHYROIDECTOMY N/A 10/23/2018   Procedure: NECK EXPLORATION, EVACUATION OF HEMATOMA;  Surgeon: Armandina Gemma, MD;  Location: [facility];  Service: General;  Laterality: N/A;    Current Medications: Current Meds  Medication Sig  . albuterol (VENTOLIN HFA) 108 (90 Base) MCG/ACT inhaler 2 PUFFS EVERY 4-6 HOURS AS NEEDED FOR SHORTNESS OF BREATH, CHEST TIGHTNESS, COUGHING, WHEEZING.  Marland Kitchen amLODipine (NORVASC) 5 MG tablet Take 5 mg by mouth daily after breakfast.   . aspirin 81 MG tablet Take 81  mg by mouth daily after breakfast.   . cholecalciferol (VITAMIN D3) 25 MCG (1000 UT) tablet Take 1,000 Units by mouth daily after breakfast.   . dorzolamide (TRUSOPT) 2 % ophthalmic solution Place 1 drop into both eyes 2 (two) times daily.  Marland Kitchen FLUoxetine (PROZAC) 20 MG tablet Take 60 mg by mouth daily.  Marland Kitchen FLUoxetine HCl 60 MG TABS Take 60 mg by mouth daily after breakfast.   . fluticasone (FLOVENT HFA) 110 MCG/ACT inhaler Inhale 2 puffs into the lungs 2 (two) times  daily.  . folic acid (FOLVITE) 1 MG tablet Take 1 mg by mouth daily after breakfast.   . hydrochlorothiazide (HYDRODIURIL) 25 MG tablet   . latanoprost (XALATAN) 0.005 % ophthalmic solution Place 1 drop into both eyes at bedtime.  . methotrexate (RHEUMATREX) 2.5 MG tablet Take 12.5 mg by mouth every Monday.  . methotrexate (RHEUMATREX) 2.5 MG tablet   . metoprolol succinate (TOPROL-XL) 100 MG 24 hr tablet Take 100 mg by mouth daily after breakfast. Take with or immediately following a meal.   . SUMAtriptan (IMITREX) 100 MG tablet Take 100 mg by mouth every 2 (two) hours as needed for migraine. May repeat in 2 hours if headache persists or recurs.  Marland Kitchen telmisartan (MICARDIS) 80 MG tablet Take 80 mg by mouth daily after breakfast.      Allergies:    Patient has no known allergies.   Social History: Social History   Socioeconomic History  . Marital status: Divorced    Spouse name: Not on file  . Number of children: 1  . Years of education: Not on file  . Highest education level: Not on file  Occupational History  . Occupation: retired - Arts development officer  . Smoking status: Never Smoker  . Smokeless tobacco: Never Used  Substance and Sexual Activity  . Alcohol use: Yes    Comment: very rare  . Drug use: No  . Sexual activity: Not Currently  Other Topics Concern  . Not on file  Social History Narrative  . Not on file   Social Determinants of Health   Financial Resource Strain:   . Difficulty of Paying Living Expenses: Not on file  Food Insecurity:   . Worried About Charity fundraiser in the Last Year: Not on file  . Ran Out of Food in the Last Year: Not on file  Transportation Needs:   . Lack of Transportation (Medical): Not on file  . Lack of Transportation (Non-Medical): Not on file  Physical Activity:   . Days of Exercise per Week: Not on file  . Minutes of Exercise per Session: Not on file  Stress:   . Feeling of Stress : Not on file  Social Connections:     . Frequency of Communication with Friends and Family: Not on file  . Frequency of Social Gatherings with Friends and Family: Not on file  . Attends Religious Services: Not on file  . Active Member of Clubs or Organizations: Not on file  . Attends Archivist Meetings: Not on file  . Marital Status: Not on file     Family History: The patient's family history includes Heart attack in her father; Heart disease in her mother.  ROS:   All other ROS reviewed and negative. Pertinent positives noted in the HPI.     EKGs/Labs/Other Studies Reviewed:   The following studies were personally reviewed by me today:  EKG:  EKG is ordered today.  The ekg ordered today  demonstrates sinus bradycardia, heart rate 52, nonspecific ST-T changes, likely LVH, no evidence of prior infarction, and was personally reviewed by me.   Recent Labs: 10/24/2018: BUN 9; Creatinine, Ser 0.63; Hemoglobin 11.8; Platelets 285; Potassium 4.2; Sodium 138   Recent Lipid Panel    Component Value Date/Time   CHOL 194 11/11/2009 0807   TRIG 80.0 11/11/2009 0807   HDL 52.90 11/11/2009 0807   CHOLHDL 4 11/11/2009 0807   VLDL 16.0 11/11/2009 0807   LDLCALC 125 (H) 11/11/2009 0807   LDLDIRECT 144.2 11/06/2007 0811    Physical Exam:   VS:  BP (!) 142/80   Pulse (!) 52   Ht 4\' 10"  (1.473 m)   Wt 151 lb 3.2 oz (68.6 kg)   SpO2 97%   BMI 31.60 kg/m    Wt Readings from Last 3 Encounters:  03/21/19 151 lb 3.2 oz (68.6 kg)  12/19/18 150 lb 6.4 oz (68.2 kg)  10/23/18 140 lb 6.9 oz (63.7 kg)    General: Well nourished, well developed, in no acute distress Heart: Atraumatic, normal size  Eyes: PEERLA, EOMI  Neck: Supple, no JVD Endocrine: No thryomegaly Cardiac: Normal S1, S2; RRR; no murmurs, rubs, or gallops Lungs: Clear to auscultation bilaterally, no wheezing, rhonchi or rales  Abd: Soft, nontender, no hepatomegaly  Ext: No edema, pulses 2+ Musculoskeletal: No deformities, BUE and BLE strength normal and  equal Skin: Warm and dry, no rashes   Neuro: Alert and oriented to person, place, time, and situation, CNII-XII grossly intact, no focal deficits  Psych: Normal mood and affect   ASSESSMENT:   Keela Pluth is a 69 y.o. female who presents for the following: 1. Localized edema   2. Nonspecific abnormal electrocardiogram (ECG) (EKG)   3. SOB (shortness of breath)   4. Essential hypertension     PLAN:   1. Localized edema 2. Nonspecific abnormal electrocardiogram (ECG) (EKG) 3. SOB (shortness of breath) 4. Essential hypertension -She presents today for the evaluation of shortness of breath.  She is euvolemic on exam and has an unremarkable cardiac examination.  I suspect her lower extremity edema related to amlodipine.  She has no evidence of edema today on examination.  Her lungs are clear she has no elevated jugular venous distention.  We will check a BN P as well as an echocardiogram to exclude any structural heart disease.  I highly suspect this is not cardiac in etiology.  She is able to exercise 5 miles per day without any limitations.  It is a bit interesting that her symptoms occur at any time.  I will follow-up with her by phone if her results are abnormal.  We will plan to see her back as needed unless she has abnormalities on the above testing.  Disposition: Return if symptoms worsen or fail to improve.  Medication Adjustments/Labs and Tests Ordered: Current medicines are reviewed at length with the patient today.  Concerns regarding medicines are outlined above.  Orders Placed This Encounter  Procedures  . EKG 12-Lead  . ECHOCARDIOGRAM COMPLETE   No orders of the defined types were placed in this encounter.   Patient Instructions  Medication Instructions:  No changes   Lab Work: None ordered  Testing/Procedures: Schedule Echo  Follow-Up: At [facility], you and your health needs are our priority.  As part of our continuing mission to provide you with  exceptional heart care, we have created designated Provider Care Teams.  These Care Teams include your primary Cardiologist (physician) and  Advanced Practice Providers (APPs -  Physician Assistants and Nurse Practitioners) who all work together to provide you with the care you need, when you need it.  Your next appointment: Follow up as needed     Signed, [place] T. Audie Box, [place]  [address] [place], [place] [address] [phone]  03/21/2019 5:59 PM

## 2019-03-21 NOTE — Patient Instructions (Signed)
Medication Instructions:  No changes   Lab Work: None ordered  Testing/Procedures: Schedule Echo  Follow-Up: At [facility], you and your health needs are our priority.  As part of our continuing mission to provide you with exceptional heart care, we have created designated Provider Care Teams.  These Care Teams include your primary Cardiologist (physician) and Advanced Practice Providers (APPs -  Physician Assistants and Nurse Practitioners) who all work together to provide you with the care you need, when you need it.  Your next appointment: Follow up as needed

## 2019-03-22 DIAGNOSIS — R195 Other fecal abnormalities: Secondary | ICD-10-CM | POA: Diagnosis not present

## 2019-03-22 DIAGNOSIS — R159 Full incontinence of feces: Secondary | ICD-10-CM | POA: Diagnosis not present

## 2019-03-26 ENCOUNTER — Ambulatory Visit (HOSPITAL_COMMUNITY): Payer: BC Managed Care – PPO | Attending: Cardiology

## 2019-03-26 ENCOUNTER — Other Ambulatory Visit: Payer: Self-pay

## 2019-03-26 DIAGNOSIS — R9431 Abnormal electrocardiogram [ECG] [EKG]: Secondary | ICD-10-CM | POA: Insufficient documentation

## 2019-03-26 DIAGNOSIS — I10 Essential (primary) hypertension: Secondary | ICD-10-CM | POA: Diagnosis not present

## 2019-03-26 DIAGNOSIS — R0602 Shortness of breath: Secondary | ICD-10-CM | POA: Diagnosis not present

## 2019-03-26 DIAGNOSIS — R600 Localized edema: Secondary | ICD-10-CM | POA: Diagnosis not present

## 2019-04-08 DIAGNOSIS — I10 Essential (primary) hypertension: Secondary | ICD-10-CM | POA: Diagnosis not present

## 2019-04-08 DIAGNOSIS — R600 Localized edema: Secondary | ICD-10-CM | POA: Diagnosis not present

## 2019-04-16 DIAGNOSIS — R69 Illness, unspecified: Secondary | ICD-10-CM | POA: Diagnosis not present

## 2019-04-19 DIAGNOSIS — L918 Other hypertrophic disorders of the skin: Secondary | ICD-10-CM | POA: Diagnosis not present

## 2019-04-19 DIAGNOSIS — L820 Inflamed seborrheic keratosis: Secondary | ICD-10-CM | POA: Diagnosis not present

## 2019-05-17 DIAGNOSIS — D72819 Decreased white blood cell count, unspecified: Secondary | ICD-10-CM | POA: Diagnosis not present

## 2019-05-17 DIAGNOSIS — Z79899 Other long term (current) drug therapy: Secondary | ICD-10-CM | POA: Diagnosis not present

## 2019-05-17 DIAGNOSIS — M0579 Rheumatoid arthritis with rheumatoid factor of multiple sites without organ or systems involvement: Secondary | ICD-10-CM | POA: Diagnosis not present

## 2019-05-17 DIAGNOSIS — R202 Paresthesia of skin: Secondary | ICD-10-CM | POA: Diagnosis not present

## 2019-05-17 DIAGNOSIS — M810 Age-related osteoporosis without current pathological fracture: Secondary | ICD-10-CM | POA: Diagnosis not present

## 2019-05-22 ENCOUNTER — Ambulatory Visit: Payer: BC Managed Care – PPO | Admitting: Allergy

## 2019-07-15 DIAGNOSIS — E559 Vitamin D deficiency, unspecified: Secondary | ICD-10-CM | POA: Diagnosis not present

## 2019-07-15 DIAGNOSIS — E049 Nontoxic goiter, unspecified: Secondary | ICD-10-CM | POA: Diagnosis not present

## 2019-07-15 DIAGNOSIS — I10 Essential (primary) hypertension: Secondary | ICD-10-CM | POA: Diagnosis not present

## 2019-07-15 DIAGNOSIS — Z79899 Other long term (current) drug therapy: Secondary | ICD-10-CM | POA: Diagnosis not present

## 2019-07-17 DIAGNOSIS — G8929 Other chronic pain: Secondary | ICD-10-CM | POA: Diagnosis not present

## 2019-07-17 DIAGNOSIS — I10 Essential (primary) hypertension: Secondary | ICD-10-CM | POA: Diagnosis not present

## 2019-07-17 DIAGNOSIS — R69 Illness, unspecified: Secondary | ICD-10-CM | POA: Diagnosis not present

## 2019-07-19 DIAGNOSIS — I10 Essential (primary) hypertension: Secondary | ICD-10-CM | POA: Diagnosis not present

## 2019-07-19 DIAGNOSIS — E559 Vitamin D deficiency, unspecified: Secondary | ICD-10-CM | POA: Diagnosis not present

## 2019-07-19 DIAGNOSIS — E210 Primary hyperparathyroidism: Secondary | ICD-10-CM | POA: Diagnosis not present

## 2019-07-19 DIAGNOSIS — E049 Nontoxic goiter, unspecified: Secondary | ICD-10-CM | POA: Diagnosis not present

## 2019-07-19 DIAGNOSIS — Z79899 Other long term (current) drug therapy: Secondary | ICD-10-CM | POA: Diagnosis not present

## 2019-07-19 DIAGNOSIS — M810 Age-related osteoporosis without current pathological fracture: Secondary | ICD-10-CM | POA: Diagnosis not present

## 2019-08-21 DIAGNOSIS — H402231 Chronic angle-closure glaucoma, bilateral, mild stage: Secondary | ICD-10-CM | POA: Diagnosis not present

## 2019-08-28 DIAGNOSIS — M810 Age-related osteoporosis without current pathological fracture: Secondary | ICD-10-CM | POA: Diagnosis not present

## 2019-08-28 DIAGNOSIS — D72819 Decreased white blood cell count, unspecified: Secondary | ICD-10-CM | POA: Diagnosis not present

## 2019-08-28 DIAGNOSIS — Z79899 Other long term (current) drug therapy: Secondary | ICD-10-CM | POA: Diagnosis not present

## 2019-08-28 DIAGNOSIS — M0579 Rheumatoid arthritis with rheumatoid factor of multiple sites without organ or systems involvement: Secondary | ICD-10-CM | POA: Diagnosis not present

## 2019-08-28 DIAGNOSIS — R202 Paresthesia of skin: Secondary | ICD-10-CM | POA: Diagnosis not present

## 2019-08-30 DIAGNOSIS — I10 Essential (primary) hypertension: Secondary | ICD-10-CM | POA: Diagnosis not present

## 2019-08-30 DIAGNOSIS — R69 Illness, unspecified: Secondary | ICD-10-CM | POA: Diagnosis not present

## 2019-08-30 DIAGNOSIS — R600 Localized edema: Secondary | ICD-10-CM | POA: Diagnosis not present

## 2019-08-30 DIAGNOSIS — Z23 Encounter for immunization: Secondary | ICD-10-CM | POA: Diagnosis not present

## 2019-10-11 DIAGNOSIS — R0602 Shortness of breath: Secondary | ICD-10-CM | POA: Diagnosis not present

## 2019-10-11 DIAGNOSIS — I10 Essential (primary) hypertension: Secondary | ICD-10-CM | POA: Diagnosis not present

## 2019-10-11 DIAGNOSIS — R600 Localized edema: Secondary | ICD-10-CM | POA: Diagnosis not present

## 2019-10-11 DIAGNOSIS — J4520 Mild intermittent asthma, uncomplicated: Secondary | ICD-10-CM | POA: Diagnosis not present

## 2019-10-23 DIAGNOSIS — Z20822 Contact with and (suspected) exposure to covid-19: Secondary | ICD-10-CM | POA: Diagnosis not present

## 2019-11-13 DIAGNOSIS — H1013 Acute atopic conjunctivitis, bilateral: Secondary | ICD-10-CM | POA: Diagnosis not present

## 2019-11-13 DIAGNOSIS — H04123 Dry eye syndrome of bilateral lacrimal glands: Secondary | ICD-10-CM | POA: Diagnosis not present

## 2019-11-13 DIAGNOSIS — H402231 Chronic angle-closure glaucoma, bilateral, mild stage: Secondary | ICD-10-CM | POA: Diagnosis not present

## 2019-11-28 DIAGNOSIS — Z79899 Other long term (current) drug therapy: Secondary | ICD-10-CM | POA: Diagnosis not present

## 2019-11-28 DIAGNOSIS — M810 Age-related osteoporosis without current pathological fracture: Secondary | ICD-10-CM | POA: Diagnosis not present

## 2019-11-28 DIAGNOSIS — M0579 Rheumatoid arthritis with rheumatoid factor of multiple sites without organ or systems involvement: Secondary | ICD-10-CM | POA: Diagnosis not present

## 2019-11-28 DIAGNOSIS — D72819 Decreased white blood cell count, unspecified: Secondary | ICD-10-CM | POA: Diagnosis not present

## 2019-11-29 ENCOUNTER — Other Ambulatory Visit: Payer: Self-pay | Admitting: Family Medicine

## 2019-11-29 DIAGNOSIS — Z0000 Encounter for general adult medical examination without abnormal findings: Secondary | ICD-10-CM

## 2019-12-24 ENCOUNTER — Ambulatory Visit: Payer: Medicare HMO

## 2020-01-10 ENCOUNTER — Ambulatory Visit
Admission: RE | Admit: 2020-01-10 | Discharge: 2020-01-10 | Disposition: A | Discharge status: Home / Self Care | Payer: Medicare HMO | Source: Ambulatory Visit | Attending: Family Medicine | Admitting: Family Medicine

## 2020-01-10 ENCOUNTER — Other Ambulatory Visit: Payer: Self-pay

## 2020-01-10 DIAGNOSIS — Z0000 Encounter for general adult medical examination without abnormal findings: Secondary | ICD-10-CM

## 2020-01-10 DIAGNOSIS — Z1231 Encounter for screening mammogram for malignant neoplasm of breast: Secondary | ICD-10-CM | POA: Diagnosis not present

## 2020-01-13 DIAGNOSIS — L918 Other hypertrophic disorders of the skin: Secondary | ICD-10-CM | POA: Diagnosis not present

## 2020-02-13 DIAGNOSIS — G8929 Other chronic pain: Secondary | ICD-10-CM | POA: Diagnosis not present

## 2020-02-13 DIAGNOSIS — I10 Essential (primary) hypertension: Secondary | ICD-10-CM | POA: Diagnosis not present

## 2020-02-13 DIAGNOSIS — R69 Illness, unspecified: Secondary | ICD-10-CM | POA: Diagnosis not present

## 2020-02-28 DIAGNOSIS — H1013 Acute atopic conjunctivitis, bilateral: Secondary | ICD-10-CM | POA: Diagnosis not present

## 2020-02-28 DIAGNOSIS — M06071 Rheumatoid arthritis without rheumatoid factor, right ankle and foot: Secondary | ICD-10-CM | POA: Diagnosis not present

## 2020-02-28 DIAGNOSIS — Z79899 Other long term (current) drug therapy: Secondary | ICD-10-CM | POA: Diagnosis not present

## 2020-02-28 DIAGNOSIS — M79676 Pain in unspecified toe(s): Secondary | ICD-10-CM | POA: Diagnosis not present

## 2020-02-28 DIAGNOSIS — M19031 Primary osteoarthritis, right wrist: Secondary | ICD-10-CM | POA: Diagnosis not present

## 2020-02-28 DIAGNOSIS — M79671 Pain in right foot: Secondary | ICD-10-CM | POA: Diagnosis not present

## 2020-02-28 DIAGNOSIS — M19042 Primary osteoarthritis, left hand: Secondary | ICD-10-CM | POA: Diagnosis not present

## 2020-02-28 DIAGNOSIS — M79672 Pain in left foot: Secondary | ICD-10-CM | POA: Diagnosis not present

## 2020-02-28 DIAGNOSIS — H402231 Chronic angle-closure glaucoma, bilateral, mild stage: Secondary | ICD-10-CM | POA: Diagnosis not present

## 2020-02-28 DIAGNOSIS — H04123 Dry eye syndrome of bilateral lacrimal glands: Secondary | ICD-10-CM | POA: Diagnosis not present

## 2020-02-28 DIAGNOSIS — M7989 Other specified soft tissue disorders: Secondary | ICD-10-CM | POA: Diagnosis not present

## 2020-02-28 DIAGNOSIS — H25013 Cortical age-related cataract, bilateral: Secondary | ICD-10-CM | POA: Diagnosis not present

## 2020-02-28 DIAGNOSIS — M79642 Pain in left hand: Secondary | ICD-10-CM | POA: Diagnosis not present

## 2020-02-28 DIAGNOSIS — D72819 Decreased white blood cell count, unspecified: Secondary | ICD-10-CM | POA: Diagnosis not present

## 2020-02-28 DIAGNOSIS — M19032 Primary osteoarthritis, left wrist: Secondary | ICD-10-CM | POA: Diagnosis not present

## 2020-02-28 DIAGNOSIS — M79641 Pain in right hand: Secondary | ICD-10-CM | POA: Diagnosis not present

## 2020-02-28 DIAGNOSIS — M0689 Other specified rheumatoid arthritis, multiple sites: Secondary | ICD-10-CM | POA: Diagnosis not present

## 2020-02-28 DIAGNOSIS — M810 Age-related osteoporosis without current pathological fracture: Secondary | ICD-10-CM | POA: Diagnosis not present

## 2020-02-28 DIAGNOSIS — M0579 Rheumatoid arthritis with rheumatoid factor of multiple sites without organ or systems involvement: Secondary | ICD-10-CM | POA: Diagnosis not present

## 2020-03-22 DIAGNOSIS — Z1152 Encounter for screening for COVID-19: Secondary | ICD-10-CM | POA: Diagnosis not present

## 2020-04-03 DIAGNOSIS — Z20822 Contact with and (suspected) exposure to covid-19: Secondary | ICD-10-CM | POA: Diagnosis not present

## 2020-04-20 DIAGNOSIS — H402211 Chronic angle-closure glaucoma, right eye, mild stage: Secondary | ICD-10-CM | POA: Diagnosis not present

## 2020-04-21 DIAGNOSIS — M25571 Pain in right ankle and joints of right foot: Secondary | ICD-10-CM | POA: Diagnosis not present

## 2020-04-21 DIAGNOSIS — M069 Rheumatoid arthritis, unspecified: Secondary | ICD-10-CM | POA: Diagnosis not present

## 2020-04-22 DIAGNOSIS — Z01419 Encounter for gynecological examination (general) (routine) without abnormal findings: Secondary | ICD-10-CM | POA: Diagnosis not present

## 2020-04-22 DIAGNOSIS — R600 Localized edema: Secondary | ICD-10-CM | POA: Diagnosis not present

## 2020-04-22 DIAGNOSIS — R140 Abdominal distension (gaseous): Secondary | ICD-10-CM | POA: Diagnosis not present

## 2020-04-22 DIAGNOSIS — Z23 Encounter for immunization: Secondary | ICD-10-CM | POA: Diagnosis not present

## 2020-04-22 DIAGNOSIS — M069 Rheumatoid arthritis, unspecified: Secondary | ICD-10-CM | POA: Diagnosis not present

## 2020-04-22 DIAGNOSIS — R69 Illness, unspecified: Secondary | ICD-10-CM | POA: Diagnosis not present

## 2020-04-22 DIAGNOSIS — Z0000 Encounter for general adult medical examination without abnormal findings: Secondary | ICD-10-CM | POA: Diagnosis not present

## 2020-04-22 DIAGNOSIS — I10 Essential (primary) hypertension: Secondary | ICD-10-CM | POA: Diagnosis not present

## 2020-04-22 DIAGNOSIS — H409 Unspecified glaucoma: Secondary | ICD-10-CM | POA: Diagnosis not present

## 2020-04-22 DIAGNOSIS — E210 Primary hyperparathyroidism: Secondary | ICD-10-CM | POA: Diagnosis not present

## 2020-04-22 DIAGNOSIS — M810 Age-related osteoporosis without current pathological fracture: Secondary | ICD-10-CM | POA: Diagnosis not present

## 2020-05-28 DIAGNOSIS — D72819 Decreased white blood cell count, unspecified: Secondary | ICD-10-CM | POA: Diagnosis not present

## 2020-05-28 DIAGNOSIS — M7989 Other specified soft tissue disorders: Secondary | ICD-10-CM | POA: Diagnosis not present

## 2020-05-28 DIAGNOSIS — M79643 Pain in unspecified hand: Secondary | ICD-10-CM | POA: Diagnosis not present

## 2020-05-28 DIAGNOSIS — M810 Age-related osteoporosis without current pathological fracture: Secondary | ICD-10-CM | POA: Diagnosis not present

## 2020-05-28 DIAGNOSIS — Z79899 Other long term (current) drug therapy: Secondary | ICD-10-CM | POA: Diagnosis not present

## 2020-05-28 DIAGNOSIS — M0579 Rheumatoid arthritis with rheumatoid factor of multiple sites without organ or systems involvement: Secondary | ICD-10-CM | POA: Diagnosis not present

## 2020-05-28 DIAGNOSIS — M6530 Trigger finger, unspecified finger: Secondary | ICD-10-CM | POA: Diagnosis not present

## 2020-05-28 DIAGNOSIS — M79676 Pain in unspecified toe(s): Secondary | ICD-10-CM | POA: Diagnosis not present

## 2020-06-22 ENCOUNTER — Ambulatory Visit: Payer: Self-pay | Admitting: Allergy

## 2020-06-25 DIAGNOSIS — H402231 Chronic angle-closure glaucoma, bilateral, mild stage: Secondary | ICD-10-CM | POA: Diagnosis not present

## 2020-06-25 DIAGNOSIS — H04123 Dry eye syndrome of bilateral lacrimal glands: Secondary | ICD-10-CM | POA: Diagnosis not present

## 2020-07-08 ENCOUNTER — Ambulatory Visit: Payer: Medicare HMO | Admitting: Allergy

## 2020-07-08 ENCOUNTER — Encounter: Payer: Self-pay | Admitting: Allergy

## 2020-07-08 ENCOUNTER — Other Ambulatory Visit: Payer: Self-pay

## 2020-07-08 VITALS — BP 108/80 | HR 65 | Temp 97.8°F | Resp 12 | Ht 58.5 in | Wt 149.8 lb

## 2020-07-08 DIAGNOSIS — J4540 Moderate persistent asthma, uncomplicated: Secondary | ICD-10-CM | POA: Diagnosis not present

## 2020-07-08 MED ORDER — BREO ELLIPTA 100-25 MCG/INH IN AEPB: 1.0000 | INHALATION_SPRAY | Freq: Every day | RESPIRATORY_TRACT | 5 refills | Status: DC

## 2020-07-08 MED ORDER — ALBUTEROL SULFATE HFA 108 (90 BASE) MCG/ACT IN AERS: 2.0000 | INHALATION_SPRAY | RESPIRATORY_TRACT | 1 refills | Status: DC | PRN

## 2020-07-08 NOTE — Patient Instructions (Addendum)
Asthma:   Today's spirometry was normal.   Daily controller medication(s): START Breo 122mcg 1 puff once a day and rinse mouth after each use.  Sample given and demonstrated proper use.   May use albuterol rescue inhaler 2 puffs every 4 to 6 hours as needed for shortness of breath, chest tightness, coughing, and wheezing. May use albuterol rescue inhaler 2 puffs 5 to 15 minutes prior to strenuous physical activities. Monitor frequency of use.  Asthma control goals:  Full participation in all desired activities (may need albuterol before activity) Albuterol use two times or less a week on average (not counting use with activity) Cough interfering with sleep two times or less a month Oral steroids no more than once a year No hospitalizations  Follow up in 3 months or sooner if needed.

## 2020-07-08 NOTE — Assessment & Plan Note (Signed)
Past history - Patient was apparently diagnosed with asthma over 10 years ago but worse in the last 2 months.  She used to follow in our office many years ago. Patient has not been using any type of inhalers but having daily symptoms. 2020 spirometry shows some restriction with minimal improvement in FEV1 post bronchodilator treatment.  Patient felt clinically better. Interim history - stopped using daily inhalers again over 1 year ago. Had COVID-19 in 2021. Daily symptoms with coughing, wheezing, shortness of breath.   Today's ACT score is 12.  Today's spirometry was normal with 16% improvement in FEV1 post bronchodilator treatment. Clinically feeling improved.   Daily controller medication(s): START Breo 122mcg 1 puff once a day and rinse mouth after each use - hopefully once a day dosing will improve adherence.   Sample given and demonstrated proper use.   May use albuterol rescue inhaler 2 puffs every 4 to 6 hours as needed for shortness of breath, chest tightness, coughing, and wheezing. May use albuterol rescue inhaler 2 puffs 5 to 15 minutes prior to strenuous physical activities. Monitor frequency of use.   Repeat spirometry at next visit.

## 2020-07-08 NOTE — Progress Notes (Signed)
Follow Up Note  RE: Rhonda Bush MRN: [id number] DOB: 12/17/1950 Date of Office Visit: 07/08/2020  Referring provider: Donald Prose, MD Primary care provider: Donald Prose, MD  Chief Complaint: Asthma (Flare up often the last month or so./ACT: 12)  History of Present Illness: I had the pleasure of seeing Rhonda Bush for a follow up visit at the [facility] on 07/08/2020. She is a 70 y.o. female, who is being followed for asthma and chronic rhinitis. Her previous allergy office visit was on 01/16/2019 with Dr. Maudie Mercury. Today is a regular follow up visit - worsening asthma symptoms.  Mild persistent asthma  Having issues with coughing (dry, no mucous), shortness of breath, wheezing for the past 1 month. No URI symptoms recently. Had COVID-19 in the fall 2021.   Currently not using Flovent 16mcg on a daily basis as she was noticing improvement in her breathing and she stopped daily use over 1 year ago.  Using albuterol once a week with good benefit.  Denies any ER/urgent care visits or prednisone use since the last visit.  Assessment and Plan: Rhonda Bush is a 70 y.o. female with: Moderate persistent asthma without complication Past history - Patient was apparently diagnosed with asthma over 10 years ago but worse in the last 2 months.  She used to follow in our office many years ago. Patient has not been using any type of inhalers but having daily symptoms. 2020 spirometry shows some restriction with minimal improvement in FEV1 post bronchodilator treatment.  Patient felt clinically better. Interim history - stopped using daily inhalers again over 1 year ago. Had COVID-19 in 2021. Daily symptoms with coughing, wheezing, shortness of breath.   Today's ACT score is 12.  Today's spirometry was normal with 16% improvement in FEV1 post bronchodilator treatment. Clinically feeling improved.   Daily controller medication(s): START Breo 15mcg 1 puff once a day and rinse mouth  after each use - hopefully once a day dosing will improve adherence.   Sample given and demonstrated proper use.   May use albuterol rescue inhaler 2 puffs every 4 to 6 hours as needed for shortness of breath, chest tightness, coughing, and wheezing. May use albuterol rescue inhaler 2 puffs 5 to 15 minutes prior to strenuous physical activities. Monitor frequency of use.   Repeat spirometry at next visit.   Return in about 3 months (around 10/07/2020).  Meds ordered this encounter  Medications  . fluticasone furoate-vilanterol (BREO ELLIPTA) 100-25 MCG/INH AEPB    Sig: Inhale 1 puff into the lungs daily. Rinse mouth after each use    Dispense:  60 each    Refill:  5  . albuterol (VENTOLIN HFA) 108 (90 Base) MCG/ACT inhaler    Sig: Inhale 2 puffs into the lungs every 4 (four) hours as needed for wheezing or shortness of breath (coughing fits).    Dispense:  18 g    Refill:  1   Lab Orders  No laboratory test(s) ordered today    Diagnostics: Spirometry:  Tracings reviewed. Her effort: Good reproducible efforts. FVC: 1.46L FEV1: 1.13L, 71% predicted FEV1/FVC ratio: 77% Interpretation: Spirometry consistent with normal pattern with 16% improvement in FEV1 post bronchodilator treatment. Clinically feeling improved.   Please see scanned spirometry results for details.  Medication List:  Current Outpatient Medications  Medication Sig Dispense Refill  . albuterol (VENTOLIN HFA) 108 (90 Base) MCG/ACT inhaler Inhale 2 puffs into the lungs every 4 (four) hours as needed for wheezing or shortness  of breath (coughing fits). 18 g 1  . Alum Hydroxide-Mag Trisilicate (GAVISCON) 38-75.6 MG CHEW 2 tablets after meals and at bedtime as needed    . amLODipine (NORVASC) 5 MG tablet Take 5 mg by mouth daily after breakfast.     . aspirin 81 MG tablet Take 81 mg by mouth daily after breakfast.    . cholecalciferol (VITAMIN D3) 25 MCG (1000 UT) tablet Take 1,000 Units by mouth daily after breakfast.      . dorzolamide (TRUSOPT) 2 % ophthalmic solution Place 1 drop into both eyes 2 (two) times daily.    Marland Kitchen FLUoxetine (PROZAC) 20 MG tablet Take 60 mg by mouth daily.    Marland Kitchen FLUoxetine HCl 60 MG TABS Take 60 mg by mouth daily after breakfast.     . fluticasone furoate-vilanterol (BREO ELLIPTA) 100-25 MCG/INH AEPB Inhale 1 puff into the lungs daily. Rinse mouth after each use 60 each 5  . folic acid (FOLVITE) 1 MG tablet Take 1 mg by mouth daily after breakfast.     . furosemide (LASIX) 20 MG tablet Take 20 mg by mouth daily. As needed    . hydrochlorothiazide (HYDRODIURIL) 25 MG tablet     . hydroxychloroquine (PLAQUENIL) 200 MG tablet See admin instructions.    Marland Kitchen latanoprost (XALATAN) 0.005 % ophthalmic solution Place 1 drop into both eyes at bedtime.    . methotrexate (RHEUMATREX) 2.5 MG tablet Take 12.5 mg by mouth every Monday.    . metoprolol succinate (TOPROL-XL) 100 MG 24 hr tablet Take 100 mg by mouth daily after breakfast. Take with or immediately following a meal.    . Vitamin D-Vitamin K (VITAMIN K2-VITAMIN D3) 45-2000 MCG-UNIT CAPS 1 tablet    . methotrexate (RHEUMATREX) 2.5 MG tablet     . SUMAtriptan (IMITREX) 100 MG tablet Take 100 mg by mouth every 2 (two) hours as needed for migraine. May repeat in 2 hours if headache persists or recurs. (Patient not taking: Reported on 07/08/2020)    . telmisartan (MICARDIS) 80 MG tablet Take 80 mg by mouth daily after breakfast.  (Patient not taking: Reported on 07/08/2020)     No current facility-administered medications for this visit.   Allergies: Allergies  Allergen Reactions  . Eggs Or Egg-Derived Products Other (See Comments)  . Other Other (See Comments)   I reviewed her past medical history, social history, family history, and environmental history and no significant changes have been reported from her previous visit.  Review of Systems  Constitutional: Negative for appetite change, chills, fever and unexpected weight change.  HENT:  Negative for congestion and rhinorrhea.   Eyes: Negative for itching.  Respiratory: Positive for cough, shortness of breath and wheezing. Negative for chest tightness.   Cardiovascular: Negative for chest pain.  Gastrointestinal: Negative for abdominal pain.  Genitourinary: Negative for difficulty urinating.  Skin: Negative for rash.  Allergic/Immunologic: Negative for environmental allergies and food allergies.  Neurological: Negative for headaches.   Objective: BP 108/80   Pulse 65   Temp 97.8 F (36.6 C)   Resp 12   Ht 4' 10.5" (1.486 m)   Wt 149 lb 12.8 oz (67.9 kg)   SpO2 98%   BMI 30.78 kg/m  Body mass index is 30.78 kg/m. Physical Exam Vitals and nursing note reviewed.  Constitutional:      Appearance: She is well-developed.  HENT:     Head: Normocephalic and atraumatic.     Right Ear: External ear normal.     Left Ear:  External ear normal.     Nose: Nose normal.  Eyes:     Conjunctiva/sclera: Conjunctivae normal.  Cardiovascular:     Rate and Rhythm: Normal rate and regular rhythm.     Heart sounds: Normal heart sounds. No murmur heard. No friction rub. No gallop.   Pulmonary:     Effort: Pulmonary effort is normal.     Breath sounds: Normal breath sounds. No wheezing or rales.  Abdominal:     Palpations: Abdomen is soft.  Musculoskeletal:     Cervical back: Neck supple.  Skin:    General: Skin is warm.     Findings: No rash.  Neurological:     Mental Status: She is alert and oriented to person, place, and time.  Psychiatric:        Behavior: Behavior normal.    Previous notes and tests were reviewed. The plan was reviewed with the patient/family, and all questions/concerned were addressed.  It was my pleasure to see Kaly today and participate in her care. Please feel free to contact me with any questions or concerns.  Sincerely,  Rexene Alberts, DO Allergy & Immunology  [facility] [place] office:  [place] office: [phone]

## 2020-07-17 DIAGNOSIS — M810 Age-related osteoporosis without current pathological fracture: Secondary | ICD-10-CM | POA: Diagnosis not present

## 2020-07-17 DIAGNOSIS — M8589 Other specified disorders of bone density and structure, multiple sites: Secondary | ICD-10-CM | POA: Diagnosis not present

## 2020-07-17 DIAGNOSIS — E559 Vitamin D deficiency, unspecified: Secondary | ICD-10-CM | POA: Diagnosis not present

## 2020-07-17 DIAGNOSIS — E210 Primary hyperparathyroidism: Secondary | ICD-10-CM | POA: Diagnosis not present

## 2020-07-24 DIAGNOSIS — E559 Vitamin D deficiency, unspecified: Secondary | ICD-10-CM | POA: Diagnosis not present

## 2020-07-24 DIAGNOSIS — M810 Age-related osteoporosis without current pathological fracture: Secondary | ICD-10-CM | POA: Diagnosis not present

## 2020-07-24 DIAGNOSIS — I10 Essential (primary) hypertension: Secondary | ICD-10-CM | POA: Diagnosis not present

## 2020-07-24 DIAGNOSIS — E210 Primary hyperparathyroidism: Secondary | ICD-10-CM | POA: Diagnosis not present

## 2020-08-19 ENCOUNTER — Other Ambulatory Visit: Payer: Self-pay | Admitting: Family Medicine

## 2020-08-19 DIAGNOSIS — Z03818 Encounter for observation for suspected exposure to other biological agents ruled out: Secondary | ICD-10-CM | POA: Diagnosis not present

## 2020-08-19 DIAGNOSIS — Z1231 Encounter for screening mammogram for malignant neoplasm of breast: Secondary | ICD-10-CM

## 2020-08-31 DIAGNOSIS — Z79899 Other long term (current) drug therapy: Secondary | ICD-10-CM | POA: Diagnosis not present

## 2020-08-31 DIAGNOSIS — M810 Age-related osteoporosis without current pathological fracture: Secondary | ICD-10-CM | POA: Diagnosis not present

## 2020-08-31 DIAGNOSIS — D72819 Decreased white blood cell count, unspecified: Secondary | ICD-10-CM | POA: Diagnosis not present

## 2020-08-31 DIAGNOSIS — M0579 Rheumatoid arthritis with rheumatoid factor of multiple sites without organ or systems involvement: Secondary | ICD-10-CM | POA: Diagnosis not present

## 2020-09-22 DIAGNOSIS — M25562 Pain in left knee: Secondary | ICD-10-CM | POA: Diagnosis not present

## 2020-10-12 ENCOUNTER — Encounter: Payer: Self-pay | Admitting: Allergy

## 2020-10-12 ENCOUNTER — Ambulatory Visit (INDEPENDENT_AMBULATORY_CARE_PROVIDER_SITE_OTHER): Payer: Medicare HMO | Admitting: Allergy

## 2020-10-12 ENCOUNTER — Other Ambulatory Visit: Payer: Self-pay

## 2020-10-12 VITALS — BP 136/86 | HR 57 | Temp 98.4°F | Resp 16 | Ht 58.5 in | Wt 149.2 lb

## 2020-10-12 DIAGNOSIS — R12 Heartburn: Secondary | ICD-10-CM | POA: Diagnosis not present

## 2020-10-12 DIAGNOSIS — J4540 Moderate persistent asthma, uncomplicated: Secondary | ICD-10-CM | POA: Diagnosis not present

## 2020-10-12 MED ORDER — FLUTICASONE FUROATE-VILANTEROL 100-25 MCG/INH IN AEPB: 1.0000 | INHALATION_SPRAY | Freq: Every day | RESPIRATORY_TRACT | 5 refills | Status: DC

## 2020-10-12 MED ORDER — ALBUTEROL SULFATE HFA 108 (90 BASE) MCG/ACT IN AERS: 2.0000 | INHALATION_SPRAY | RESPIRATORY_TRACT | 1 refills | Status: DC | PRN

## 2020-10-12 NOTE — Assessment & Plan Note (Signed)
   See below for lifestyle and dietary modifications.

## 2020-10-12 NOTE — Assessment & Plan Note (Signed)
Past history - Patient was apparently diagnosed with asthma over 10 years ago but worse in the last 2 months.  She used to follow in our office many years ago. Patient has not been using any type of inhalers but having daily symptoms. 2020 spirometry shows some restriction with minimal improvement in FEV1 post bronchodilator treatment.  Patient felt clinically better. Covid-19 in 2021. Interim history - doing much better Breo. Noticed some nocturnal awakenings from coughing and usually drinks water then goes back to sleep. History of reflux/heartburn.   Today's ACT score is 22.  Today's spirometry was normal.  Daily controller medication(s): continue Breo 172mg 1 puff once a day and rinse mouth after each use.  Take 2 puffs albuterol when you wake up from coughing and see if it helps.  If coughing does not improve with albuterol then will do a trial of reflux/heartburn medication next.  May use albuterol rescue inhaler 2 puffs every 4 to 6 hours as needed for shortness of breath, chest tightness, coughing, and wheezing. May use albuterol rescue inhaler 2 puffs 5 to 15 minutes prior to strenuous physical activities. Monitor frequency of use.   Get spirometry at next visit.

## 2020-10-12 NOTE — Progress Notes (Signed)
Follow Up Note  RE: Rhonda Bush MRN: [id number] DOB: 12-Jul-1950 Date of Office Visit: 10/12/2020  Referring provider: Donald Prose, MD Primary care provider: Donald Prose, MD  Chief Complaint: Asthma (Says it is well. Trouble when it is really hot or strenuous work.)  History of Present Illness: I had the pleasure of seeing Rhonda Bush for a follow up visit at the [facility] on 10/12/2020. She is a 70 y.o. female, who is being followed for asthma. Her previous allergy office visit was on 07/08/2020 with Dr. Maudie Mercury. Today is a regular follow up visit.  Moderate persistent asthma ACT score 22.   Denies any SOB, wheezing, chest tightness, ER/urgent care visits or prednisone use since the last visit.  Sometimes coughing at night which wakes her up. She usually drinks water and then goes back to sleep.  Currently on Breo 189mg 1 puff once a day and doing well on it.  Using albuterol once a week at most when she is exerting herself.  History of heartburn/reflux in the past which patient says is well controlled now.  Assessment and Plan: MKrosbyis a 70y.o. female with: Moderate persistent asthma without complication Past history - Patient was apparently diagnosed with asthma over 10 years ago but worse in the last 2 months.  She used to follow in our office many years ago. Patient has not been using any type of inhalers but having daily symptoms. 2020 spirometry shows some restriction with minimal improvement in FEV1 post bronchodilator treatment.  Patient felt clinically better. Covid-19 in 2021. Interim history - doing much better Breo. Noticed some nocturnal awakenings from coughing and usually drinks water then goes back to sleep. History of reflux/heartburn.  Today's ACT score is 22. Today's spirometry was normal. Daily controller medication(s): continue Breo 1018m 1 puff once a day and rinse mouth after each use. Take 2 puffs albuterol when you wake up from  coughing and see if it helps. If coughing does not improve with albuterol then will do a trial of reflux/heartburn medication next. May use albuterol rescue inhaler 2 puffs every 4 to 6 hours as needed for shortness of breath, chest tightness, coughing, and wheezing. May use albuterol rescue inhaler 2 puffs 5 to 15 minutes prior to strenuous physical activities. Monitor frequency of use.  Get spirometry at next visit.  Heartburn See below for lifestyle and dietary modifications.  Return in about 3 months (around 01/12/2021).  Meds ordered this encounter  Medications   albuterol (VENTOLIN HFA) 108 (90 Base) MCG/ACT inhaler    Sig: Inhale 2 puffs into the lungs every 4 (four) hours as needed for wheezing or shortness of breath (coughing fits).    Dispense:  18 g    Refill:  1   fluticasone furoate-vilanterol (BREO ELLIPTA) 100-25 MCG/INH AEPB    Sig: Inhale 1 puff into the lungs daily. Rinse mouth after each use    Dispense:  60 each    Refill:  5   Lab Orders  No laboratory test(s) ordered today    Diagnostics: Spirometry:  Tracings reviewed. Her effort: Good reproducible efforts. FVC: 1.76L FEV1: 1.42L, 102% predicted FEV1/FVC ratio: 81% Interpretation: Spirometry consistent with normal pattern.  Please see scanned spirometry results for details.  Medication List:  Current Outpatient Medications  Medication Sig Dispense Refill   Alum Hydroxide-Mag Trisilicate (GAVISCON) 80A999333G CHEW 2 tablets after meals and at bedtime as needed     amLODipine (NORVASC) 5 MG tablet Take  5 mg by mouth daily after breakfast.      aspirin 81 MG tablet Take 81 mg by mouth daily after breakfast.     cholecalciferol (VITAMIN D3) 25 MCG (1000 UT) tablet Take 1,000 Units by mouth daily after breakfast.      dorzolamide (TRUSOPT) 2 % ophthalmic solution Place 1 drop into both eyes 2 (two) times daily.     FLUoxetine (PROZAC) 20 MG tablet Take 60 mg by mouth daily.     FLUoxetine HCl 60 MG TABS  Take 60 mg by mouth daily after breakfast.      folic acid (FOLVITE) 1 MG tablet Take 1 mg by mouth daily after breakfast.      furosemide (LASIX) 20 MG tablet Take 20 mg by mouth daily. As needed     hydrochlorothiazide (HYDRODIURIL) 25 MG tablet      hydroxychloroquine (PLAQUENIL) 200 MG tablet See admin instructions.     ibandronate (BONIVA) 150 MG tablet Take 150 mg by mouth every 30 (thirty) days.     latanoprost (XALATAN) 0.005 % ophthalmic solution Place 1 drop into both eyes at bedtime.     methotrexate (RHEUMATREX) 2.5 MG tablet Take 12.5 mg by mouth every Monday.     metoprolol succinate (TOPROL-XL) 100 MG 24 hr tablet Take 100 mg by mouth daily after breakfast. Take with or immediately following a meal.     Oyster Shell Calcium 500 MG TABS 1 tablet with meals     SUMAtriptan (IMITREX) 100 MG tablet Take 100 mg by mouth every 2 (two) hours as needed for migraine. May repeat in 2 hours if headache persists or recurs.     telmisartan (MICARDIS) 80 MG tablet Take 80 mg by mouth daily after breakfast.     Vitamin D-Vitamin K (VITAMIN K2-VITAMIN D3) 45-2000 MCG-UNIT CAPS 1 tablet     albuterol (VENTOLIN HFA) 108 (90 Base) MCG/ACT inhaler Inhale 2 puffs into the lungs every 4 (four) hours as needed for wheezing or shortness of breath (coughing fits). 18 g 1   fluticasone furoate-vilanterol (BREO ELLIPTA) 100-25 MCG/INH AEPB Inhale 1 puff into the lungs daily. Rinse mouth after each use 60 each 5   No current facility-administered medications for this visit.   Allergies: Allergies  Allergen Reactions   Eggs Or Egg-Derived Products Nausea And Vomiting   Influenza Vaccines Nausea And Vomiting   Other     Egg   I reviewed her past medical history, social history, family history, and environmental history and no significant changes have been reported from her previous visit.  Review of Systems  Constitutional:  Negative for appetite change, chills, fever and unexpected weight change.   HENT:  Negative for congestion and rhinorrhea.   Eyes:  Negative for itching.  Respiratory:  Positive for cough. Negative for chest tightness, shortness of breath and wheezing.   Cardiovascular:  Negative for chest pain.  Gastrointestinal:  Negative for abdominal pain.  Genitourinary:  Negative for difficulty urinating.  Skin:  Negative for rash.  Allergic/Immunologic: Negative for environmental allergies and food allergies.  Neurological:  Negative for headaches.   Objective: BP 136/86   Pulse (!) 57   Temp 98.4 F (36.9 C) (Temporal)   Resp 16   Ht 4' 10.5" (1.486 m)   Wt 149 lb 3.2 oz (67.7 kg)   SpO2 96%   BMI 30.65 kg/m  Body mass index is 30.65 kg/m. Physical Exam Vitals and nursing note reviewed.  Constitutional:      Appearance: She  is well-developed.  HENT:     Head: Normocephalic and atraumatic.     Right Ear: External ear normal.     Left Ear: External ear normal.     Nose: Nose normal.  Eyes:     Conjunctiva/sclera: Conjunctivae normal.  Cardiovascular:     Rate and Rhythm: Normal rate and regular rhythm.     Heart sounds: Normal heart sounds. No murmur heard.   No friction rub. No gallop.  Pulmonary:     Effort: Pulmonary effort is normal.     Breath sounds: Normal breath sounds. No wheezing or rales.  Abdominal:     Palpations: Abdomen is soft.  Musculoskeletal:     Cervical back: Neck supple.  Skin:    General: Skin is warm.     Findings: No rash.  Neurological:     Mental Status: She is alert and oriented to person, place, and time.  Psychiatric:        Behavior: Behavior normal.  Previous notes and tests were reviewed. The plan was reviewed with the patient/family, and all questions/concerned were addressed.  It was my pleasure to see Rhonda Bush today and participate in her care. Please feel free to contact me with any questions or concerns.  Sincerely,  Rexene Alberts, DO Allergy & Immunology  [facility] office: [place] office: [phone]

## 2020-10-12 NOTE — Patient Instructions (Addendum)
Asthma:  Daily controller medication(s): continue Breo 1103mg 1 puff once a day and rinse mouth after each use. Take 2 puffs albuterol when you wake up from coughing and see if it helps. If coughing does not improve with albuterol then may need to go on a reflux/heartburn medication.  May use albuterol rescue inhaler 2 puffs every 4 to 6 hours as needed for shortness of breath, chest tightness, coughing, and wheezing. May use albuterol rescue inhaler 2 puffs 5 to 15 minutes prior to strenuous physical activities. Monitor frequency of use.  Asthma control goals:  Full participation in all desired activities (may need albuterol before activity) Albuterol use two times or less a week on average (not counting use with activity) Cough interfering with sleep two times or less a month Oral steroids no more than once a year No hospitalizations   Heartburn: See below for lifestyle and dietary modifications.  Follow up in 3 months or sooner if needed.

## 2020-10-26 DIAGNOSIS — R159 Full incontinence of feces: Secondary | ICD-10-CM | POA: Diagnosis not present

## 2020-10-26 DIAGNOSIS — I10 Essential (primary) hypertension: Secondary | ICD-10-CM | POA: Diagnosis not present

## 2020-10-26 DIAGNOSIS — R600 Localized edema: Secondary | ICD-10-CM | POA: Diagnosis not present

## 2020-10-26 DIAGNOSIS — R69 Illness, unspecified: Secondary | ICD-10-CM | POA: Diagnosis not present

## 2020-11-02 DIAGNOSIS — H04123 Dry eye syndrome of bilateral lacrimal glands: Secondary | ICD-10-CM | POA: Diagnosis not present

## 2020-11-02 DIAGNOSIS — H402231 Chronic angle-closure glaucoma, bilateral, mild stage: Secondary | ICD-10-CM | POA: Diagnosis not present

## 2020-11-04 DIAGNOSIS — R159 Full incontinence of feces: Secondary | ICD-10-CM | POA: Diagnosis not present

## 2020-11-23 DIAGNOSIS — Z1211 Encounter for screening for malignant neoplasm of colon: Secondary | ICD-10-CM | POA: Diagnosis not present

## 2020-11-23 DIAGNOSIS — R159 Full incontinence of feces: Secondary | ICD-10-CM | POA: Diagnosis not present

## 2020-11-28 DIAGNOSIS — R159 Full incontinence of feces: Secondary | ICD-10-CM | POA: Diagnosis not present

## 2020-12-07 DIAGNOSIS — M0579 Rheumatoid arthritis with rheumatoid factor of multiple sites without organ or systems involvement: Secondary | ICD-10-CM | POA: Diagnosis not present

## 2020-12-07 DIAGNOSIS — M810 Age-related osteoporosis without current pathological fracture: Secondary | ICD-10-CM | POA: Diagnosis not present

## 2020-12-07 DIAGNOSIS — Z79899 Other long term (current) drug therapy: Secondary | ICD-10-CM | POA: Diagnosis not present

## 2020-12-07 DIAGNOSIS — D72819 Decreased white blood cell count, unspecified: Secondary | ICD-10-CM | POA: Diagnosis not present

## 2020-12-12 DIAGNOSIS — F419 Anxiety disorder, unspecified: Secondary | ICD-10-CM | POA: Diagnosis not present

## 2020-12-12 DIAGNOSIS — M810 Age-related osteoporosis without current pathological fracture: Secondary | ICD-10-CM | POA: Diagnosis not present

## 2020-12-12 DIAGNOSIS — Z7951 Long term (current) use of inhaled steroids: Secondary | ICD-10-CM | POA: Diagnosis not present

## 2020-12-12 DIAGNOSIS — Z6831 Body mass index (BMI) 31.0-31.9, adult: Secondary | ICD-10-CM | POA: Diagnosis not present

## 2020-12-12 DIAGNOSIS — E669 Obesity, unspecified: Secondary | ICD-10-CM | POA: Diagnosis not present

## 2020-12-12 DIAGNOSIS — H409 Unspecified glaucoma: Secondary | ICD-10-CM | POA: Diagnosis not present

## 2020-12-12 DIAGNOSIS — M1990 Unspecified osteoarthritis, unspecified site: Secondary | ICD-10-CM | POA: Diagnosis not present

## 2020-12-12 DIAGNOSIS — F324 Major depressive disorder, single episode, in partial remission: Secondary | ICD-10-CM | POA: Diagnosis not present

## 2020-12-12 DIAGNOSIS — Z008 Encounter for other general examination: Secondary | ICD-10-CM | POA: Diagnosis not present

## 2020-12-12 DIAGNOSIS — Z7983 Long term (current) use of bisphosphonates: Secondary | ICD-10-CM | POA: Diagnosis not present

## 2020-12-12 DIAGNOSIS — R69 Illness, unspecified: Secondary | ICD-10-CM | POA: Diagnosis not present

## 2020-12-12 DIAGNOSIS — I10 Essential (primary) hypertension: Secondary | ICD-10-CM | POA: Diagnosis not present

## 2020-12-12 DIAGNOSIS — M069 Rheumatoid arthritis, unspecified: Secondary | ICD-10-CM | POA: Diagnosis not present

## 2020-12-12 DIAGNOSIS — J45909 Unspecified asthma, uncomplicated: Secondary | ICD-10-CM | POA: Diagnosis not present

## 2020-12-15 DIAGNOSIS — R159 Full incontinence of feces: Secondary | ICD-10-CM | POA: Diagnosis not present

## 2021-01-11 ENCOUNTER — Ambulatory Visit
Admission: RE | Admit: 2021-01-11 | Discharge: 2021-01-11 | Disposition: A | Discharge status: Home / Self Care | Payer: Medicare HMO | Source: Ambulatory Visit | Attending: Family Medicine | Admitting: Family Medicine

## 2021-01-11 DIAGNOSIS — Z1231 Encounter for screening mammogram for malignant neoplasm of breast: Secondary | ICD-10-CM | POA: Diagnosis not present

## 2021-01-18 ENCOUNTER — Ambulatory Visit: Payer: Medicare HMO | Admitting: Allergy

## 2021-01-18 ENCOUNTER — Encounter: Payer: Self-pay | Admitting: Allergy

## 2021-01-18 ENCOUNTER — Other Ambulatory Visit: Payer: Self-pay

## 2021-01-18 VITALS — BP 120/90 | HR 71 | Temp 98.3°F | Resp 15

## 2021-01-18 DIAGNOSIS — J4540 Moderate persistent asthma, uncomplicated: Secondary | ICD-10-CM | POA: Diagnosis not present

## 2021-01-18 MED ORDER — FLUTICASONE FUROATE-VILANTEROL 100-25 MCG/ACT IN AEPB: 1.0000 | INHALATION_SPRAY | Freq: Every day | RESPIRATORY_TRACT | 1 refills | Status: DC

## 2021-01-18 NOTE — Patient Instructions (Addendum)
Asthma:  Daily controller medication(s): continue Breo 116mcg 1 puff once a day and rinse mouth after each use - sent in 3 month supply prescription.  Check the pricing for the following inhalers: Dulera Advair HFA Advair Diskus Wixela Airduo Respiclick Airduo [employer]  May use albuterol rescue inhaler 2 puffs every 4 to 6 hours as needed for shortness of breath, chest tightness, coughing, and wheezing. May use albuterol rescue inhaler 2 puffs 5 to 15 minutes prior to strenuous physical activities. Monitor frequency of use.  Asthma control goals:  Full participation in all desired activities (may need albuterol before activity) Albuterol use two times or less a week on average (not counting use with activity) Cough interfering with sleep two times or less a month Oral steroids no more than once a year No hospitalizations  Follow up in 4 months or sooner if needed.   Recommend getting the flu vaccine and the Covid-19 booster vaccine.

## 2021-01-18 NOTE — Progress Notes (Signed)
Follow Up Note  RE: Rhonda Bush MRN: [id number] DOB: 03/14/1951 Date of Office Visit: 01/18/2021  Referring provider: Donald Prose, MD Primary care provider: Donald Prose, MD  Chief Complaint: Asthma  History of Present Illness: I had the pleasure of seeing Rhonda Bush for a follow up visit at the [facility] on 01/18/2021. She is a 70 y.o. female, who is being followed for asthma and heartburn. Her previous allergy office visit was on 10/12/2020 with Dr. Maudie Mercury. Today is a regular follow up visit.  Moderate persistent asthma Nocturnal coughing has improved significantly.  Still taking Breo 131mcg 1 puff once a day with good benefit. Her new insurance won't be covering it next year though.  Using albuterol 2-3 times per week for coughing with good benefit.  Denies any reflux symptoms.  Patient is hesitant about getting the flu vaccine - offered today but she states she gets it free at [facility].   Assessment and Plan: Rhonda Bush is a 70 y.o. female with: Moderate persistent asthma without complication Past history - Patient was apparently diagnosed with asthma over 10 years ago but worse in the last 2 months.  She used to follow in our office many years ago. Patient has not been using any type of inhalers but having daily symptoms. 2020 spirometry shows some restriction with minimal improvement in FEV1 post bronchodilator treatment.  Patient felt clinically better. Covid-19 in 2021. Interim history - uses albuterol 2-3 times per week for coughing with good benefit. Not waking up at night as much with the coughing. Insurance won't cover Breo next year.  Today's spirometry was normal. Daily controller medication(s): continue Breo 142mcg 1 puff once a day and rinse mouth after each use - sent in 3 month supply prescription. Check the pricing for the following inhalers: Dulera Advair HFA Advair Diskus Wixela Airduo Respiclick Airduo [employer] May use albuterol  rescue inhaler 2 puffs every 4 to 6 hours as needed for shortness of breath, chest tightness, coughing, and wheezing. May use albuterol rescue inhaler 2 puffs 5 to 15 minutes prior to strenuous physical activities. Monitor frequency of use.  Get spirometry at next visit.  Return in about 4 months (around 05/18/2021).  Meds ordered this encounter  Medications   fluticasone furoate-vilanterol (BREO ELLIPTA) 100-25 MCG/ACT AEPB    Sig: Inhale 1 puff into the lungs daily. Rinse mouth after each use.    Dispense:  180 each    Refill:  1    Lab Orders  No laboratory test(s) ordered today    Diagnostics: Spirometry:  Tracings reviewed. Her effort: Good reproducible efforts. FVC: 1.70L FEV1: 1.42L, 102% predicted FEV1/FVC ratio: 84% Interpretation: Spirometry consistent with normal pattern.  Please see scanned spirometry results for details.  Medication List:  Current Outpatient Medications  Medication Sig Dispense Refill   albuterol (VENTOLIN HFA) 108 (90 Base) MCG/ACT inhaler Inhale 2 puffs into the lungs every 4 (four) hours as needed for wheezing or shortness of breath (coughing fits). 18 g 1   alendronate (FOSAMAX) 70 MG tablet Take 70 mg by mouth once a week.     Alum Hydroxide-Mag Trisilicate (GAVISCON) 81-27.5 MG CHEW 2 tablets after meals and at bedtime as needed     amLODipine (NORVASC) 5 MG tablet Take 5 mg by mouth daily after breakfast.      aspirin 81 MG tablet Take 81 mg by mouth daily after breakfast.     cholecalciferol (VITAMIN D3) 25 MCG (1000 UT) tablet Take  1,000 Units by mouth daily after breakfast.      cyanocobalamin 1000 MCG tablet Take by mouth.     dorzolamide (TRUSOPT) 2 % ophthalmic solution Place 1 drop into both eyes 2 (two) times daily.     FLUoxetine (PROZAC) 20 MG tablet Take 60 mg by mouth daily.     FLUoxetine HCl 60 MG TABS Take 60 mg by mouth daily after breakfast.      fluticasone furoate-vilanterol (BREO ELLIPTA) 100-25 MCG/ACT AEPB Inhale 1 puff  into the lungs daily. Rinse mouth after each use. 502 each 1   folic acid (FOLVITE) 1 MG tablet Take 1 mg by mouth daily after breakfast.      furosemide (LASIX) 20 MG tablet Take 20 mg by mouth daily. As needed     hydrochlorothiazide (HYDRODIURIL) 25 MG tablet      hydroxychloroquine (PLAQUENIL) 200 MG tablet See admin instructions.     ibandronate (BONIVA) 150 MG tablet Take 150 mg by mouth every 30 (thirty) days.     latanoprost (XALATAN) 0.005 % ophthalmic solution Place 1 drop into both eyes at bedtime.     methotrexate (RHEUMATREX) 2.5 MG tablet Take by mouth.     metoprolol succinate (TOPROL-XL) 100 MG 24 hr tablet Take 100 mg by mouth daily after breakfast. Take with or immediately following a meal.     Oyster Shell Calcium 500 MG TABS 1 tablet with meals     SUMAtriptan (IMITREX) 100 MG tablet Take 100 mg by mouth every 2 (two) hours as needed for migraine. May repeat in 2 hours if headache persists or recurs.     telmisartan (MICARDIS) 80 MG tablet Take 80 mg by mouth daily after breakfast.     Vitamin D-Vitamin K (VITAMIN K2-VITAMIN D3) 45-2000 MCG-UNIT CAPS 1 tablet     methotrexate (RHEUMATREX) 2.5 MG tablet Take 12.5 mg by mouth every Monday. (Patient not taking: Reported on 01/18/2021)     No current facility-administered medications for this visit.   Allergies: Allergies  Allergen Reactions   Eggs Or Egg-Derived Products Nausea And Vomiting and Other (See Comments)   Influenza Vaccines Nausea And Vomiting and Other (See Comments)   Other Nausea And Vomiting and Other (See Comments)    Egg Flu vaccine if it has egg derivative in it   I reviewed her past medical history, social history, family history, and environmental history and no significant changes have been reported from her previous visit.  Review of Systems  Constitutional:  Negative for appetite change, chills, fever and unexpected weight change.  HENT:  Negative for congestion and rhinorrhea.   Eyes:  Negative  for itching.  Respiratory:  Positive for cough. Negative for chest tightness, shortness of breath and wheezing.   Cardiovascular:  Negative for chest pain.  Gastrointestinal:  Negative for abdominal pain.  Genitourinary:  Negative for difficulty urinating.  Skin:  Negative for rash.  Allergic/Immunologic: Negative for environmental allergies and food allergies.  Neurological:  Negative for headaches.   Objective: BP 120/90   Pulse 71   Temp 98.3 F (36.8 C) (Temporal)   Resp 15   SpO2 97%  There is no height or weight on file to calculate BMI. Physical Exam Vitals and nursing note reviewed.  Constitutional:      Appearance: She is well-developed.  HENT:     Head: Normocephalic and atraumatic.     Right Ear: External ear normal.     Left Ear: External ear normal.     Nose:  Nose normal.  Eyes:     Conjunctiva/sclera: Conjunctivae normal.  Cardiovascular:     Rate and Rhythm: Normal rate and regular rhythm.     Heart sounds: Normal heart sounds. No murmur heard.   No friction rub. No gallop.  Pulmonary:     Effort: Pulmonary effort is normal.     Breath sounds: Normal breath sounds. No wheezing or rales.  Abdominal:     Palpations: Abdomen is soft.  Musculoskeletal:     Cervical back: Neck supple.  Skin:    General: Skin is warm.     Findings: No rash.  Neurological:     Mental Status: She is alert and oriented to person, place, and time.  Psychiatric:        Behavior: Behavior normal.   Previous notes and tests were reviewed. The plan was reviewed with the patient/family, and all questions/concerned were addressed.  It was my pleasure to see Rhonda Bush today and participate in her care. Please feel free to contact me with any questions or concerns.  Sincerely,  Rexene Alberts, DO Allergy & Immunology  [facility] office: [place] office: [phone]

## 2021-01-18 NOTE — Assessment & Plan Note (Signed)
Past history - Patient was apparently diagnosed with asthma over 10 years ago but worse in the last 2 months.  She used to follow in our office many years ago. Patient has not been using any type of inhalers but having daily symptoms. 2020 spirometry shows some restriction with minimal improvement in FEV1 post bronchodilator treatment.  Patient felt clinically better. Covid-19 in 2021. Interim history - uses albuterol 2-3 times per week for coughing with good benefit. Not waking up at night as much with the coughing. Insurance won't cover Breo next year.   Today's spirometry was normal.  Daily controller medication(s): continue Breo 164mcg 1 puff once a day and rinse mouth after each use - sent in 3 month supply prescription. Check the pricing for the following inhalers: Dulera Advair HFA Advair Diskus Wixela Airduo Respiclick Airduo [employer]  May use albuterol rescue inhaler 2 puffs every 4 to 6 hours as needed for shortness of breath, chest tightness, coughing, and wheezing. May use albuterol rescue inhaler 2 puffs 5 to 15 minutes prior to strenuous physical activities. Monitor frequency of use.   Get spirometry at next visit.

## 2021-03-08 IMAGING — NM NUCLEAR MEDICINE PARATHYROID WITH SPEC
3 series · 18 of 18 positions shown · non-contrast
Comparison: August 24, 2018

CLINICAL DATA: Parathyroid hyperplasia.

EXAM:
NM PARATHYROID SCINTIGRAPHY AND SPECT IMAGING
TECHNIQUE: Following intravenous administration of radiopharmaceutical, early
and 2-hour delayed planar images were obtained in the anterior
projection. Delayed triplanar SPECT images were also obtained at 2
hours.
RADIOPHARMACEUTICALS:  20.0 mCi Oc-CCm Sestamibi IV

[Series 1: spect - (id)_(id)_tra · 4.1mm · 4.14mm/px · 6 of 128 frames shown]
[frame 11/128]
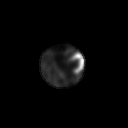
[frame 32/128]
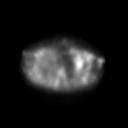
[frame 54/128]
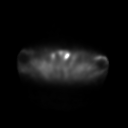
[frame 75/128]
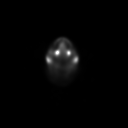
[frame 96/128]
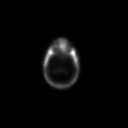
[frame 118/128]
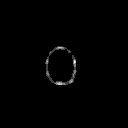

[Series 1: spect - (id)_(id)_cor · 4.1mm · 4.14mm/px · 6 of 128 frames shown]
[frame 11/128]
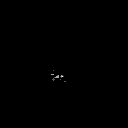
[frame 32/128]
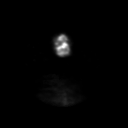
[frame 54/128]
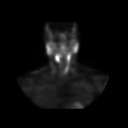
[frame 75/128]
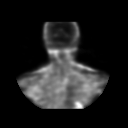
[frame 96/128]
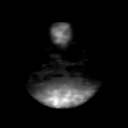
[frame 118/128]
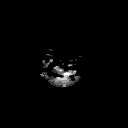

[Series 3: spect parathyroid · 4.14mm/px · 6 of 64 frames shown]
[frame 6/64]
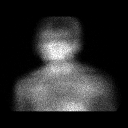
[frame 16/64]
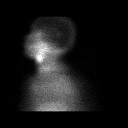
[frame 27/64]
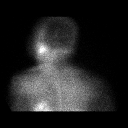
[frame 38/64]
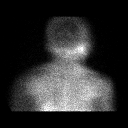
[frame 48/64]
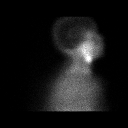
[frame 59/64]
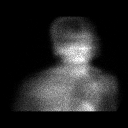

[18 of 18 positions shown; findings below may reference images not displayed]

FINDINGS: Initial 15 minutes images demonstrate slightly more uptake on the
left than the right. Delayed images demonstrate significant uptake
in the region of the left thyroid bed which correlates with the soft
tissue mass posterior to left thyroid lobe on the ultrasound from
earlier today.
IMPRESSION: Uptake in the left thyroid bed on delayed images correlates with the
soft tissue behind the left thyroid lobe on today's ultrasound.
Given history, the findings are most consistent with a large
left-sided parathyroid adenoma.

## 2021-04-01 DIAGNOSIS — H402231 Chronic angle-closure glaucoma, bilateral, mild stage: Secondary | ICD-10-CM | POA: Diagnosis not present

## 2021-04-01 DIAGNOSIS — H25013 Cortical age-related cataract, bilateral: Secondary | ICD-10-CM | POA: Diagnosis not present

## 2021-04-01 DIAGNOSIS — H35033 Hypertensive retinopathy, bilateral: Secondary | ICD-10-CM | POA: Diagnosis not present

## 2021-04-01 DIAGNOSIS — H2513 Age-related nuclear cataract, bilateral: Secondary | ICD-10-CM | POA: Diagnosis not present

## 2021-04-08 DIAGNOSIS — D72819 Decreased white blood cell count, unspecified: Secondary | ICD-10-CM | POA: Diagnosis not present

## 2021-04-08 DIAGNOSIS — M810 Age-related osteoporosis without current pathological fracture: Secondary | ICD-10-CM | POA: Diagnosis not present

## 2021-04-08 DIAGNOSIS — Z79899 Other long term (current) drug therapy: Secondary | ICD-10-CM | POA: Diagnosis not present

## 2021-04-08 DIAGNOSIS — M0579 Rheumatoid arthritis with rheumatoid factor of multiple sites without organ or systems involvement: Secondary | ICD-10-CM | POA: Diagnosis not present

## 2021-04-20 ENCOUNTER — Encounter: Payer: Self-pay | Admitting: Internal Medicine

## 2021-04-20 ENCOUNTER — Ambulatory Visit: Payer: Medicare HMO | Admitting: Internal Medicine

## 2021-04-20 ENCOUNTER — Other Ambulatory Visit: Payer: Self-pay

## 2021-04-20 VITALS — BP 110/70 | HR 57 | Temp 98.2°F | Ht 58.5 in | Wt 148.0 lb

## 2021-04-20 DIAGNOSIS — J4540 Moderate persistent asthma, uncomplicated: Secondary | ICD-10-CM | POA: Diagnosis not present

## 2021-04-20 MED ORDER — ALBUTEROL SULFATE HFA 108 (90 BASE) MCG/ACT IN AERS: 2.0000 | INHALATION_SPRAY | RESPIRATORY_TRACT | 3 refills | Status: AC | PRN

## 2021-04-20 MED ORDER — ADVAIR HFA 115-21 MCG/ACT IN AERO
2.0000 | INHALATION_SPRAY | Freq: Two times a day (BID) | RESPIRATORY_TRACT | 5 refills | Status: DC
Start: 2021-04-20 — End: 2021-07-23

## 2021-04-20 NOTE — Progress Notes (Signed)
Rhonda Bush    [id number]    1951/01/02  Primary Care Physician:Sun, Gari Crown, MD  Referring Physician: Donald Prose, MD [address] [place],  [place] [address] Reason for Consultation: shortness of breath Date of Consultation: 04/20/2021  Chief complaint:   Chief Complaint  Patient presents with   Consult    She reports she was diagnosed more than 10 years ago with Asthma.      HPI:  Rhonda Bush is a 71 y.o. woman who presents for new patient evaluation for asthma. Past medical history of rheumatoid arthritis Was diagnosed about 10 years ago.  Having worsening symptoms over the last couple of months shortness of breath, chest tightness, wheezing, waking her up at night. Thinks she might have had symptoms as a child as well.   She has been off Breo since December due to insurance coverage. Since then has been taking the rescue inhaler 1-2 times/week.    Current Regimen: Breo 2 puff once a day, and albuterol prn Asthma Triggers: exertion, stress/anxious ,strong smells and perfumes Exacerbations in the last year: none History of hospitalization or intubation: none Allergy Testing: SPT reviewed in 2020 - wnl GERD:  yes, does not take anything for it.  Allergic Rhinitis: denies ACT:  Asthma Control Test ACT Total Score  04/20/2021 18  01/18/2021 20  10/12/2020 23   FeNO:  RA - on mtx and plaquenil. Hands and feet, well controlled currently.    Social history:  Occupation:  retired, worked in Energy manager, she was more in a Librarian, academic role Exposures: independently, no pets Smoking history: never smoker, passive smoke exposure in childhood from parent and sibling  Social History   Occupational History   Occupation: retired Advice worker  Tobacco Use   Smoking status: Never   Smokeless tobacco: Never  Scientific laboratory technician Use: Never used  Substance and Sexual Activity   Alcohol use: Yes     Comment: very rare   Drug use: No   Sexual activity: Not Currently    Relevant family history:  Family History  Problem Relation Age of Onset   Heart disease Mother    Heart attack Father    Asthma Neg Hx     Past Medical History:  Diagnosis Date   Arthritis    rheumatoid arthritis -mild(feet,ankles,hand)-not bothersome now   Asthma    mild- not routine use of meds or inhalers   BURSITIS, LEFT SHOULDER 12/18/2006   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/2009   has improved   DEPRESSION 09/25/2006   GLAUCOMA NOS 09/25/2006   History of hiatal hernia    HYPERTENSION 09/25/2006   MIGRAINE HEADACHE 10/14/2006   OSTEOPOROSIS NOS 10/16/2006   PELVIC REGION, PAIN 12/17/2009   RESTLESS LEG SYNDROME, SEVERE 10/16/2006   not bothered now   SYNDROME, PREMENSTRUAL TENSION 10/14/2006    Past Surgical History:  Procedure Laterality Date   ABDOMINAL HYSTERECTOMY  1992 ,  [place] in 1994   [place]-fibro[place]    BREAST BIOPSY Right    ESOPHAGOGASTRODUODENOSCOPY (EGD) WITH PROPOFOL N/A 02/13/2013   Procedure: ESOPHAGOGASTRODUODENOSCOPY (EGD) WITH PROPOFOL;  Surgeon: Arta Silence, MD;  Location: [facility];  Service: Endoscopy;  Laterality: N/A;   EUS N/A 02/13/2013   Procedure: ESOPHAGEAL ENDOSCOPIC ULTRASOUND (EUS) RADIAL;  Surgeon: Arta Silence, MD;  Location: [facility];  Service: Endoscopy;  Laterality: N/A;   PARATHYROIDECTOMY N/A 10/18/2018   Procedure:  PARATHYROIDECTOMY;  Surgeon: Armandina Gemma, MD;  Location: [facility];  Service: General;  Laterality: N/A;   PARATHYROIDECTOMY N/A 10/23/2018   Procedure: NECK EXPLORATION, EVACUATION OF HEMATOMA;  Surgeon: Armandina Gemma, MD;  Location: [facility];  Service: General;  Laterality: N/A;     Physical Exam: Blood pressure 110/70, pulse (!) 57, temperature 98.2 F (36.8 C), temperature source Oral, height 4' 10.5" (1.486 m), weight 148 lb (67.1 kg), SpO2 97 %. Gen:      No acute distress ENT:  mallampti III no nasal polyps, mucus membranes  moist Lungs:    No increased respiratory effort, symmetric chest wall excursion, clear to auscultation bilaterally, no wheezes or crackles CV:         Regular rate and rhythm; no murmurs, rubs, or gallops.  No pedal edema Abd:      + bowel sounds; soft, non-tender; no distension MSK: no acute synovitis of DIP or PIP joints, no mechanics hands.  Skin:      Warm and dry; no rashes Neuro: normal speech, no focal facial asymmetry Psych: alert and oriented x3, normal mood and affect   Data Reviewed/Medical Decision Making:  Independent interpretation of tests: Imaging:   PFTs: I have personally reviewed the patient's PFTs and spirometry August 2022 done at allergy office was wnl. No airflow limitation No flowsheet data found.  Labs:  Lab Results  Component Value Date   WBC 5.0 10/24/2018   HGB 11.8 (L) 10/24/2018   HCT 38.8 10/24/2018   MCV 94.4 10/24/2018   PLT 285 10/24/2018   Lab Results  Component Value Date   NA 138 10/24/2018   K 4.2 10/24/2018   CL 107 10/24/2018   CO2 22 10/24/2018      Immunization status:  Immunization History  Administered Date(s) Administered   PFIZER(Purple Top)SARS-COV-2 Vaccination 04/05/2019, 04/26/2019, 12/13/2019, 07/31/2020, 12/23/2020   Td 03/14/1998, 11/12/2008     I reviewed prior external note(s) from allergy and asthma  I reviewed the result(s) of the labs and imaging as noted above.   I have ordered   Assessment:  Moderate persistent asthma, not well controlled   Plan/Recommendations:  Will need to resume ICS-LABA. Start Advair low dose 2 puffs twice a day. Refilled albuterol.   We discussed disease management and progression at length today for asthma.   Return to Care: Return in about 3 months (around 07/18/2021).  Lenice Llamas, MD [facility] [place]  CC: Donald Prose, MD

## 2021-04-20 NOTE — Patient Instructions (Addendum)
Please schedule follow up scheduled with myself in 3 months.  If my schedule is not open yet, we will contact you with a reminder closer to that time. Please call [phone] if you haven't heard from [place] a month before.   Sign up for mychart  Take advair 2 puffs in the morning, 2 puffs at night. Gargle after use.  Take the albuterol rescue inhaler every 4 to 6 hours as needed for wheezing or shortness of breath. You can also take it 15 minutes before exercise or exertional activity. Side effects include heart racing or pounding, jitters or anxiety. If you have a history of an irregular heart rhythm, it can make this worse. Can also give some patients a hard time sleeping.  By learning about asthma and how it can be controlled, you take an important step toward managing this disease. Work closely with your asthma care team to learn all you can about your asthma, how to avoid triggers, what your medications do, and how to take them correctly. With proper care, you can live free of asthma symptoms and maintain a normal, healthy lifestyle.   What is asthma? Asthma is a chronic disease that affects the airways of the lungs. During normal breathing, the bands of muscle that surround the airways are relaxed and air moves freely. During an asthma episode or "attack," there are three main changes that stop air from moving easily through the airways: The bands of muscle that surround the airways tighten and make the airways narrow. This tightening is called bronchospasm.  The lining of the airways becomes swollen or inflamed.  The cells that line the airways produce more mucus, which is thicker than normal and clogs the airways.  These three factors - bronchospasm, inflammation, and mucus production - cause symptoms such as difficulty breathing, wheezing, and coughing.  What are the most common symptoms of asthma? Asthma symptoms are not the same for everyone. They can even change from episode to  episode in the same person. Also, you may have only one symptom of asthma, such as cough, but another person may have all the symptoms of asthma. It is important to know all the symptoms of asthma and to be aware that your asthma can present in any of these ways at any time. The most common symptoms include: Coughing, especially at night  Shortness of breath  Wheezing  Chest tightness, pain, or pressure   Who is affected by asthma? Asthma affects 22 million Americans; about 6 million of these are children under age 66. People who have a family history of asthma have an increased risk of developing the disease. Asthma is also more common in people who have allergies or who are exposed to tobacco smoke. However, anyone can develop asthma at any time. Some people may have asthma all of their lives, while others may develop it as adults.  What causes asthma? The airways in a person with asthma are very sensitive and react to many things, or "triggers." Contact with these triggers causes asthma symptoms. One of the most important parts of asthma control is to identify your triggers and then avoid them when possible. The only trigger you do not want to avoid is exercise. Pre-treatment with medicines before exercise can allow you to stay active yet avoid asthma symptoms. Common asthma triggers include: Infections (colds, viruses, flu, sinus infections)  Exercise  Weather (changes in temperature and/or humidity, cold air)  Tobacco smoke  Allergens (dust mites, pollens, pets, mold spores,  cockroaches, and sometimes foods)  Irritants (strong odors from cleaning products, perfume, wood smoke, air pollution)  Strong emotions such as crying or laughing hard  Some medications   How is asthma diagnosed? To diagnose asthma, your doctor will first review your medical history, family history, and symptoms. Your doctor will want to know any past history of breathing problems you may have had, as well as a family  history of asthma, allergies, eczema (a bumpy, itchy skin rash caused by allergies), or other lung disease. It is important that you describe your symptoms in detail (cough, wheeze, shortness of breath, chest tightness), including when and how often they occur. The doctor will perform a physical examination and listen to your heart and lungs. He or she may also order breathing tests, allergy tests, blood tests, and chest and sinus X-rays. The tests will find out if you do have asthma and if there are any other conditions that are contributing factors.  How is asthma treated? Asthma can be controlled, but not cured. It is not normal to have frequent symptoms, trouble sleeping, or trouble completing tasks. Appropriate asthma care will prevent symptoms and visits to the emergency room and hospital. Asthma medicines are one of the mainstays of asthma treatment. The drugs used to treat asthma are explained below.  Anti-inflammatories: These are the most important drugs for most people with asthma. Anti-inflammatory drugs reduce swelling and mucus production in the airways. As a result, airways are less sensitive and less likely to react to triggers. These medications need to be taken daily and may need to be taken for several weeks before they begin to control asthma. Anti-inflammatory medicines lead to fewer symptoms, better airflow, less sensitive airways, less airway damage, and fewer asthma attacks. If taken every day, they CONTROL or prevent asthma symptoms.   Bronchodilators: These drugs relax the muscle bands that tighten around the airways. This action opens the airways, letting more air in and out of the lungs and improving breathing. Bronchodilators also help clear mucus from the lungs. As the airways open, the mucus moves more freely and can be coughed out more easily. In short-acting forms, bronchodilators RELIEVE or stop asthma symptoms by quickly opening the airways and are very helpful during an  asthma episode. In long-acting forms, bronchodilators provide CONTROL of asthma symptoms and prevent asthma episodes.  Asthma drugs can be taken in a variety of ways. Inhaling the medications by using a metered dose inhaler, dry powder inhaler, or nebulizer is one way of taking asthma medicines. Oral medicines (pills or liquids you swallow) may also be prescribed.  Asthma severity Asthma is classified as either "intermittent" (comes and goes) or "persistent" (lasting). Persistent asthma is further described as being mild, moderate, or severe. The severity of asthma is based on how often you have symptoms both during the day and night, as well as by the results of lung function tests and by how well you can perform activities. The "severity" of asthma refers to how "intense" or "strong" your asthma is.  Asthma control Asthma control is the goal of asthma treatment. Regardless of your asthma severity, it may or may not be controlled. Asthma control means: You are able to do everything you want to do at work and home  You have no (or minimal) asthma symptoms  You do not wake up from your sleep or earlier than usual in the morning due to asthma  You rarely need to use your reliever medicine (inhaler)  Another major part  of your treatment is that you are happy with your asthma care and believe your asthma is controlled.  Monitoring symptoms A key part of treatment is keeping track of how well your lungs are working. Monitoring your symptoms  what they are, how and when they happen, and how severe they are  is an important part of being able to control your asthma.  Sometimes asthma is monitored using a peak flow meter. A peak flow (PF) meter measures how fast the air comes out of your lungs. It can help you know when your asthma is getting worse, sometimes even before you have symptoms. By taking daily peak flow readings, you can learn when to adjust medications to keep asthma under good control. It  is also used to create your asthma action plan (see below). Your doctor can use your peak flow readings to adjust your treatment plan in some cases.  Asthma Action Plan Based on your history and asthma severity, you and your doctor will develop a care plan called an asthma action plan. The asthma action plan describes when and how to use your medicines, actions to take when asthma worsens, and when to seek emergency care. Make sure you understand this plan. If you do not, ask your asthma care provider any questions you may have. Your asthma action plan is one of the keys to controlling asthma. Keep it readily available to remind you of what you need to do every day to control asthma and what you need to do when symptoms occur.  Goals of asthma therapy These are the goals of asthma treatment: Live an active, normal life  Prevent chronic and troublesome symptoms  Attend work or school every day  Perform daily activities without difficulty  Stop urgent visits to the doctor, emergency department, or hospital  Use and adjust medications to control asthma with few or no side effects

## 2021-05-05 ENCOUNTER — Institutional Professional Consult (permissible substitution): Payer: Medicare HMO | Admitting: Internal Medicine

## 2021-05-10 DIAGNOSIS — F330 Major depressive disorder, recurrent, mild: Secondary | ICD-10-CM | POA: Diagnosis not present

## 2021-05-10 DIAGNOSIS — H409 Unspecified glaucoma: Secondary | ICD-10-CM | POA: Diagnosis not present

## 2021-05-10 DIAGNOSIS — M810 Age-related osteoporosis without current pathological fracture: Secondary | ICD-10-CM | POA: Diagnosis not present

## 2021-05-10 DIAGNOSIS — R600 Localized edema: Secondary | ICD-10-CM | POA: Diagnosis not present

## 2021-05-10 DIAGNOSIS — Z0000 Encounter for general adult medical examination without abnormal findings: Secondary | ICD-10-CM | POA: Diagnosis not present

## 2021-05-10 DIAGNOSIS — M069 Rheumatoid arthritis, unspecified: Secondary | ICD-10-CM | POA: Diagnosis not present

## 2021-05-10 DIAGNOSIS — I10 Essential (primary) hypertension: Secondary | ICD-10-CM | POA: Diagnosis not present

## 2021-05-10 DIAGNOSIS — E210 Primary hyperparathyroidism: Secondary | ICD-10-CM | POA: Diagnosis not present

## 2021-05-10 DIAGNOSIS — J4520 Mild intermittent asthma, uncomplicated: Secondary | ICD-10-CM | POA: Diagnosis not present

## 2021-06-02 ENCOUNTER — Ambulatory Visit: Payer: Medicare HMO | Admitting: Allergy

## 2021-06-07 ENCOUNTER — Ambulatory Visit: Payer: Medicare HMO | Admitting: Allergy

## 2021-07-08 DIAGNOSIS — M810 Age-related osteoporosis without current pathological fracture: Secondary | ICD-10-CM | POA: Diagnosis not present

## 2021-07-08 DIAGNOSIS — M0579 Rheumatoid arthritis with rheumatoid factor of multiple sites without organ or systems involvement: Secondary | ICD-10-CM | POA: Diagnosis not present

## 2021-07-08 DIAGNOSIS — Z79899 Other long term (current) drug therapy: Secondary | ICD-10-CM | POA: Diagnosis not present

## 2021-07-08 DIAGNOSIS — D72819 Decreased white blood cell count, unspecified: Secondary | ICD-10-CM | POA: Diagnosis not present

## 2021-07-16 DIAGNOSIS — E210 Primary hyperparathyroidism: Secondary | ICD-10-CM | POA: Diagnosis not present

## 2021-07-16 DIAGNOSIS — M810 Age-related osteoporosis without current pathological fracture: Secondary | ICD-10-CM | POA: Diagnosis not present

## 2021-07-16 DIAGNOSIS — E559 Vitamin D deficiency, unspecified: Secondary | ICD-10-CM | POA: Diagnosis not present

## 2021-07-20 ENCOUNTER — Ambulatory Visit: Payer: Medicare HMO | Admitting: Internal Medicine

## 2021-07-23 ENCOUNTER — Encounter: Payer: Self-pay | Admitting: Internal Medicine

## 2021-07-23 ENCOUNTER — Ambulatory Visit: Payer: Medicare HMO | Admitting: Internal Medicine

## 2021-07-23 VITALS — BP 110/70 | HR 74 | Temp 98.3°F | Ht 58.5 in | Wt 151.7 lb

## 2021-07-23 DIAGNOSIS — E210 Primary hyperparathyroidism: Secondary | ICD-10-CM | POA: Diagnosis not present

## 2021-07-23 DIAGNOSIS — J4540 Moderate persistent asthma, uncomplicated: Secondary | ICD-10-CM

## 2021-07-23 DIAGNOSIS — E559 Vitamin D deficiency, unspecified: Secondary | ICD-10-CM | POA: Diagnosis not present

## 2021-07-23 DIAGNOSIS — M810 Age-related osteoporosis without current pathological fracture: Secondary | ICD-10-CM | POA: Diagnosis not present

## 2021-07-23 DIAGNOSIS — I10 Essential (primary) hypertension: Secondary | ICD-10-CM | POA: Diagnosis not present

## 2021-07-23 MED ORDER — FLUTICASONE-SALMETEROL 115-21 MCG/ACT IN AERO: 2.0000 | INHALATION_SPRAY | Freq: Two times a day (BID) | RESPIRATORY_TRACT | 11 refills | Status: DC

## 2021-07-23 NOTE — Patient Instructions (Signed)
Please schedule follow up scheduled with myself in 1 year.  If my schedule is not open yet, we will contact you with a reminder closer to that time. Please call [phone] if you haven't heard from [place] a month before.  ? ?Continue your advair 2 puffs twice a day. Gargle after use  ? ?Continue albuterol as needed.  ? ?If you have any issues or concerns with your breathing - please call in sooner! ?

## 2021-07-23 NOTE — Progress Notes (Signed)
? ?      ?Rhonda Bush    [id number]    1951/02/26 ? ?Primary Care Physician:Sun, Gari Crown, MD ?Date of Appointment: 07/23/2021 ?Established Patient Visit ? ?Chief complaint:   ?Chief Complaint  ?Patient presents with  ? Follow-up  ? ? ? ?HPI: ?Rhonda Bush is a 71 y.o. woman with moderate persistent asthma and rheumatoid arthritis on methorexate and plaquenil ? ?Interval Updates: ?Here for follow up after resuming advair.  ?Improved symptoms, taking albuterol twice a day.  ?Took some prednisone for her RA symptoms, a short taper. This didn't have a significant impact on her breathing.  ?Reports improved symptoms, no impairments in day to day activity.  ? ?Current Regimen: advair 115-21 2 puffs BID. and albuterol prn ?Asthma Triggers: exertion, stress/anxious ,strong smells and perfumes ?Exacerbations in the last year: none for asthma, but has taken for RA in the last year.  ?History of hospitalization or intubation: none ?Allergy Testing: SPT reviewed in 2020 - wnl ?GERD:  yes, does not take anything for it.  ?Allergic Rhinitis: denies ?ACT:  ?Asthma Control Test ACT Total Score  ?07/23/2021 ? 9:13 AM 20  ?04/20/2021 ?11:11 AM 18  ?01/18/2021 ?11:00 AM 20  ? ?FeNO: ? ?I have reviewed the patient's family social and past medical history and updated as appropriate.  ? ?Past Medical History:  ?Diagnosis Date  ? Arthritis   ? rheumatoid arthritis -mild(feet,ankles,hand)-not bothersome now  ? Asthma   ? mild- not routine use of meds or inhalers  ? BURSITIS, LEFT SHOULDER 12/18/2006  ? 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/2009  ? has improved  ? DEPRESSION 09/25/2006  ? GLAUCOMA NOS 09/25/2006  ? History of hiatal hernia   ? HYPERTENSION 09/25/2006  ? MIGRAINE HEADACHE 10/14/2006  ? OSTEOPOROSIS NOS 10/16/2006  ? PELVIC REGION, PAIN 12/17/2009  ? RESTLESS LEG SYNDROME, SEVERE 10/16/2006  ? not bothered now  ? SYNDROME, PREMENSTRUAL TENSION 10/14/2006  ? ? ?Past Surgical History:  ?Procedure Laterality Date  ? ABDOMINAL  HYSTERECTOMY  1992 ,  made total in 1994  ? tah - bso-fibroids   ? BREAST BIOPSY Right   ? ESOPHAGOGASTRODUODENOSCOPY (EGD) WITH PROPOFOL N/A 02/13/2013  ? Procedure: ESOPHAGOGASTRODUODENOSCOPY (EGD) WITH PROPOFOL;  Surgeon: Arta Silence, MD;  Location: [facility];  Service: Endoscopy;  Laterality: N/A;  ? EUS N/A 02/13/2013  ? Procedure: ESOPHAGEAL ENDOSCOPIC ULTRASOUND (EUS) RADIAL;  Surgeon: Arta Silence, MD;  Location: [facility];  Service: Endoscopy;  Laterality: N/A;  ? PARATHYROIDECTOMY N/A 10/18/2018  ? Procedure: PARATHYROIDECTOMY;  Surgeon: Armandina Gemma, MD;  Location: [facility];  Service: General;  Laterality: N/A;  ? PARATHYROIDECTOMY N/A 10/23/2018  ? Procedure: NECK EXPLORATION, EVACUATION OF HEMATOMA;  Surgeon: Armandina Gemma, MD;  Location: [facility];  Service: General;  Laterality: N/A;  ? ? ?Family History  ?Problem Relation Age of Onset  ? Heart disease Mother   ? Heart attack Father   ? Asthma Neg Hx   ? ? ?Social History  ? ?Occupational History  ? Occupation: retired Advice worker  ?Tobacco Use  ? Smoking status: Never  ? Smokeless tobacco: Never  ?Vaping Use  ? Vaping Use: Never used  ?Substance and Sexual Activity  ? Alcohol use: Yes  ?  Comment: very rare  ? Drug use: No  ? Sexual activity: Not Currently  ? ? ? ?Physical Exam: ?Blood pressure 110/70, pulse 74, temperature 98.3 ?F (36.8 ?C), temperature source Oral, height 4' 10.5" (1.486 m), weight 151 lb 10.4 oz (68.8 kg), SpO2  98 %. ? ?Gen:      No acute distress ?ENT:  no nasal polyps, mucus membranes moist ?Lungs:    No increased respiratory effort, symmetric chest wall excursion, clear to auscultation bilaterally, no wheezes or crackles ?CV:         Regular rate and rhythm; no murmurs, rubs, or gallops.  No pedal edema ? ?Data Reviewed: ?Imaging: ? ?PFTs: ? ? ?Labs: ? ?Immunization status: ?Immunization History  ?Administered Date(s) Administered  ? PFIZER(Purple Top)SARS-COV-2 Vaccination 04/05/2019, 04/26/2019, 12/13/2019, 07/31/2020,  12/23/2020  ? Td 03/14/1998, 11/12/2008  ? ? ?External Records Personally Reviewed:  ? ?Assessment:  ?Moderate Persistent Asthma, well controlled ? ?Plan/Recommendations: ?Continue advair, prn albuterol ? ?Return to Care: ?Return in about 1 year (around 07/24/2022). ? ? ?Lenice Llamas, MD ?Pulmonary and Critical Care Medicine ?[place] ?Office:[phone] ? ? ? ? ? ?

## 2021-08-06 DIAGNOSIS — I10 Essential (primary) hypertension: Secondary | ICD-10-CM | POA: Diagnosis not present

## 2021-08-06 DIAGNOSIS — R079 Chest pain, unspecified: Secondary | ICD-10-CM | POA: Diagnosis not present

## 2021-10-14 DIAGNOSIS — M0579 Rheumatoid arthritis with rheumatoid factor of multiple sites without organ or systems involvement: Secondary | ICD-10-CM | POA: Diagnosis not present

## 2021-10-14 DIAGNOSIS — D72819 Decreased white blood cell count, unspecified: Secondary | ICD-10-CM | POA: Diagnosis not present

## 2021-10-14 DIAGNOSIS — M810 Age-related osteoporosis without current pathological fracture: Secondary | ICD-10-CM | POA: Diagnosis not present

## 2021-10-14 DIAGNOSIS — Z79899 Other long term (current) drug therapy: Secondary | ICD-10-CM | POA: Diagnosis not present

## 2021-10-20 DIAGNOSIS — Z20822 Contact with and (suspected) exposure to covid-19: Secondary | ICD-10-CM | POA: Diagnosis not present

## 2021-10-21 DIAGNOSIS — Z20822 Contact with and (suspected) exposure to covid-19: Secondary | ICD-10-CM | POA: Diagnosis not present

## 2021-10-21 DIAGNOSIS — U071 COVID-19: Secondary | ICD-10-CM | POA: Diagnosis not present

## 2021-10-24 DIAGNOSIS — Z20822 Contact with and (suspected) exposure to covid-19: Secondary | ICD-10-CM | POA: Diagnosis not present

## 2021-10-25 DIAGNOSIS — Z20822 Contact with and (suspected) exposure to covid-19: Secondary | ICD-10-CM | POA: Diagnosis not present

## 2021-10-28 DIAGNOSIS — Z20822 Contact with and (suspected) exposure to covid-19: Secondary | ICD-10-CM | POA: Diagnosis not present

## 2021-10-29 DIAGNOSIS — Z20822 Contact with and (suspected) exposure to covid-19: Secondary | ICD-10-CM | POA: Diagnosis not present

## 2021-11-01 DIAGNOSIS — Z20822 Contact with and (suspected) exposure to covid-19: Secondary | ICD-10-CM | POA: Diagnosis not present

## 2021-11-02 DIAGNOSIS — Z20822 Contact with and (suspected) exposure to covid-19: Secondary | ICD-10-CM | POA: Diagnosis not present

## 2021-11-04 DIAGNOSIS — H402231 Chronic angle-closure glaucoma, bilateral, mild stage: Secondary | ICD-10-CM | POA: Diagnosis not present

## 2021-11-04 DIAGNOSIS — H04123 Dry eye syndrome of bilateral lacrimal glands: Secondary | ICD-10-CM | POA: Diagnosis not present

## 2021-11-04 DIAGNOSIS — H2513 Age-related nuclear cataract, bilateral: Secondary | ICD-10-CM | POA: Diagnosis not present

## 2021-11-04 DIAGNOSIS — H25013 Cortical age-related cataract, bilateral: Secondary | ICD-10-CM | POA: Diagnosis not present

## 2021-11-05 DIAGNOSIS — Z20822 Contact with and (suspected) exposure to covid-19: Secondary | ICD-10-CM | POA: Diagnosis not present

## 2021-11-06 DIAGNOSIS — Z20822 Contact with and (suspected) exposure to covid-19: Secondary | ICD-10-CM | POA: Diagnosis not present

## 2021-11-09 DIAGNOSIS — I10 Essential (primary) hypertension: Secondary | ICD-10-CM | POA: Diagnosis not present

## 2021-11-09 DIAGNOSIS — F330 Major depressive disorder, recurrent, mild: Secondary | ICD-10-CM | POA: Diagnosis not present

## 2021-11-09 DIAGNOSIS — J4520 Mild intermittent asthma, uncomplicated: Secondary | ICD-10-CM | POA: Diagnosis not present

## 2021-11-09 DIAGNOSIS — R0600 Dyspnea, unspecified: Secondary | ICD-10-CM | POA: Diagnosis not present

## 2021-11-12 DIAGNOSIS — Z79899 Other long term (current) drug therapy: Secondary | ICD-10-CM | POA: Diagnosis not present

## 2021-12-03 ENCOUNTER — Encounter: Payer: Self-pay | Admitting: Nurse Practitioner

## 2021-12-03 ENCOUNTER — Ambulatory Visit: Payer: Medicare HMO | Attending: Nurse Practitioner | Admitting: Nurse Practitioner

## 2021-12-03 VITALS — BP 136/82 | HR 57 | Ht <= 58 in | Wt 159.0 lb

## 2021-12-03 DIAGNOSIS — R072 Precordial pain: Secondary | ICD-10-CM | POA: Diagnosis not present

## 2021-12-03 DIAGNOSIS — J4540 Moderate persistent asthma, uncomplicated: Secondary | ICD-10-CM

## 2021-12-03 DIAGNOSIS — I10 Essential (primary) hypertension: Secondary | ICD-10-CM | POA: Diagnosis not present

## 2021-12-03 DIAGNOSIS — R0609 Other forms of dyspnea: Secondary | ICD-10-CM

## 2021-12-03 DIAGNOSIS — R600 Localized edema: Secondary | ICD-10-CM | POA: Diagnosis not present

## 2021-12-03 MED ORDER — METOPROLOL TARTRATE 25 MG PO TABS: ORAL_TABLET | ORAL | 0 refills | Status: DC

## 2021-12-03 NOTE — Patient Instructions (Signed)
Medication Instructions:  Metoprolol Tartrate 25 mg 2 hours prior to procedure.  *If you need a refill on your cardiac medications before your next appointment, please call your pharmacy*   Lab Work: BMET 1 week prior to procedure.   If you have labs (blood work) drawn today and your tests are completely normal, you will receive your results only by: [place] (if you have MyChart) OR A paper copy in the mail If you have any lab test that is abnormal or we need to change your treatment, we will call you to review the results.   Testing/Procedures:   Your cardiac CT will be scheduled at one of the below locations:   [facility] [address] [place], [place] [address] (336) [place] [address] [place], [place] [address] [phone]  [facility] [place], [place] [address] [phone]  If scheduled at [facility], please arrive at the [facility] and Children's Entrance (Entrance C2) of [facility] 30 minutes prior to test start time. You can use the FREE valet parking offered at entrance C (encouraged to control the heart rate for the test)  Proceed to the [facility] Radiology Department (first floor) to check-in and test prep.  All radiology patients and guests should use entrance C2 at [facility], accessed from [facility], even though the hospital's physical address listed is [address].    If scheduled at [facility] or [facility], please arrive 15 mins early for check-in and test prep.   Please follow these instructions carefully (unless otherwise directed):  Hold all erectile dysfunction medications at least 3 days (72 hrs) prior to test. (Ie viagra, cialis, sildenafil, tadalafil, etc) We will administer nitroglycerin during this exam.    On the Night Before the Test: Be sure to Drink plenty of water. Do not consume any caffeinated/decaffeinated beverages or chocolate 12 hours prior to your test. Do not take any antihistamines 12 hours prior to your test. If the patient has contrast allergy: Patient will need a prescription for Prednisone and very clear instructions (as follows): Prednisone 50 mg - take 13 hours prior to test Take another Prednisone 50 mg 7 hours prior to test Take another Prednisone 50 mg 1 hour prior to test Take Benadryl 50 mg 1 hour prior to test Patient must complete all four doses of above prophylactic medications. Patient will need a ride after test due to Benadryl.  On the Day of the Test: Drink plenty of water until 1 hour prior to the test. You may take your regular medications prior to the test.  Take metoprolol (Lopressor) two hours prior to test. HOLD Furosemide/Hydrochlorothiazide morning of the test. FEMALES- please wear underwire-free bra if available, avoid dresses & tight clothing   *For Clinical Staff only. Please instruct patient the following:* Heart Rate Medication Recommendations for Cardiac CT  Resting HR < 50 bpm  No medication  Resting HR 50-60 bpm and BP >110/50 mmHG   Consider Metoprolol tartrate 25 mg PO 90-120 min prior to scan  Resting HR 60-65 bpm and BP >110/50 mmHG  Metoprolol tartrate 50 mg PO 90-120 minutes prior to scan   Resting HR > 65 bpm and BP >110/50 mmHG  Metoprolol tartrate 100 mg PO 90-120 minutes prior to scan  Consider Ivabradine 10-15 mg PO or a calcium channel blocker for  resting HR >60 bpm and contraindication to metoprolol tartrate  Consider Ivabradine 10-15 mg PO in combination with metoprolol tartrate for HR >80 bpm         After the Test: Drink plenty of water. After receiving IV contrast, you may experience a mild flushed feeling. This is normal. On occasion, you may experience a mild rash up to 24 hours after the test. This is not  dangerous. If this occurs, you can take Benadryl 25 mg and increase your fluid intake. If you experience trouble breathing, this can be serious. If it is severe call 911 IMMEDIATELY. If it is mild, please call our office. If you take any of these medications: Glipizide/Metformin, Avandament, Glucavance, please do not take 48 hours after completing test unless otherwise instructed.  We will call to schedule your test 2-4 weeks out understanding that some insurance companies will need an authorization prior to the service being performed.   For non-scheduling related questions, please contact the cardiac imaging nurse navigator should you have any questions/concerns: Marchia Bond, Cardiac Imaging Nurse Navigator Gordy Clement, Cardiac Imaging Nurse Navigator [facility] Direct Office Dial: [phone]   For scheduling needs, including cancellations and rescheduling, please call [place], [phone].   Follow-Up: At [facility], you and your health needs are our priority.  As part of our continuing mission to provide you with exceptional heart care, we have created designated Provider Care Teams.  These Care Teams include your primary Cardiologist (physician) and Advanced Practice Providers (APPs -  Physician Assistants and Nurse Practitioners) who all work together to provide you with the care you need, when you need it.  We recommend signing up for the patient portal called "MyChart".  Sign up information is provided on this After Visit Summary.  MyChart is used to connect with patients for Virtual Visits (Telemedicine).  Patients are able to view lab/test results, encounter notes, upcoming appointments, etc.  Non-urgent messages can be sent to your provider as well.   To learn more about what you can do with MyChart, go to [email or web address].    Your next appointment:   6-8 week(s)  The format for your next appointment:   In Person  Provider:    Diona Browner, NP        Other Instructions   Important Information About Sugar     '

## 2021-12-03 NOTE — Progress Notes (Signed)
Office Visit    Patient Name: Rhonda Bush Date of Encounter: 12/03/2021  Primary Care Provider:  Donald Prose, MD Primary Cardiologist:  Evalina Field, MD  Chief Complaint    71 year old female with a history of dyspnea on exertion, lower extremity edema, hypertension, glaucoma, asthma, and RA who presents for follow-up related to chest pain and dyspnea on exertion.  Past Medical History    Past Medical History:  Diagnosis Date   Arthritis    rheumatoid arthritis -mild(feet,ankles,hand)-not bothersome now   Asthma    mild- not routine use of meds or inhalers   BURSITIS, LEFT SHOULDER 12/18/2006   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/2009   has improved   DEPRESSION 09/25/2006   GLAUCOMA NOS 09/25/2006   History of hiatal hernia    HYPERTENSION 09/25/2006   MIGRAINE HEADACHE 10/14/2006   OSTEOPOROSIS NOS 10/16/2006   PELVIC REGION, PAIN 12/17/2009   RESTLESS LEG SYNDROME, SEVERE 10/16/2006   not bothered now   SYNDROME, PREMENSTRUAL TENSION 10/14/2006   Past Surgical History:  Procedure Laterality Date   ABDOMINAL HYSTERECTOMY  1992 ,  [place] total in 1994   [place] b[place]-fibro[place]    BREAST BIOPSY Right    ESOPHAGOGASTRODUODENOSCOPY (EGD) WITH PROPOFOL N/A 02/13/2013   Procedure: ESOPHAGOGASTRODUODENOSCOPY (EGD) WITH PROPOFOL;  Surgeon: Arta Silence, MD;  Location: [facility];  Service: Endoscopy;  Laterality: N/A;   EUS N/A 02/13/2013   Procedure: ESOPHAGEAL ENDOSCOPIC ULTRASOUND (EUS) RADIAL;  Surgeon: Arta Silence, MD;  Location: [facility];  Service: Endoscopy;  Laterality: N/A;   PARATHYROIDECTOMY N/A 10/18/2018   Procedure: PARATHYROIDECTOMY;  Surgeon: Armandina Gemma, MD;  Location: [facility];  Service: General;  Laterality: N/A;   PARATHYROIDECTOMY N/A 10/23/2018   Procedure: NECK EXPLORATION, EVACUATION OF HEMATOMA;  Surgeon: Armandina Gemma, MD;  Location: [facility];  Service: General;  Laterality: N/A;    Allergies  Allergies  Allergen Reactions   Eggs Or  Egg-Derived Products Nausea And Vomiting and Other (See Comments)   Influenza Vaccines Nausea And Vomiting and Other (See Comments)   Other Nausea And Vomiting and Other (See Comments)    Egg Flu vaccine if it has egg derivative in it    History of Present Illness    71 year old female with a history of dyspnea on exertion, lower extremity edema, hypertension, glaucoma, asthma, and RA.  She was referred to Dr. Audie Box in January 2021 for the evaluation of dyspnea on exertion, lower extremity edema.  She reported a 107-monthhistory of intermittent shortness of breath.  Despite this, she reported excellent exercise tolerance and was walking 5 miles per day.  She noticed improvement in her lower extremity edema with discontinuation of amlodipine.  EKG was unremarkable.  Echocardiogram showed EF 55 to 60%, normal LV function, no RWMA, mild mitral valve regurgitation, mild aortic valve sclerosis without evidence of stenosis.  She has not been seen in follow-up since.  She saw her PCP recently and reported intermittent chest discomfort.  She presents today for follow-up.  Since her last visit been stable overall from a cardiac standpoint.  However, over the past 6 months she has noticed some increased dyspnea on exertion, occasional chest pressure/fullness.  Her symptoms occur both at rest and with exertion.  She has decreased her activity overall and is no longer walking 5 miles a day, instead, she is walking 1 mile a day.  She states her symptoms are different than her asthma.  She is concerned as she states she has a family history  of heart disease.  Otherwise, she reports feeling well denies any additional concerns today.  Home Medications    Current Outpatient Medications  Medication Sig Dispense Refill   albuterol (VENTOLIN HFA) 108 (90 Base) MCG/ACT inhaler Inhale 2 puffs into the lungs every 4 (four) hours as needed for wheezing or shortness of breath (coughing fits). 18 g 3   alendronate  (FOSAMAX) 70 MG tablet Take 70 mg by mouth once a week.     Alum Hydroxide-Mag Trisilicate (GAVISCON) 65-99.3 MG CHEW 2 tablets after meals and at bedtime as needed     amLODipine (NORVASC) 5 MG tablet Take 5 mg by mouth daily after breakfast.      aspirin 81 MG tablet Take 81 mg by mouth daily after breakfast.     cholecalciferol (VITAMIN D3) 25 MCG (1000 UT) tablet Take 1,000 Units by mouth daily after breakfast.      cyanocobalamin 1000 MCG tablet Take by mouth.     dorzolamide (TRUSOPT) 2 % ophthalmic solution Place 1 drop into both eyes 2 (two) times daily.     FLUoxetine (PROZAC) 20 MG tablet Take 60 mg by mouth daily.     FLUoxetine HCl 60 MG TABS Take 60 mg by mouth daily after breakfast.      fluticasone-salmeterol (ADVAIR HFA) 115-21 MCG/ACT inhaler Inhale 2 puffs into the lungs 2 (two) times daily. 1 each 11   folic acid (FOLVITE) 1 MG tablet Take 1 mg by mouth daily after breakfast.      furosemide (LASIX) 20 MG tablet Take 20 mg by mouth daily. As needed     hydrochlorothiazide (HYDRODIURIL) 25 MG tablet      hydroxychloroquine (PLAQUENIL) 200 MG tablet See admin instructions.     ibandronate (BONIVA) 150 MG tablet Take 150 mg by mouth every 30 (thirty) days.     latanoprost (XALATAN) 0.005 % ophthalmic solution Place 1 drop into both eyes at bedtime.     methotrexate (RHEUMATREX) 2.5 MG tablet Take 12.5 mg by mouth every Monday.     methotrexate (RHEUMATREX) 2.5 MG tablet Take by mouth.     metoprolol succinate (TOPROL-XL) 100 MG 24 hr tablet Take 100 mg by mouth daily after breakfast. Take with or immediately following a meal.     metoprolol tartrate (LOPRESSOR) 25 MG tablet Take 1 tab 2 hours prior to procedure. 1 tablet 0   Oyster Shell Calcium 500 MG TABS 1 tablet with meals     SUMAtriptan (IMITREX) 100 MG tablet Take 100 mg by mouth every 2 (two) hours as needed for migraine. May repeat in 2 hours if headache persists or recurs.     telmisartan (MICARDIS) 80 MG tablet Take  80 mg by mouth daily after breakfast.     Vitamin D-Vitamin K (VITAMIN K2-VITAMIN D3) 45-2000 MCG-UNIT CAPS 1 tablet     No current facility-administered medications for this visit.     Review of Systems    She denies palpitations, pnd, orthopnea, n, v, dizziness, syncope, edema, weight gain, or early satiety. All other systems reviewed and are otherwise negative except as noted above.   Physical Exam    VS:  BP 136/82   Pulse (!) 57   Ht '4\' 10"'$  (1.473 m)   Wt 159 lb (72.1 kg)   SpO2 97%   BMI 33.23 kg/m  GEN: Well nourished, well developed, in no acute distress. HEENT: normal. Neck: Supple, no JVD, carotid bruits, or masses. Cardiac: RRR, no murmurs, rubs, or gallops. No clubbing,  cyanosis, edema.  Radials/DP/PT 2+ and equal bilaterally.  Respiratory:  Respirations regular and unlabored, clear to auscultation bilaterally. GI: Soft, nontender, nondistended, BS + x 4. MS: no deformity or atrophy. Skin: warm and dry, no rash. Neuro:  Strength and sensation are intact. Psych: Normal affect.  Accessory Clinical Findings    ECG personally reviewed by me today -sinus bradycardia, 57 bpm- no acute changes.   Lab Results  Component Value Date   WBC 5.0 10/24/2018   HGB 11.8 (L) 10/24/2018   HCT 38.8 10/24/2018   MCV 94.4 10/24/2018   PLT 285 10/24/2018   Lab Results  Component Value Date   CREATININE 0.63 10/24/2018   BUN 9 10/24/2018   NA 138 10/24/2018   K 4.2 10/24/2018   CL 107 10/24/2018   CO2 22 10/24/2018   Lab Results  Component Value Date   ALT 27 11/11/2009   AST 23 11/11/2009   ALKPHOS 117 11/11/2009   BILITOT 0.5 11/11/2009   Lab Results  Component Value Date   CHOL 194 11/11/2009   HDL 52.90 11/11/2009   LDLCALC 125 (H) 11/11/2009   LDLDIRECT 144.2 11/06/2007   TRIG 80.0 11/11/2009   CHOLHDL 4 11/11/2009    No results found for: "HGBA1C"  Assessment & Plan    1. Chest pain/Dyspnea on exertion: Prior echo in 2021 in the setting of dyspnea on  exertion showed EF 55 to 60%, normal LV function, no RWMA, mild mitral valve regurgitation, mild aortic valve sclerosis without evidence of stenosis. She notes a 83-monthhistory of increased dyspnea on exertion, intermittent chest pressure/fullness.  Her symptoms occur both at rest and with activity.  Euvolemic and well compensated on exam. Symptoms are somewhat atypical, however, given previously normal echo, family history of CAD, will pursue coronary CT angiogram.  If CT shows evidence of CAD, consider initiation of statin therapy.  Continue aspirin.   2. Lower extremity edema: No evidence of fluid volume overload. Continue Lasix as needed.  3. Hypertension: BP well controlled. Continue current antihypertensive regimen.   4. Asthma: Stable.  She states her recent dyspnea is different from her asthma symptoms.  5. Disposition: Follow-up in 6-8 weeks.      ELenna Sciara NP 12/03/2021, 1:49 PM

## 2021-12-13 ENCOUNTER — Other Ambulatory Visit: Payer: Self-pay | Admitting: Family Medicine

## 2021-12-13 DIAGNOSIS — Z1231 Encounter for screening mammogram for malignant neoplasm of breast: Secondary | ICD-10-CM

## 2021-12-16 DIAGNOSIS — I10 Essential (primary) hypertension: Secondary | ICD-10-CM | POA: Diagnosis not present

## 2021-12-16 DIAGNOSIS — J4540 Moderate persistent asthma, uncomplicated: Secondary | ICD-10-CM | POA: Diagnosis not present

## 2021-12-16 DIAGNOSIS — R0609 Other forms of dyspnea: Secondary | ICD-10-CM | POA: Diagnosis not present

## 2021-12-16 DIAGNOSIS — R600 Localized edema: Secondary | ICD-10-CM | POA: Diagnosis not present

## 2021-12-16 DIAGNOSIS — R072 Precordial pain: Secondary | ICD-10-CM | POA: Diagnosis not present

## 2021-12-17 LAB — BASIC METABOLIC PANEL
BUN/Creatinine Ratio: 21 (ref 12–28)
BUN: 14 mg/dL (ref 8–27)
CO2: 28 mmol/L (ref 20–29)
Calcium: 9.9 mg/dL (ref 8.7–10.3)
Chloride: 105 mmol/L (ref 96–106)
Creatinine, Ser: 0.66 mg/dL (ref 0.57–1.00)
Glucose: 94 mg/dL (ref 70–99)
Potassium: 4.2 mmol/L (ref 3.5–5.2)
Sodium: 144 mmol/L (ref 134–144)
eGFR: 95 mL/min/{1.73_m2} (ref >59)

## 2021-12-21 ENCOUNTER — Telehealth: Payer: Self-pay

## 2021-12-21 NOTE — Telephone Encounter (Signed)
Spoke with pt. Pt was notified of lab results. Pt will continue her current medications and follow up as planned.  

## 2021-12-22 ENCOUNTER — Telehealth (HOSPITAL_COMMUNITY): Payer: Self-pay | Admitting: *Deleted

## 2021-12-22 NOTE — Telephone Encounter (Signed)
Patient returning call regarding upcoming cardiac imaging study; pt verbalizes understanding of appt date/time, parking situation and where to check in, and verified current allergies; name and call back number provided for further questions should they arise  [name] RN Navigator Cardiac Imaging [facility] [phone] office [phone] cell  Patient aware to arrive at 9am. 

## 2021-12-22 NOTE — Telephone Encounter (Signed)
Attempted to call patient regarding upcoming cardiac CT appointment. °Left message on voicemail with name and callback number ° °Laureano Hetzer RN Navigator Cardiac Imaging °[facility] °[phone] Office °[phone] Cell ° °

## 2021-12-23 ENCOUNTER — Ambulatory Visit (HOSPITAL_COMMUNITY)
Admission: RE | Admit: 2021-12-23 | Discharge: 2021-12-23 | Disposition: A | Discharge status: Home / Self Care | Payer: Medicare HMO | Source: Ambulatory Visit | Attending: Nurse Practitioner | Admitting: Nurse Practitioner

## 2021-12-23 DIAGNOSIS — R072 Precordial pain: Secondary | ICD-10-CM | POA: Diagnosis not present

## 2021-12-23 MED ORDER — IOHEXOL 350 MG/ML SOLN
100.0000 mL | Freq: Once | INTRAVENOUS | Status: AC | PRN
  Administered 2021-12-23: 100 mL via INTRAVENOUS

## 2021-12-23 MED ORDER — NITROGLYCERIN 0.4 MG SL SUBL
SUBLINGUAL_TABLET | SUBLINGUAL | Status: AC
  Filled 2021-12-23: qty 2

## 2021-12-23 MED ORDER — NITROGLYCERIN 0.4 MG SL SUBL
0.8000 mg | SUBLINGUAL_TABLET | Freq: Once | SUBLINGUAL | Status: AC
  Administered 2021-12-23: 0.8 mg via SUBLINGUAL

## 2021-12-30 ENCOUNTER — Telehealth: Payer: Self-pay

## 2021-12-30 NOTE — Telephone Encounter (Signed)
Spoke with pt. Pt was notified of CT Coronary angio results. Pt will continue her current medication and follow up as planned.

## 2022-01-11 ENCOUNTER — Encounter: Payer: Self-pay | Admitting: Nurse Practitioner

## 2022-01-11 ENCOUNTER — Ambulatory Visit: Payer: Medicare HMO | Attending: Nurse Practitioner | Admitting: Nurse Practitioner

## 2022-01-11 VITALS — BP 116/80 | HR 58 | Ht <= 58 in | Wt 157.4 lb

## 2022-01-11 DIAGNOSIS — R600 Localized edema: Secondary | ICD-10-CM | POA: Diagnosis not present

## 2022-01-11 DIAGNOSIS — R0609 Other forms of dyspnea: Secondary | ICD-10-CM | POA: Diagnosis not present

## 2022-01-11 DIAGNOSIS — I10 Essential (primary) hypertension: Secondary | ICD-10-CM

## 2022-01-11 DIAGNOSIS — J4540 Moderate persistent asthma, uncomplicated: Secondary | ICD-10-CM

## 2022-01-11 DIAGNOSIS — R072 Precordial pain: Secondary | ICD-10-CM

## 2022-01-11 NOTE — Progress Notes (Signed)
Office Visit    Patient Name: Rhonda Bush Date of Encounter: 01/11/2022  Primary Care Provider:  Donald Prose, MD Primary Cardiologist:  Evalina Field, MD  Chief Complaint    71 year old female with a history of dyspnea on exertion, lower extremity edema, hypertension, glaucoma, asthma, and RA who presents for follow-up related to chest pain and dyspnea on exertion.  Past Medical History    Past Medical History:  Diagnosis Date   Arthritis    rheumatoid arthritis -mild(feet,ankles,hand)-not bothersome now   Asthma    mild- not routine use of meds or inhalers   BURSITIS, LEFT SHOULDER 12/18/2006   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/2009   has improved   DEPRESSION 09/25/2006   GLAUCOMA NOS 09/25/2006   History of hiatal hernia    HYPERTENSION 09/25/2006   MIGRAINE HEADACHE 10/14/2006   OSTEOPOROSIS NOS 10/16/2006   PELVIC REGION, PAIN 12/17/2009   RESTLESS LEG SYNDROME, SEVERE 10/16/2006   not bothered now   SYNDROME, PREMENSTRUAL TENSION 10/14/2006   Past Surgical History:  Procedure Laterality Date   ABDOMINAL HYSTERECTOMY  1992 ,  made total in 1994   [place] - bso-fibroids    BREAST BIOPSY Right    ESOPHAGOGASTRODUODENOSCOPY (EGD) WITH PROPOFOL N/A 02/13/2013   Procedure: ESOPHAGOGASTRODUODENOSCOPY (EGD) WITH PROPOFOL;  Surgeon: Arta Silence, MD;  Location: [facility];  Service: Endoscopy;  Laterality: N/A;   EUS N/A 02/13/2013   Procedure: ESOPHAGEAL ENDOSCOPIC ULTRASOUND (EUS) RADIAL;  Surgeon: Arta Silence, MD;  Location: [facility];  Service: Endoscopy;  Laterality: N/A;   PARATHYROIDECTOMY N/A 10/18/2018   Procedure: PARATHYROIDECTOMY;  Surgeon: Armandina Gemma, MD;  Location: [facility];  Service: General;  Laterality: N/A;   PARATHYROIDECTOMY N/A 10/23/2018   Procedure: NECK EXPLORATION, EVACUATION OF HEMATOMA;  Surgeon: Armandina Gemma, MD;  Location: [facility];  Service: General;  Laterality: N/A;    Allergies  Allergies  Allergen Reactions   Eggs Or  Egg-Derived Products Nausea And Vomiting and Other (See Comments)   Influenza Vaccines Nausea And Vomiting and Other (See Comments)   Other Nausea And Vomiting and Other (See Comments)    Egg Flu vaccine if it has egg derivative in it    History of Present Illness    71 year old female with a history of dyspnea on exertion, lower extremity edema, hypertension, glaucoma, asthma, and RA.   She was referred to Dr. Audie Box in January 2021 for the evaluation of dyspnea on exertion, lower extremity edema.  She reported a 29-monthhistory of intermittent shortness of breath.  Despite this, she reported excellent exercise tolerance and was walking 5 miles per day.  She noticed improvement in her lower extremity edema with discontinuation of amlodipine.  EKG was unremarkable.  Echocardiogram showed EF 55 to 60%, normal LV function, no RWMA, mild mitral valve regurgitation, mild aortic valve sclerosis without evidence of stenosis.  She was last seen in the office on 12/03/2021 and reported a 688-monthistory of increased dyspnea on exertion, intermittent chest pressure/fullness, decreased activity tolerance. Coronary CT angiogram in 12/2021 showed coronary calcium score of 0, minimal soft plaque, no evidence of obstructive CAD.  She presents today for follow-up. Since her last visit she has been stable from a cardiac standpoint though she does note ongoing dyspnea on exertion.  She has seen no improvement in her symptoms. She does note intermittent nonpitting bilateral lower extremity edema, improved with Lasix.  She denies chest pain, PND, orthopnea, weight gain.  She also notes occasional lightheadedness with position changes.  Despite her dyspnea, she remains active, walking almost 10,000 steps a day.  Home Medications    Current Outpatient Medications  Medication Sig Dispense Refill   albuterol (VENTOLIN HFA) 108 (90 Base) MCG/ACT inhaler Inhale 2 puffs into the lungs every 4 (four) hours as needed for  wheezing or shortness of breath (coughing fits). 18 g 3   alendronate (FOSAMAX) 70 MG tablet Take 70 mg by mouth once a week.     Alum Hydroxide-Mag Trisilicate (GAVISCON) 96-29.5 MG CHEW 2 tablets after meals and at bedtime as needed     amLODipine (NORVASC) 5 MG tablet Take 5 mg by mouth daily after breakfast.      aspirin 81 MG tablet Take 81 mg by mouth daily after breakfast.     cholecalciferol (VITAMIN D3) 25 MCG (1000 UT) tablet Take 1,000 Units by mouth daily after breakfast.      cyanocobalamin 1000 MCG tablet Take by mouth.     dorzolamide (TRUSOPT) 2 % ophthalmic solution Place 1 drop into both eyes 2 (two) times daily.     FLUoxetine (PROZAC) 20 MG tablet Take 60 mg by mouth daily.     FLUoxetine HCl 60 MG TABS Take 60 mg by mouth daily after breakfast.      fluticasone-salmeterol (ADVAIR HFA) 115-21 MCG/ACT inhaler Inhale 2 puffs into the lungs 2 (two) times daily. 1 each 11   folic acid (FOLVITE) 1 MG tablet Take 1 mg by mouth daily after breakfast.      hydrochlorothiazide (HYDRODIURIL) 25 MG tablet      hydroxychloroquine (PLAQUENIL) 200 MG tablet See admin instructions.     ibandronate (BONIVA) 150 MG tablet Take 150 mg by mouth every 30 (thirty) days.     latanoprost (XALATAN) 0.005 % ophthalmic solution Place 1 drop into both eyes at bedtime.     methotrexate (RHEUMATREX) 2.5 MG tablet Take 12.5 mg by mouth every Monday.     methotrexate (RHEUMATREX) 2.5 MG tablet Take by mouth.     metoprolol succinate (TOPROL-XL) 100 MG 24 hr tablet Take 100 mg by mouth daily after breakfast. Take with or immediately following a meal.     metoprolol tartrate (LOPRESSOR) 25 MG tablet Take 1 tab 2 hours prior to procedure. 1 tablet 0   Oyster Shell Calcium 500 MG TABS 1 tablet with meals     SUMAtriptan (IMITREX) 100 MG tablet Take 100 mg by mouth every 2 (two) hours as needed for migraine. May repeat in 2 hours if headache persists or recurs.     telmisartan (MICARDIS) 80 MG tablet Take 80  mg by mouth daily after breakfast.     Vitamin D-Vitamin K (VITAMIN K2-VITAMIN D3) 45-2000 MCG-UNIT CAPS 1 tablet     furosemide (LASIX) 20 MG tablet Take 20 mg by mouth daily. As needed (Patient not taking: Reported on 01/11/2022)     No current facility-administered medications for this visit.     Review of Systems   She denies chest pain, palpitations, pnd, orthopnea, n, v, dizziness, syncope, weight gain, or early satiety. All other systems reviewed and are otherwise negative except as noted above.  Physical Exam    VS:  BP 116/80   Pulse (!) 58   Ht '4\' 10"'$  (1.473 m)   Wt 157 lb 6.4 oz (71.4 kg)   SpO2 94%   BMI 32.90 kg/m  GEN: Well nourished, well developed, in no acute distress. HEENT: normal. Neck: Supple, no JVD, carotid bruits, or masses. Cardiac: RRR, no murmurs,  rubs, or gallops. No clubbing, cyanosis, edema.  Radials/DP/PT 2+ and equal bilaterally.  Respiratory:  Respirations regular and unlabored, clear to auscultation bilaterally. GI: Soft, nontender, nondistended, BS + x 4. MS: no deformity or atrophy. Skin: warm and dry, no rash. Neuro:  Strength and sensation are intact. Psych: Normal affect.  Accessory Clinical Findings    ECG personally reviewed by me today -Sinus bradycardia, 58 bpm- no acute changes.   Lab Results  Component Value Date   WBC 5.0 10/24/2018   HGB 11.8 (L) 10/24/2018   HCT 38.8 10/24/2018   MCV 94.4 10/24/2018   PLT 285 10/24/2018   Lab Results  Component Value Date   CREATININE 0.66 12/16/2021   BUN 14 12/16/2021   NA 144 12/16/2021   K 4.2 12/16/2021   CL 105 12/16/2021   CO2 28 12/16/2021   Lab Results  Component Value Date   ALT 27 11/11/2009   AST 23 11/11/2009   ALKPHOS 117 11/11/2009   BILITOT 0.5 11/11/2009   Lab Results  Component Value Date   CHOL 194 11/11/2009   HDL 52.90 11/11/2009   LDLCALC 125 (H) 11/11/2009   LDLDIRECT 144.2 11/06/2007   TRIG 80.0 11/11/2009   CHOLHDL 4 11/11/2009    No results  found for: "HGBA1C"  Assessment & Plan    1. Chest pain/Dyspnea on exertion: Prior echo in 2021 in the setting of dyspnea on exertion showed EF 55 to 60%, normal LV function, no RWMA, mild mitral valve regurgitation, mild aortic valve sclerosis without evidence of stenosis. At her last visit she noted a 23-monthhistory of increased dyspnea on exertion, intermittent chest pressure/fullness. Recent coronary CTA showed coronary calcium score of 0, no evidence of obstructive CAD.  She has noticed noted improvement in her dyspnea.  Will repeat echocardiogram to rule out any structural abnormalities that could be contributing.  If echo normal, recommend follow-up with pulmonology.   2. Lower extremity edema: No evidence of fluid volume overload.  She does note occasional swelling in her bilateral lower extremities, L>R.  Continue Lasix as needed.   3. Hypertension: BP well controlled. Continue current antihypertensive regimen.   4. Asthma: If cardiac work-up unrevealing for recent dyspnea, recommend follow-up with pulmonology.   5. Disposition: Follow-up in 3-5 months.      ELenna Sciara NP 01/11/2022, 1:51 PM

## 2022-01-11 NOTE — Patient Instructions (Addendum)
Medication Instructions:  Your physician recommends that you continue on your current medications as directed. Please refer to the Current Medication list given to you today.   *If you need a refill on your cardiac medications before your next appointment, please call your pharmacy*   Lab Work: NONE ordered at this time of appointment   If you have labs (blood work) drawn today and your tests are completely normal, you will receive your results only by: [place] (if you have MyChart) OR A paper copy in the mail If you have any lab test that is abnormal or we need to change your treatment, we will call you to review the results.   Testing/Procedures: Your physician has requested that you have an echocardiogram. Echocardiography is a painless test that uses sound waves to create images of your heart. It provides your doctor with information about the size and shape of your heart and how well your heart's chambers and valves are working. This procedure takes approximately one hour. There are no restrictions for this procedure. Please do NOT wear cologne, perfume, aftershave, or lotions (deodorant is allowed). Please arrive 15 minutes prior to your appointment time.     Follow-Up: At [facility], you and your health needs are our priority.  As part of our continuing mission to provide you with exceptional heart care, we have created designated Provider Care Teams.  These Care Teams include your primary Cardiologist (physician) and Advanced Practice Providers (APPs -  Physician Assistants and Nurse Practitioners) who all work together to provide you with the care you need, when you need it.  We recommend signing up for the patient portal called "MyChart".  Sign up information is provided on this After Visit Summary.  MyChart is used to connect with patients for Virtual Visits (Telemedicine).  Patients are able to view lab/test results, encounter notes, upcoming appointments, etc.   Non-urgent messages can be sent to your provider as well.   To learn more about what you can do with MyChart, go to [email or web address].    Your next appointment:   3-5 month(s)  The format for your next appointment:   In Person  Provider:   Evalina Field, MD     Other Instructions   Important Information About Sugar

## 2022-01-12 ENCOUNTER — Ambulatory Visit: Payer: Medicare HMO

## 2022-01-17 DIAGNOSIS — M0579 Rheumatoid arthritis with rheumatoid factor of multiple sites without organ or systems involvement: Secondary | ICD-10-CM | POA: Diagnosis not present

## 2022-01-17 DIAGNOSIS — D72819 Decreased white blood cell count, unspecified: Secondary | ICD-10-CM | POA: Diagnosis not present

## 2022-01-17 DIAGNOSIS — Z79899 Other long term (current) drug therapy: Secondary | ICD-10-CM | POA: Diagnosis not present

## 2022-01-17 DIAGNOSIS — M810 Age-related osteoporosis without current pathological fracture: Secondary | ICD-10-CM | POA: Diagnosis not present

## 2022-01-18 ENCOUNTER — Ambulatory Visit
Admission: RE | Admit: 2022-01-18 | Discharge: 2022-01-18 | Disposition: A | Discharge status: Home / Self Care | Payer: Medicare HMO | Source: Ambulatory Visit | Attending: Family Medicine | Admitting: Family Medicine

## 2022-01-18 DIAGNOSIS — Z1231 Encounter for screening mammogram for malignant neoplasm of breast: Secondary | ICD-10-CM | POA: Diagnosis not present

## 2022-01-27 ENCOUNTER — Ambulatory Visit (HOSPITAL_COMMUNITY): Payer: Medicare HMO | Attending: Internal Medicine

## 2022-01-27 DIAGNOSIS — R600 Localized edema: Secondary | ICD-10-CM

## 2022-01-27 DIAGNOSIS — R0609 Other forms of dyspnea: Secondary | ICD-10-CM | POA: Diagnosis not present

## 2022-01-27 DIAGNOSIS — I10 Essential (primary) hypertension: Secondary | ICD-10-CM

## 2022-01-27 DIAGNOSIS — J4540 Moderate persistent asthma, uncomplicated: Secondary | ICD-10-CM

## 2022-01-27 DIAGNOSIS — R072 Precordial pain: Secondary | ICD-10-CM | POA: Diagnosis not present

## 2022-01-27 LAB — ECHOCARDIOGRAM COMPLETE
Area-P 1/2: 3.88 cm2
S' Lateral: 2.7 cm

## 2022-02-01 ENCOUNTER — Telehealth: Payer: Self-pay | Admitting: Cardiovascular Disease

## 2022-02-01 NOTE — Telephone Encounter (Signed)
Patient given results of echo per E. Monge: "Echocardiogram shows normal heart pumping function, the mitral valve is mildly leaky, however, this would not explain her shortness of breath. Overall this was a good report.  Recommend follow-up with pulmonology to further assess shortness of breath. Thank you.-EM"   Patient education on leaky valve. Recommended keeping BP under control and limiting sodium intake. BP 123/65. Patient does avoid sodium.

## 2022-02-01 NOTE — Telephone Encounter (Signed)
Patient is returning call to discuss echo results. °

## 2022-02-08 ENCOUNTER — Encounter: Payer: Self-pay | Admitting: Pulmonary Disease

## 2022-02-08 ENCOUNTER — Ambulatory Visit: Payer: Medicare HMO | Admitting: Pulmonary Disease

## 2022-02-08 ENCOUNTER — Ambulatory Visit (INDEPENDENT_AMBULATORY_CARE_PROVIDER_SITE_OTHER): Payer: Medicare HMO

## 2022-02-08 VITALS — BP 130/84 | HR 56 | Ht 58.5 in | Wt 158.2 lb

## 2022-02-08 DIAGNOSIS — J4550 Severe persistent asthma, uncomplicated: Secondary | ICD-10-CM | POA: Diagnosis not present

## 2022-02-08 DIAGNOSIS — R0600 Dyspnea, unspecified: Secondary | ICD-10-CM | POA: Diagnosis not present

## 2022-02-08 LAB — CBC WITH DIFFERENTIAL/PLATELET
Basophils Absolute: 0 10*3/uL (ref 0.0–0.1)
Basophils Relative: 0.6 % (ref 0.0–3.0)
Eosinophils Absolute: 0.1 10*3/uL (ref 0.0–0.7)
Eosinophils Relative: 3.6 % (ref 0.0–5.0)
HCT: 41.2 % (ref 36.0–46.0)
Hemoglobin: 13.6 g/dL (ref 12.0–15.0)
Lymphocytes Relative: 43.5 % (ref 12.0–46.0)
Lymphs Abs: 1.7 10*3/uL (ref 0.7–4.0)
MCHC: 33.1 g/dL (ref 30.0–36.0)
MCV: 90.3 fl (ref 78.0–100.0)
Monocytes Absolute: 0.4 10*3/uL (ref 0.1–1.0)
Monocytes Relative: 11.3 % (ref 3.0–12.0)
Neutro Abs: 1.6 10*3/uL (ref 1.4–7.7)
Neutrophils Relative %: 41 % — ABNORMAL LOW (ref 43.0–77.0)
Platelets: 315 10*3/uL (ref 150.0–400.0)
RBC: 4.56 Mil/uL (ref 3.87–5.11)
RDW: 13.8 % (ref 11.5–15.5)
WBC: 3.8 10*3/uL — ABNORMAL LOW (ref 4.0–10.5)

## 2022-02-08 LAB — POCT EXHALED NITRIC OXIDE: FeNO level (ppb): 14

## 2022-02-08 MED ORDER — FLUTICASONE-SALMETEROL 230-21 MCG/ACT IN AERO: 2.0000 | INHALATION_SPRAY | Freq: Two times a day (BID) | RESPIRATORY_TRACT | 12 refills | Status: DC

## 2022-02-08 NOTE — Patient Instructions (Signed)
Severe persistent asthma with worsening control Change Advair from 115 dose to 230 dose, 2 puffs twice a day CBC with differential Serum IgE Exhaled nitric oxide Full pulmonary function test next visit Chest x-ray Stay active, continue exercise  Left leg swelling: D-dimer  Follow-up with Dr. Shearon Stalls with a full pulmonary function test in 6 to 8 weeks

## 2022-02-08 NOTE — Progress Notes (Signed)
Synopsis: Followed by Dr. Lenice Llamas for moderate persistent asthma, has rheumatoid arthritis treated with methotrexate and Plaquenil  Subjective:   PATIENT ID: Rhonda Bush GENDER: female DOB: 1950/07/31, MRN: [id number]   HPI  Chief Complaint  Patient presents with   Follow-up    SOB during exertion and rest    Rhonda Bush is here as an acute visit today.  She says taht she has been feeling more short of breath on exertion.  She has noticed more dyspnea when she is walking.  Thi started 5-6 months ago.  She has been coughing and wheezing more than typical.  No heartburn or indigestion, no post nasal drip.  No fever, no chills. She is using her albutewrol more frequently, it helps but she has more dyspnea over all.    She is still taking Advair.  No animals in her house, no new mold or dust in her house cmpared to before.  Hasn't had any new environmental.   No new medicines this year.  She has noticed left leg swelling for the last several months as well.  Record review: October 2023 cardiology visit reviewed where the patient was seen in the setting of dyspnea.  He was noted to have a coronary calcium score which was negative.  At that time reported that dyspnea was improving.  Echocardiogram was ordered with the idea that if it was normal she should be referred to pulmonology.  Echocardiogram was performed and was read as normal so it was recommended she follow-up with [place].  Past Medical History:  Diagnosis Date   Arthritis    rheumatoid arthritis -mild(feet,ankles,hand)-not bothersome now   Asthma    mild- not routine use of meds or inhalers   BURSITIS, LEFT SHOULDER 12/18/2006   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/2009   has improved   DEPRESSION 09/25/2006   GLAUCOMA NOS 09/25/2006   History of hiatal hernia    HYPERTENSION 09/25/2006   MIGRAINE HEADACHE 10/14/2006   OSTEOPOROSIS NOS 10/16/2006   PELVIC REGION, PAIN 12/17/2009   RESTLESS LEG SYNDROME, SEVERE 10/16/2006    not bothered now   SYNDROME, PREMENSTRUAL TENSION 10/14/2006     Family History  Problem Relation Age of Onset   Heart disease Mother    Heart attack Father    Asthma Neg Hx      Social History   Socioeconomic History   Marital status: Divorced    Spouse name: Not on file   Number of children: 1   Years of education: Not on file   Highest education level: Not on file  Occupational History   Occupation: retired - Psychologist, educational  Tobacco Use   Smoking status: Never   Smokeless tobacco: Never  Vaping Use   Vaping Use: Never used  Substance and Sexual Activity   Alcohol use: Yes    Comment: very rare   Drug use: No   Sexual activity: Not Currently  Other Topics Concern   Not on file  Social History Narrative   Not on file   Social Determinants of Health   Financial Resource Strain: Not on file  Food Insecurity: Not on file  Transportation Needs: Not on file  Physical Activity: Not on file  Stress: Not on file  Social Connections: Not on file  Intimate Partner Violence: Not on file     Allergies  Allergen Reactions   Eggs Or Egg-Derived Products Nausea And Vomiting and Other (See Comments)   Influenza Vaccines Nausea And Vomiting and Other (See Comments)  Other Nausea And Vomiting and Other (See Comments)    Egg Flu vaccine if it has egg derivative in it     Outpatient Medications Prior to Visit  Medication Sig Dispense Refill   albuterol (VENTOLIN HFA) 108 (90 Base) MCG/ACT inhaler Inhale 2 puffs into the lungs every 4 (four) hours as needed for wheezing or shortness of breath (coughing fits). 18 g 3   alendronate (FOSAMAX) 70 MG tablet Take 70 mg by mouth once a week.     Alum Hydroxide-Mag Trisilicate (GAVISCON) 29-24.4 MG CHEW 2 tablets after meals and at bedtime as needed     amLODipine (NORVASC) 5 MG tablet Take 5 mg by mouth daily after breakfast.      aspirin 81 MG tablet Take 81 mg by mouth daily after breakfast.     cholecalciferol (VITAMIN D3) 25  MCG (1000 UT) tablet Take 1,000 Units by mouth daily after breakfast.      cyanocobalamin 1000 MCG tablet Take by mouth.     dorzolamide (TRUSOPT) 2 % ophthalmic solution Place 1 drop into both eyes 2 (two) times daily.     FLUoxetine (PROZAC) 20 MG tablet Take 60 mg by mouth daily.     FLUoxetine HCl 60 MG TABS Take 60 mg by mouth daily after breakfast.      fluticasone-salmeterol (ADVAIR HFA) 115-21 MCG/ACT inhaler Inhale 2 puffs into the lungs 2 (two) times daily. 1 each 11   folic acid (FOLVITE) 1 MG tablet Take 1 mg by mouth daily after breakfast.      furosemide (LASIX) 20 MG tablet Take 20 mg by mouth daily. As needed     hydrochlorothiazide (HYDRODIURIL) 25 MG tablet      hydroxychloroquine (PLAQUENIL) 200 MG tablet See admin instructions.     ibandronate (BONIVA) 150 MG tablet Take 150 mg by mouth every 30 (thirty) days.     latanoprost (XALATAN) 0.005 % ophthalmic solution Place 1 drop into both eyes at bedtime.     methotrexate (RHEUMATREX) 2.5 MG tablet Take 12.5 mg by mouth every Monday.     methotrexate (RHEUMATREX) 2.5 MG tablet Take by mouth.     metoprolol succinate (TOPROL-XL) 100 MG 24 hr tablet Take 100 mg by mouth daily after breakfast. Take with or immediately following a meal.     metoprolol tartrate (LOPRESSOR) 25 MG tablet Take 1 tab 2 hours prior to procedure. 1 tablet 0   Oyster Shell Calcium 500 MG TABS 1 tablet with meals     SUMAtriptan (IMITREX) 100 MG tablet Take 100 mg by mouth every 2 (two) hours as needed for migraine. May repeat in 2 hours if headache persists or recurs.     telmisartan (MICARDIS) 80 MG tablet Take 80 mg by mouth daily after breakfast.     Vitamin D-Vitamin K (VITAMIN K2-VITAMIN D3) 45-2000 MCG-UNIT CAPS 1 tablet     No facility-administered medications prior to visit.    Review of Systems  Constitutional:  Negative for chills, fever, malaise/fatigue and weight loss.  HENT:  Negative for congestion, sinus pain and sore throat.    Respiratory:  Positive for cough, shortness of breath and wheezing. Negative for sputum production.   Cardiovascular:  Negative for chest pain and leg swelling.      Objective:  Physical Exam   Vitals:   02/08/22 1029  BP: 130/84  Pulse: (!) 56  SpO2: 99%  Weight: 158 lb 3.2 oz (71.8 kg)  Height: 4' 10.5" (1.486 m)    Gen:  well appearing HENT: OP clear, neck supple PULM: crackles left base B, normal effort  CV: RRR, no mgr GI: BS+, soft, nontender Derm: no cyanosis or rash, left leg edema Psyche: normal mood and affect   CBC    Component Value Date/Time   WBC 5.0 10/24/2018 0158   RBC 4.11 10/24/2018 0158   HGB 11.8 (L) 10/24/2018 0158   HCT 38.8 10/24/2018 0158   PLT 285 10/24/2018 0158   MCV 94.4 10/24/2018 0158   MCH 28.7 10/24/2018 0158   MCHC 30.4 10/24/2018 0158   RDW 13.8 10/24/2018 0158   LYMPHSABS 1.3 11/11/2009 0807   MONOABS 0.4 11/11/2009 0807   EOSABS 0.1 11/11/2009 0807   BASOSABS 0.0 11/11/2009 0807     Chest imaging: October 2023 coronary calcium score lung windows independently reviewed showing no evidence of interstitial lung disease  PFT: 01/2022 FENO 14 ppb  Labs:  Path:  Echo: November 2023 TTE LVEF 55 to 60%, RVSP normal, RV size and function normal, mild mitral regurgitation  Heart Catheterization:       Assessment & Plan:   Dyspnea, unspecified type - Plan: DG Chest 2 View  Severe persistent asthma, unspecified whether complicated - Plan: CBC w/Diff, IgE, Pulmonary function test, POCT EXHALED NITRIC OXIDE  Discussion: Alegandra presents with worsening asthma control, unclear etiology.  Also has left leg swelling which is isolated.  Recent cardiac workup negative.  Plan: Severe persistent asthma with worsening control Change Advair from 115 dose to 230 dose, 2 puffs twice a day CBC with differential Serum IgE Exhaled nitric oxide Full pulmonary function test next visit Chest x-ray Stay active, continue  exercise  Left leg swelling: D-dimer  Follow-up with Dr. Shearon Stalls with a full pulmonary function test in 6 to 8 weeks  Immunizations: Immunization History  Administered Date(s) Administered   PFIZER(Purple Top)SARS-COV-2 Vaccination 04/05/2019, 04/26/2019, 12/13/2019, 07/31/2020, 12/23/2020   Td 03/14/1998, 11/12/2008     Current Outpatient Medications:    albuterol (VENTOLIN HFA) 108 (90 Base) MCG/ACT inhaler, Inhale 2 puffs into the lungs every 4 (four) hours as needed for wheezing or shortness of breath (coughing fits)., Disp: 18 g, Rfl: 3   alendronate (FOSAMAX) 70 MG tablet, Take 70 mg by mouth once a week., Disp: , Rfl:    Alum Hydroxide-Mag Trisilicate (GAVISCON) 35-45.6 MG CHEW, 2 tablets after meals and at bedtime as needed, Disp: , Rfl:    amLODipine (NORVASC) 5 MG tablet, Take 5 mg by mouth daily after breakfast. , Disp: , Rfl:    aspirin 81 MG tablet, Take 81 mg by mouth daily after breakfast., Disp: , Rfl:    cholecalciferol (VITAMIN D3) 25 MCG (1000 UT) tablet, Take 1,000 Units by mouth daily after breakfast. , Disp: , Rfl:    cyanocobalamin 1000 MCG tablet, Take by mouth., Disp: , Rfl:    dorzolamide (TRUSOPT) 2 % ophthalmic solution, Place 1 drop into both eyes 2 (two) times daily., Disp: , Rfl:    FLUoxetine (PROZAC) 20 MG tablet, Take 60 mg by mouth daily., Disp: , Rfl:    FLUoxetine HCl 60 MG TABS, Take 60 mg by mouth daily after breakfast. , Disp: , Rfl:    fluticasone-salmeterol (ADVAIR HFA) 115-21 MCG/ACT inhaler, Inhale 2 puffs into the lungs 2 (two) times daily., Disp: 1 each, Rfl: 11   folic acid (FOLVITE) 1 MG tablet, Take 1 mg by mouth daily after breakfast. , Disp: , Rfl:    furosemide (LASIX) 20 MG tablet, Take 20 mg by mouth  daily. As needed, Disp: , Rfl:    hydrochlorothiazide (HYDRODIURIL) 25 MG tablet, , Disp: , Rfl:    hydroxychloroquine (PLAQUENIL) 200 MG tablet, See admin instructions., Disp: , Rfl:    ibandronate (BONIVA) 150 MG tablet, Take 150 mg by  mouth every 30 (thirty) days., Disp: , Rfl:    latanoprost (XALATAN) 0.005 % ophthalmic solution, Place 1 drop into both eyes at bedtime., Disp: , Rfl:    methotrexate (RHEUMATREX) 2.5 MG tablet, Take 12.5 mg by mouth every Monday., Disp: , Rfl:    methotrexate (RHEUMATREX) 2.5 MG tablet, Take by mouth., Disp: , Rfl:    metoprolol succinate (TOPROL-XL) 100 MG 24 hr tablet, Take 100 mg by mouth daily after breakfast. Take with or immediately following a meal., Disp: , Rfl:    metoprolol tartrate (LOPRESSOR) 25 MG tablet, Take 1 tab 2 hours prior to procedure., Disp: 1 tablet, Rfl: 0   Oyster Shell Calcium 500 MG TABS, 1 tablet with meals, Disp: , Rfl:    SUMAtriptan (IMITREX) 100 MG tablet, Take 100 mg by mouth every 2 (two) hours as needed for migraine. May repeat in 2 hours if headache persists or recurs., Disp: , Rfl:    telmisartan (MICARDIS) 80 MG tablet, Take 80 mg by mouth daily after breakfast., Disp: , Rfl:    Vitamin D-Vitamin K (VITAMIN K2-VITAMIN D3) 45-2000 MCG-UNIT CAPS, 1 tablet, Disp: , Rfl:

## 2022-02-08 NOTE — Progress Notes (Signed)
Patient was called and she voiced understanding.

## 2022-02-09 LAB — IGE: IgE (Immunoglobulin E), Serum: 22 kU/L (ref <=114)

## 2022-03-09 DIAGNOSIS — I10 Essential (primary) hypertension: Secondary | ICD-10-CM | POA: Diagnosis not present

## 2022-03-09 DIAGNOSIS — H6990 Unspecified Eustachian tube disorder, unspecified ear: Secondary | ICD-10-CM | POA: Diagnosis not present

## 2022-03-16 DIAGNOSIS — J4520 Mild intermittent asthma, uncomplicated: Secondary | ICD-10-CM | POA: Diagnosis not present

## 2022-03-16 DIAGNOSIS — J209 Acute bronchitis, unspecified: Secondary | ICD-10-CM | POA: Diagnosis not present

## 2022-04-19 DIAGNOSIS — D72819 Decreased white blood cell count, unspecified: Secondary | ICD-10-CM | POA: Diagnosis not present

## 2022-04-19 DIAGNOSIS — M0579 Rheumatoid arthritis with rheumatoid factor of multiple sites without organ or systems involvement: Secondary | ICD-10-CM | POA: Diagnosis not present

## 2022-04-19 DIAGNOSIS — M810 Age-related osteoporosis without current pathological fracture: Secondary | ICD-10-CM | POA: Diagnosis not present

## 2022-04-19 DIAGNOSIS — Z79899 Other long term (current) drug therapy: Secondary | ICD-10-CM | POA: Diagnosis not present

## 2022-05-02 DIAGNOSIS — Z79899 Other long term (current) drug therapy: Secondary | ICD-10-CM | POA: Diagnosis not present

## 2022-05-02 DIAGNOSIS — H35033 Hypertensive retinopathy, bilateral: Secondary | ICD-10-CM | POA: Diagnosis not present

## 2022-05-02 DIAGNOSIS — H2513 Age-related nuclear cataract, bilateral: Secondary | ICD-10-CM | POA: Diagnosis not present

## 2022-05-02 DIAGNOSIS — H402231 Chronic angle-closure glaucoma, bilateral, mild stage: Secondary | ICD-10-CM | POA: Diagnosis not present

## 2022-05-17 DIAGNOSIS — Z79899 Other long term (current) drug therapy: Secondary | ICD-10-CM | POA: Diagnosis not present

## 2022-05-20 DIAGNOSIS — M7072 Other bursitis of hip, left hip: Secondary | ICD-10-CM | POA: Diagnosis not present

## 2022-05-20 DIAGNOSIS — M0579 Rheumatoid arthritis with rheumatoid factor of multiple sites without organ or systems involvement: Secondary | ICD-10-CM | POA: Diagnosis not present

## 2022-05-20 DIAGNOSIS — Z0000 Encounter for general adult medical examination without abnormal findings: Secondary | ICD-10-CM | POA: Diagnosis not present

## 2022-05-20 DIAGNOSIS — F330 Major depressive disorder, recurrent, mild: Secondary | ICD-10-CM | POA: Diagnosis not present

## 2022-05-20 DIAGNOSIS — R600 Localized edema: Secondary | ICD-10-CM | POA: Diagnosis not present

## 2022-05-20 DIAGNOSIS — J4520 Mild intermittent asthma, uncomplicated: Secondary | ICD-10-CM | POA: Diagnosis not present

## 2022-05-20 DIAGNOSIS — I10 Essential (primary) hypertension: Secondary | ICD-10-CM | POA: Diagnosis not present

## 2022-05-20 DIAGNOSIS — M810 Age-related osteoporosis without current pathological fracture: Secondary | ICD-10-CM | POA: Diagnosis not present

## 2022-05-20 DIAGNOSIS — D84821 Immunodeficiency due to drugs: Secondary | ICD-10-CM | POA: Diagnosis not present

## 2022-05-22 NOTE — Progress Notes (Unsigned)
Cardiology Office Note:   Date:  05/23/2022  NAME:  Rhonda Bush    MRN: [id number] DOB:  1950-07-22   PCP:  Donald Prose, MD  Cardiologist:  Evalina Field, MD  Electrophysiologist:  None   Referring MD: Donald Prose, MD   Chief Complaint  Patient presents with   Follow-up         History of Present Illness:   Rhonda Bush is a 72 y.o. female with a hx of HTN who presents for follow-up. CCTA normal. Echo normal.  She reports she is doing well.  Denies any chest pain or trouble breathing.  Coronary CTA with 0 calcium.  No obstructive disease.  She reports she is walking 2 to 3 miles daily.  No significant shortness of breath.  Overall doing quite well.  CV exam normal.  Offered testing.  She is on aspirin.  No strong indication for this.  We discussed stopping this.  Past Medical History: Past Medical History:  Diagnosis Date   Arthritis    rheumatoid arthritis -mild(feet,ankles,hand)-not bothersome now   Asthma    mild- not routine use of meds or inhalers   BURSITIS, LEFT SHOULDER 12/18/2006   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/2009   has improved   DEPRESSION 09/25/2006   GLAUCOMA NOS 09/25/2006   History of hiatal hernia    HYPERTENSION 09/25/2006   MIGRAINE HEADACHE 10/14/2006   OSTEOPOROSIS NOS 10/16/2006   PELVIC REGION, PAIN 12/17/2009   RESTLESS LEG SYNDROME, SEVERE 10/16/2006   not bothered now   SYNDROME, PREMENSTRUAL TENSION 10/14/2006    Past Surgical History: Past Surgical History:  Procedure Laterality Date   ABDOMINAL HYSTERECTOMY  1992 ,  [place] total in 1994   [place]-fibro[place]    BREAST BIOPSY Right    ESOPHAGOGASTRODUODENOSCOPY (EGD) WITH PROPOFOL N/A 02/13/2013   Procedure: ESOPHAGOGASTRODUODENOSCOPY (EGD) WITH PROPOFOL;  Surgeon: Arta Silence, MD;  Location: [facility];  Service: Endoscopy;  Laterality: N/A;   EUS N/A 02/13/2013   Procedure: ESOPHAGEAL ENDOSCOPIC ULTRASOUND (EUS) RADIAL;  Surgeon: Arta Silence, MD;  Location: [facility]  ENDOSCOPY;  Service: Endoscopy;  Laterality: N/A;   PARATHYROIDECTOMY N/A 10/18/2018   Procedure: PARATHYROIDECTOMY;  Surgeon: Armandina Gemma, MD;  Location: [facility];  Service: General;  Laterality: N/A;   PARATHYROIDECTOMY N/A 10/23/2018   Procedure: NECK EXPLORATION, EVACUATION OF HEMATOMA;  Surgeon: Armandina Gemma, MD;  Location: [facility];  Service: General;  Laterality: N/A;    Current Medications: Current Meds  Medication Sig   albuterol (VENTOLIN HFA) 108 (90 Base) MCG/ACT inhaler Inhale 2 puffs into the lungs every 4 (four) hours as needed for wheezing or shortness of breath (coughing fits).   alendronate (FOSAMAX) 70 MG tablet Take 70 mg by mouth once a week.   Alum Hydroxide-Mag Trisilicate (GAVISCON) [id number] MG CHEW 2 tablets after meals and at bedtime as needed   amLODipine (NORVASC) 5 MG tablet Take 5 mg by mouth daily after breakfast.    cholecalciferol (VITAMIN D3) 25 MCG (1000 UT) tablet Take 1,000 Units by mouth daily after breakfast.    cyanocobalamin 1000 MCG tablet Take by mouth.   dorzolamide (TRUSOPT) 2 % ophthalmic solution Place 1 drop into both eyes 2 (two) times daily.   FLUoxetine (PROZAC) 20 MG tablet Take 60 mg by mouth daily.   fluticasone-salmeterol (ADVAIR HFA) 230-21 MCG/ACT inhaler Inhale 2 puffs into the lungs 2 (two) times daily.   folic acid (FOLVITE) 1 MG tablet Take 1 mg by mouth daily after breakfast.  furosemide (LASIX) 20 MG tablet Take 20 mg by mouth daily. As needed   hydrochlorothiazide (HYDRODIURIL) 25 MG tablet    hydroxychloroquine (PLAQUENIL) 200 MG tablet See admin instructions.   ibandronate (BONIVA) 150 MG tablet Take 150 mg by mouth every 30 (thirty) days.   latanoprost (XALATAN) 0.005 % ophthalmic solution Place 1 drop into both eyes at bedtime.   methotrexate (RHEUMATREX) 2.5 MG tablet Take 12.5 mg by mouth every Monday.   metoprolol succinate (TOPROL-XL) 100 MG 24 hr tablet Take 100 mg by mouth daily after breakfast. Take with or immediately  following a meal.   Oyster Shell Calcium 500 MG TABS 1 tablet with meals   SUMAtriptan (IMITREX) 100 MG tablet Take 100 mg by mouth every 2 (two) hours as needed for migraine. May repeat in 2 hours if headache persists or recurs.   telmisartan (MICARDIS) 80 MG tablet Take 80 mg by mouth daily after breakfast.   Vitamin D-Vitamin K (VITAMIN K2-VITAMIN D3) 45-2000 MCG-UNIT CAPS 1 tablet   [DISCONTINUED] aspirin 81 MG tablet Take 81 mg by mouth daily after breakfast.     Allergies:    Egg-derived products, Influenza vaccines, and Other   Social History: Social History   Socioeconomic History   Marital status: Divorced    Spouse name: Not on file   Number of children: 1   Years of education: Not on file   Highest education level: Not on file  Occupational History   Occupation: retired Advice worker  Tobacco Use   Smoking status: Never   Smokeless tobacco: Never  Vaping Use   Vaping Use: Never used  Substance and Sexual Activity   Alcohol use: Yes    Comment: very rare   Drug use: No   Sexual activity: Not Currently  Other Topics Concern   Not on file  Social History Narrative   Not on file   Social Determinants of Health   Financial Resource Strain: Not on file  Food Insecurity: Not on file  Transportation Needs: Not on file  Physical Activity: Not on file  Stress: Not on file  Social Connections: Not on file     Family History: The patient's family history includes Heart attack in her father; Heart disease in her mother. There is no history of Asthma.  ROS:   All other ROS reviewed and negative. Pertinent positives noted in the HPI.     EKGs/Labs/Other Studies Reviewed:   The following studies were personally reviewed by me today:  Recent Labs: 12/16/2021: BUN 14; Creatinine, Ser 0.66; Potassium 4.2; Sodium 144 02/08/2022: Hemoglobin 13.6; Platelets 315.0   Recent Lipid Panel    Component Value Date/Time   CHOL 194 11/11/2009 0807   TRIG 80.0 11/11/2009  0807   HDL 52.90 11/11/2009 0807   CHOLHDL 4 11/11/2009 0807   VLDL 16.0 11/11/2009 0807   LDLCALC 125 (H) 11/11/2009 0807   LDLDIRECT 144.2 11/06/2007 0811    Physical Exam:   VS:  BP 122/72 (BP Location: Left Arm, Patient Position: Sitting, Cuff Size: Normal)   Pulse (!) 57   Ht 4' 10.5" (1.486 m)   Wt 164 lb (74.4 kg)   SpO2 96%   BMI 33.69 kg/m    Wt Readings from Last 3 Encounters:  05/23/22 164 lb (74.4 kg)  02/08/22 158 lb 3.2 oz (71.8 kg)  01/11/22 157 lb 6.4 oz (71.4 kg)    General: Well nourished, well developed, in no acute distress Head: Atraumatic, normal size  Eyes: PEERLA, EOMI  Neck: Supple, no JVD Endocrine: No thryomegaly Cardiac: Normal S1, S2; RRR; no murmurs, rubs, or gallops Lungs: Clear to auscultation bilaterally, no wheezing, rhonchi or rales  Abd: Soft, nontender, no hepatomegaly  Ext: No edema, pulses 2+ Musculoskeletal: No deformities, BUE and BLE strength normal and equal Skin: Warm and dry, no rashes   Neuro: Alert and oriented to person, place, time, and situation, CNII-XII grossly intact, no focal deficits  Psych: Normal mood and affect   ASSESSMENT:   Tamula Chiappone is a 72 y.o. female who presents for the following: 1. Dyspnea on exertion     PLAN:   1. Dyspnea on exertion -Symptoms have improved with treatment of asthma.  Coronary CTA normal.  No coronary calcium.  Echo normal.  No signs of heart failure.  Overall her cardiac workup has been negative.  She is on aspirin but no indication.  Given a calcium score of 0 she can stop aspirin.  She will see [place] back as needed.     Disposition: Return if symptoms worsen or fail to improve.  Medication Adjustments/Labs and Tests Ordered: Current medicines are reviewed at length with the patient today.  Concerns regarding medicines are outlined above.  No orders of the defined types were placed in this encounter.  No orders of the defined types were placed in this encounter.   Patient  Instructions  Medication Instructions:  STOP Aspirin   *If you need a refill on your cardiac medications before your next appointment, please call your pharmacy*   Follow-Up: At [facility], you and your health needs are our priority.  As part of our continuing mission to provide you with exceptional heart care, we have created designated Provider Care Teams.  These Care Teams include your primary Cardiologist (physician) and Advanced Practice Providers (APPs -  Physician Assistants and Nurse Practitioners) who all work together to provide you with the care you need, when you need it.  We recommend signing up for the patient portal called "MyChart".  Sign up information is provided on this After Visit Summary.  MyChart is used to connect with patients for Virtual Visits (Telemedicine).  Patients are able to view lab/test results, encounter notes, upcoming appointments, etc.  Non-urgent messages can be sent to your provider as well.   To learn more about what you can do with MyChart, go to [email or web address].    Your next appointment:   As needed  Provider:   Evalina Field, MD       Time Spent with Patient: I have spent a total of 25 minutes with patient reviewing hospital notes, telemetry, EKGs, labs and examining the patient as well as establishing an assessment and plan that was discussed with the patient.  > 50% of time was spent in direct patient care.  Signed, Addison Naegeli. Audie Box, MD, [facility]  [address] [place], [place] [address] [phone]  05/23/2022 8:27 AM

## 2022-05-23 ENCOUNTER — Encounter: Payer: Self-pay | Admitting: Cardiovascular Disease

## 2022-05-23 ENCOUNTER — Ambulatory Visit: Payer: Medicare HMO | Attending: Cardiovascular Disease | Admitting: Cardiovascular Disease

## 2022-05-23 VITALS — BP 122/72 | HR 57 | Ht 58.5 in | Wt 164.0 lb

## 2022-05-23 DIAGNOSIS — R0609 Other forms of dyspnea: Secondary | ICD-10-CM

## 2022-05-23 NOTE — Patient Instructions (Signed)
Medication Instructions:  STOP Aspirin   *If you need a refill on your cardiac medications before your next appointment, please call your pharmacy*   Follow-Up: At [facility], you and your health needs are our priority.  As part of our continuing mission to provide you with exceptional heart care, we have created designated Provider Care Teams.  These Care Teams include your primary Cardiologist (physician) and Advanced Practice Providers (APPs -  Physician Assistants and Nurse Practitioners) who all work together to provide you with the care you need, when you need it.  We recommend signing up for the patient portal called "MyChart".  Sign up information is provided on this After Visit Summary.  MyChart is used to connect with patients for Virtual Visits (Telemedicine).  Patients are able to view lab/test results, encounter notes, upcoming appointments, etc.  Non-urgent messages can be sent to your provider as well.   To learn more about what you can do with MyChart, go to [email or web address].    Your next appointment:   As needed  Provider:   Evalina Field, MD

## 2022-06-20 ENCOUNTER — Ambulatory Visit: Payer: Medicare HMO | Admitting: Internal Medicine

## 2022-06-20 ENCOUNTER — Ambulatory Visit (INDEPENDENT_AMBULATORY_CARE_PROVIDER_SITE_OTHER): Payer: Medicare HMO | Admitting: Internal Medicine

## 2022-06-20 ENCOUNTER — Encounter: Payer: Self-pay | Admitting: Internal Medicine

## 2022-06-20 VITALS — BP 130/80 | HR 56 | Temp 98.6°F | Ht 58.5 in | Wt 159.6 lb

## 2022-06-20 DIAGNOSIS — K219 Gastro-esophageal reflux disease without esophagitis: Secondary | ICD-10-CM | POA: Diagnosis not present

## 2022-06-20 DIAGNOSIS — J301 Allergic rhinitis due to pollen: Secondary | ICD-10-CM

## 2022-06-20 DIAGNOSIS — J4540 Moderate persistent asthma, uncomplicated: Secondary | ICD-10-CM | POA: Diagnosis not present

## 2022-06-20 DIAGNOSIS — J4550 Severe persistent asthma, uncomplicated: Secondary | ICD-10-CM | POA: Diagnosis not present

## 2022-06-20 LAB — PULMONARY FUNCTION TEST
DL/VA % pred: 125 %
DL/VA: 5.46 ml/min/mmHg/L
DLCO cor % pred: 121 %
DLCO cor: 19.1 ml/min/mmHg
DLCO unc % pred: 121 %
DLCO unc: 19.1 ml/min/mmHg
FEF 25-75 Post: 1.88 L/sec
FEF 25-75 Pre: 1.75 L/sec
FEF2575-%Change-Post: 7 %
FEF2575-%Pred-Post: 116 %
FEF2575-%Pred-Pre: 108 %
FEV1-%Change-Post: 1 %
FEV1-%Pred-Post: 86 %
FEV1-%Pred-Pre: 85 %
FEV1-Post: 1.52 L
FEV1-Pre: 1.5 L
FEV1FVC-%Change-Post: 0 %
FEV1FVC-%Pred-Pre: 111 %
FEV6-%Change-Post: 1 %
FEV6-%Pred-Post: 80 %
FEV6-%Pred-Pre: 79 %
FEV6-Post: 1.79 L
FEV6-Pre: 1.77 L
FEV6FVC-%Pred-Post: 105 %
FEV6FVC-%Pred-Pre: 105 %
FVC-%Change-Post: 1 %
FVC-%Pred-Post: 77 %
FVC-%Pred-Pre: 75 %
FVC-Post: 1.8 L
FVC-Pre: 1.77 L
Post FEV1/FVC ratio: 84 %
Post FEV6/FVC ratio: 100 %
Pre FEV1/FVC ratio: 85 %
Pre FEV6/FVC Ratio: 100 %
RV % pred: 329 %
RV: 6.15 L
TLC % pred: 193 %
TLC: 8.02 L

## 2022-06-20 MED ORDER — CETIRIZINE HCL 10 MG PO TABS: 10.0000 mg | ORAL_TABLET | Freq: Every day | ORAL | 5 refills | Status: AC

## 2022-06-20 NOTE — Patient Instructions (Signed)
Please schedule follow up scheduled with myself in 6 months.  If my schedule is not open yet, we will contact you with a reminder closer to that time. Please call [phone] if you haven't heard from [place] a month before.   I'm glad your asthma is doing well. Try taking cetirizine (zyrtec) once daily to see if that reduces your need for albuterol. Try taking over the counter reflux medication to see if that helps your evening chest discomfort/tightness/wheezing  Please call me sooner if questions/concerns about your breathing.

## 2022-06-20 NOTE — Progress Notes (Signed)
Full PFT performed today. °

## 2022-06-20 NOTE — Patient Instructions (Signed)
Full PFT performed today. °

## 2022-06-20 NOTE — Progress Notes (Signed)
Rhonda Bush    [id number]    1951-03-02  Primary Care Physician:Sun, Charise Carwin, MD Date of Appointment: 06/20/2022 Established Patient Visit  Chief complaint:   Chief Complaint  Patient presents with   Follow-up    Pft review,      HPI: Rhonda Bush is a 72 y.o. woman with severe persistent asthma and rheumatoid arthritis on methorexate and plaquenil  Interval Updates: Here for follow up after PFTs.I last saw her about a year ago. In the interim she was seen by Dr. Kendrick Fries who increased her advair dosing,  obtained blood work and PFTs.  Blood work negative for IgE and Eos.   Currently on methotrexate, folic acid and plaquenil for RA. Symptoms controlled. Joints stable.  Using albuterol 1-2 times/day usually with exertion  Current Regimen: advair 230-21 2 puffs BID. and albuterol prn Asthma Triggers: exertion, stress/anxious ,strong smells and perfumes, environmental allergies. Exacerbations in the last year: none for asthma.  History of hospitalization or intubation: none Allergy Testing: SPT reviewed in 2020 - wnl GERD:  yes, does not take anything for it.  Allergic Rhinitis: denies ACT:  Asthma Control Test ACT Total Score  07/23/2021  9:13 AM 20  04/20/2021 11:11 AM 18  01/18/2021 11:00 AM 20   FeNO: 14 ppb on Nov 2023  No interval hospitalizations or ED visits.   I have reviewed the patient's family social and past medical history and updated as appropriate.   Past Medical History:  Diagnosis Date   Arthritis    rheumatoid arthritis -mild(feet,ankles,hand)-not bothersome now   Asthma    mild- not routine use of meds or inhalers   BURSITIS, LEFT SHOULDER 12/18/2006   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/2009   has improved   DEPRESSION 09/25/2006   GLAUCOMA NOS 09/25/2006   History of hiatal hernia    HYPERTENSION 09/25/2006   MIGRAINE HEADACHE 10/14/2006   OSTEOPOROSIS NOS 10/16/2006   PELVIC REGION, PAIN 12/17/2009   RESTLESS LEG  SYNDROME, SEVERE 10/16/2006   not bothered now   SYNDROME, PREMENSTRUAL TENSION 10/14/2006    Past Surgical History:  Procedure Laterality Date   ABDOMINAL HYSTERECTOMY  1992 ,  [place] total in 1994   [place] b[place]-fibro[place]    BREAST BIOPSY Right    ESOPHAGOGASTRODUODENOSCOPY (EGD) WITH PROPOFOL N/A 02/13/2013   Procedure: ESOPHAGOGASTRODUODENOSCOPY (EGD) WITH PROPOFOL;  Surgeon: Willis Modena, MD;  Location: [facility];  Service: Endoscopy;  Laterality: N/A;   EUS N/A 02/13/2013   Procedure: ESOPHAGEAL ENDOSCOPIC ULTRASOUND (EUS) RADIAL;  Surgeon: Willis Modena, MD;  Location: [facility];  Service: Endoscopy;  Laterality: N/A;   PARATHYROIDECTOMY N/A 10/18/2018   Procedure: PARATHYROIDECTOMY;  Surgeon: Darnell Level, MD;  Location: [facility];  Service: General;  Laterality: N/A;   PARATHYROIDECTOMY N/A 10/23/2018   Procedure: NECK EXPLORATION, EVACUATION OF HEMATOMA;  Surgeon: Darnell Level, MD;  Location: [facility];  Service: General;  Laterality: N/A;    Family History  Problem Relation Age of Onset   Heart disease Mother    Heart attack Father    Asthma Neg Hx     Social History   Occupational History   Occupation: retired Scientist, research (physical sciences)  Tobacco Use   Smoking status: Never   Smokeless tobacco: Never  Vaping Use   Vaping Use: Never used  Substance and Sexual Activity   Alcohol use: Yes    Comment: very rare   Drug use: No   Sexual activity: Not Currently  Physical Exam: Blood pressure 130/80, pulse (!) 56, temperature 98.6 F (37 C), temperature source Oral, height 4' 10.5" (1.486 m), weight 159 lb 9.6 oz (72.4 kg), SpO2 97 %.  Gen:      No acute distress ENT:  no nasal polyps, mucus membranes moist Lungs:    No increased respiratory effort, symmetric chest wall excursion, clear to auscultation bilaterally, no wheezes or crackles CV:         Regular rate and rhythm; no murmurs, rubs, or gallops.  No pedal edema  Data Reviewed: Imaging:  PFTs:  PFTs obtained June 20 2022 - normal spirometry, no bronchodilator response. Normal lung volumes and diffusion capacity.   Labs:  Immunization status: Immunization History  Administered Date(s) Administered   PFIZER(Purple Top)SARS-COV-2 Vaccination 04/05/2019, 04/26/2019, 12/13/2019, 07/31/2020, 12/23/2020   Td 03/14/1998, 11/12/2008    External Records Personally Reviewed:   Assessment:  Moderate Persistent Asthma, controlled  Plan/Recommendations: Continue advair, prn albuterol PFTS show no evidence of restrictive lung disease.   Return to Care: Return in about 6 months (around 12/20/2022).   Durel Salts, MD Pulmonary and Critical Care Medicine [facility] [phone]

## 2022-07-14 DIAGNOSIS — Z09 Encounter for follow-up examination after completed treatment for conditions other than malignant neoplasm: Secondary | ICD-10-CM | POA: Diagnosis not present

## 2022-07-14 DIAGNOSIS — K648 Other hemorrhoids: Secondary | ICD-10-CM | POA: Diagnosis not present

## 2022-07-14 DIAGNOSIS — D123 Benign neoplasm of transverse colon: Secondary | ICD-10-CM | POA: Diagnosis not present

## 2022-07-14 DIAGNOSIS — D125 Benign neoplasm of sigmoid colon: Secondary | ICD-10-CM | POA: Diagnosis not present

## 2022-07-14 DIAGNOSIS — Z800 Family history of malignant neoplasm of digestive organs: Secondary | ICD-10-CM | POA: Diagnosis not present

## 2022-07-14 DIAGNOSIS — K5730 Diverticulosis of large intestine without perforation or abscess without bleeding: Secondary | ICD-10-CM | POA: Diagnosis not present

## 2022-07-14 DIAGNOSIS — Z86010 Personal history of colonic polyps: Secondary | ICD-10-CM | POA: Diagnosis not present

## 2022-07-18 DIAGNOSIS — D72819 Decreased white blood cell count, unspecified: Secondary | ICD-10-CM | POA: Diagnosis not present

## 2022-07-18 DIAGNOSIS — D123 Benign neoplasm of transverse colon: Secondary | ICD-10-CM | POA: Diagnosis not present

## 2022-07-18 DIAGNOSIS — M0579 Rheumatoid arthritis with rheumatoid factor of multiple sites without organ or systems involvement: Secondary | ICD-10-CM | POA: Diagnosis not present

## 2022-07-18 DIAGNOSIS — M810 Age-related osteoporosis without current pathological fracture: Secondary | ICD-10-CM | POA: Diagnosis not present

## 2022-07-18 DIAGNOSIS — Z79899 Other long term (current) drug therapy: Secondary | ICD-10-CM | POA: Diagnosis not present

## 2022-07-18 DIAGNOSIS — D125 Benign neoplasm of sigmoid colon: Secondary | ICD-10-CM | POA: Diagnosis not present

## 2022-07-25 DIAGNOSIS — E559 Vitamin D deficiency, unspecified: Secondary | ICD-10-CM | POA: Diagnosis not present

## 2022-07-25 DIAGNOSIS — M810 Age-related osteoporosis without current pathological fracture: Secondary | ICD-10-CM | POA: Diagnosis not present

## 2022-07-25 DIAGNOSIS — E210 Primary hyperparathyroidism: Secondary | ICD-10-CM | POA: Diagnosis not present

## 2022-07-28 DIAGNOSIS — I10 Essential (primary) hypertension: Secondary | ICD-10-CM | POA: Diagnosis not present

## 2022-07-28 DIAGNOSIS — M810 Age-related osteoporosis without current pathological fracture: Secondary | ICD-10-CM | POA: Diagnosis not present

## 2022-07-28 DIAGNOSIS — M0579 Rheumatoid arthritis with rheumatoid factor of multiple sites without organ or systems involvement: Secondary | ICD-10-CM | POA: Diagnosis not present

## 2022-07-28 DIAGNOSIS — E210 Primary hyperparathyroidism: Secondary | ICD-10-CM | POA: Diagnosis not present

## 2022-07-28 DIAGNOSIS — E559 Vitamin D deficiency, unspecified: Secondary | ICD-10-CM | POA: Diagnosis not present

## 2022-08-01 DIAGNOSIS — H402231 Chronic angle-closure glaucoma, bilateral, mild stage: Secondary | ICD-10-CM | POA: Diagnosis not present

## 2022-09-06 DIAGNOSIS — M79605 Pain in left leg: Secondary | ICD-10-CM | POA: Diagnosis not present

## 2022-09-06 DIAGNOSIS — D229 Melanocytic nevi, unspecified: Secondary | ICD-10-CM | POA: Diagnosis not present

## 2022-09-06 DIAGNOSIS — G2581 Restless legs syndrome: Secondary | ICD-10-CM | POA: Diagnosis not present

## 2022-09-06 DIAGNOSIS — Z0000 Encounter for general adult medical examination without abnormal findings: Secondary | ICD-10-CM | POA: Diagnosis not present

## 2022-09-08 ENCOUNTER — Ambulatory Visit (INDEPENDENT_AMBULATORY_CARE_PROVIDER_SITE_OTHER): Payer: Medicare HMO | Admitting: Professional Counselor

## 2022-09-08 ENCOUNTER — Encounter: Payer: Self-pay | Admitting: Physician Assistant

## 2022-09-08 ENCOUNTER — Ambulatory Visit (INDEPENDENT_AMBULATORY_CARE_PROVIDER_SITE_OTHER): Payer: Medicare HMO | Admitting: Physician Assistant

## 2022-09-08 VITALS — BP 125/75 | HR 76 | Ht <= 58 in | Wt 161.0 lb

## 2022-09-08 DIAGNOSIS — F331 Major depressive disorder, recurrent, moderate: Secondary | ICD-10-CM

## 2022-09-08 DIAGNOSIS — F5105 Insomnia due to other mental disorder: Secondary | ICD-10-CM

## 2022-09-08 DIAGNOSIS — M069 Rheumatoid arthritis, unspecified: Secondary | ICD-10-CM | POA: Diagnosis not present

## 2022-09-08 DIAGNOSIS — F99 Mental disorder, not otherwise specified: Secondary | ICD-10-CM | POA: Diagnosis not present

## 2022-09-08 DIAGNOSIS — F411 Generalized anxiety disorder: Secondary | ICD-10-CM

## 2022-09-08 MED ORDER — DULOXETINE HCL 60 MG PO CPEP: 60.0000 mg | ORAL_CAPSULE | Freq: Every day | ORAL | 1 refills | Status: DC

## 2022-09-08 MED ORDER — DULOXETINE HCL 30 MG PO CPEP: 30.0000 mg | ORAL_CAPSULE | Freq: Every day | ORAL | 0 refills | Status: DC

## 2022-09-08 MED ORDER — ALPRAZOLAM 0.25 MG PO TABS: 0.2500 mg | ORAL_TABLET | Freq: Two times a day (BID) | ORAL | 0 refills | Status: AC | PRN

## 2022-09-08 NOTE — Progress Notes (Signed)
[facility] MD/PA/NP Initial Note  09/08/2022 5:47 PM Rhonda Bush  MRN:  [id number]  Chief Complaint:  Chief Complaint   Establish Care    HPI:  Doesn't want to do much of anything. Used to walk 5 miles per day but hasn't done that in a long time. Used to read her Bible a lot but doesn't now, is involved in church and Bible study though. Used to love jigzaw puzzles and hasn't done one all year.  Appetite is decreased. Doesn't care if she eats or not. Has decreased hygiene, can go 3-4 days without showering, and will put off brushing her teeth, but not that long.  Does cry easily. Stays in bed a lot but doesn't sleep during the day. Even at night she goes to sleep but can't stay asleep. No SI/HI.   Feels anxious almost daily, overwhelmed, driving alone is hard, has PA maybe 3-4 times/month. When having a PA, time, sitting down and drinking water helps. Doesn't last too long.   Patient denies increased energy with decreased need for sleep, increased talkativeness, racing thoughts, impulsivity or risky behaviors, increased spending, increased libido, grandiosity, increased irritability or anger, paranoia, or hallucinations.  Visit Diagnosis:    ICD-10-CM   1. Major depressive disorder, recurrent episode, moderate (HCC)  F33.1     2. Generalized anxiety disorder  F41.1     3. Rheumatoid arthritis, involving unspecified site, unspecified whether rheumatoid factor present (HCC)  M06.9     4. Insomnia due to other mental disorder  F51.05    F99       Past Psychiatric History:   Past medications for mental health diagnoses include: Prozac, Xanax for 1 month back in the 80s.   Past Medical History:  Past Medical History:  Diagnosis Date   Anxiety    Arthritis    rheumatoid arthritis -mild(feet,ankles,hand)-not bothersome now   Asthma    mild- not routine use of meds or inhalers   BURSITIS, LEFT SHOULDER 12/18/2006   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/2009   has  improved   DEPRESSION 09/25/2006   GLAUCOMA NOS 09/25/2006   History of hiatal hernia    HYPERTENSION 09/25/2006   MIGRAINE HEADACHE 10/14/2006   OSTEOPOROSIS NOS 10/16/2006   PELVIC REGION, PAIN 12/17/2009   RESTLESS LEG SYNDROME, SEVERE 10/16/2006   not bothered now   SYNDROME, PREMENSTRUAL TENSION 10/14/2006    Past Surgical History:  Procedure Laterality Date   ABDOMINAL HYSTERECTOMY  1992 ,  [place] total in 1994   [place] bso-fibroids    BREAST BIOPSY Right    ESOPHAGOGASTRODUODENOSCOPY (EGD) WITH PROPOFOL N/A 02/13/2013   Procedure: ESOPHAGOGASTRODUODENOSCOPY (EGD) WITH PROPOFOL;  Surgeon: Willis Modena, MD;  Location: [facility];  Service: Endoscopy;  Laterality: N/A;   EUS N/A 02/13/2013   Procedure: ESOPHAGEAL ENDOSCOPIC ULTRASOUND (EUS) RADIAL;  Surgeon: Willis Modena, MD;  Location: [facility];  Service: Endoscopy;  Laterality: N/A;   PARATHYROIDECTOMY N/A 10/18/2018   Procedure: PARATHYROIDECTOMY;  Surgeon: Darnell Level, MD;  Location: [facility];  Service: General;  Laterality: N/A;   PARATHYROIDECTOMY N/A 10/23/2018   Procedure: NECK EXPLORATION, EVACUATION OF HEMATOMA;  Surgeon: Darnell Level, MD;  Location: [facility];  Service: General;  Laterality: N/A;    Family Psychiatric History:  See below  Family History:  Family History  Problem Relation Age of Onset   Heart disease Mother    Diabetes type II Mother    Kidney failure Mother    Heart attack Father    High blood  pressure Father    Healthy Sister    Healthy Sister    Colon cancer Brother    Arthritis Brother    Vision loss Brother    High blood pressure Brother    Healthy Son    Asthma Neg Hx     Social History:  Social History   Socioeconomic History   Marital status: Divorced    Spouse name: Not on file   Number of children: 1   Years of education: Not on file   Highest education level: Not on file  Occupational History   Occupation: retired - Set designer  Tobacco Use   Smoking status: Never    Smokeless tobacco: Never  Vaping Use   Vaping Use: Never used  Substance and Sexual Activity   Alcohol use: Yes    Comment: very rare, once in a 'blue moon'   Drug use: No   Sexual activity: Not Currently  Other Topics Concern   Not on file  Social History Narrative   Divorced, grew up in [place], [place]. Had a good childhood, was poor 'but we didn't know it.' Never abused.    She's retired from [employer]. Retired in 2021.    She and an elderly lady live together.       Caffeine-none except 1 Dr. Reino Kent per month   Legal-none   Christian   Social Determinants of Health   Financial Resource Strain: Low Risk  (09/08/2022)   Overall Financial Resource Strain (CARDIA)    Difficulty of Paying Living Expenses: Not hard at all  Food Insecurity: No Food Insecurity (09/08/2022)   Hunger Vital Sign    Worried About Running Out of Food in the Last Year: Never true    Ran Out of Food in the Last Year: Never true  Transportation Needs: No Transportation Needs (09/08/2022)   PRAPARE - Administrator, Civil Service (Medical): No    Lack of Transportation (Non-Medical): No  Physical Activity: Inactive (09/08/2022)   Exercise Vital Sign    Days of Exercise per Week: 0 days    Minutes of Exercise per Session: 0 min  Stress: No Stress Concern Present (09/08/2022)   [employer] of Occupational Health - Occupational Stress Questionnaire    Feeling of Stress : Only a little  Social Connections: Moderately Integrated (09/08/2022)   Social Connection and Isolation Panel [NHANES]    Frequency of Communication with Friends and Family: More than three times a week    Frequency of Social Gatherings with Friends and Family: More than three times a week    Attends Religious Services: More than 4 times per year    Active Member of [employer] or Organizations: Yes    Attends Engineer, structural: More than 4 times per year    Marital Status: Divorced    Allergies:  Allergies   Allergen Reactions   Egg-Derived Products Nausea And Vomiting and Other (See Comments)   Influenza Vaccines Nausea And Vomiting and Other (See Comments)   Other Nausea And Vomiting and Other (See Comments)    Egg Flu vaccine if it has egg derivative in it    Metabolic Disorder Labs: No results found for: "HGBA1C", "MPG" No results found for: "PROLACTIN" Lab Results  Component Value Date   CHOL 194 11/11/2009   TRIG 80.0 11/11/2009   HDL 52.90 11/11/2009   CHOLHDL 4 11/11/2009   VLDL 16.0 11/11/2009   LDLCALC 125 (H) 11/11/2009   LDLCALC 126 (H) 11/10/2008   Lab  Results  Component Value Date   TSH 1.62 11/11/2009   TSH 1.88 11/10/2008    Therapeutic Level Labs: No results found for: "LITHIUM" No results found for: "VALPROATE" No results found for: "CBMZ"  Current Medications: Current Outpatient Medications  Medication Sig Dispense Refill   albuterol (VENTOLIN HFA) 108 (90 Base) MCG/ACT inhaler Inhale 2 puffs into the lungs every 4 (four) hours as needed for wheezing or shortness of breath (coughing fits). 18 g 3   alendronate (FOSAMAX) 70 MG tablet Take 70 mg by mouth once a week.     ALPRAZolam (XANAX) 0.25 MG tablet Take 1-2 tablets (0.25-0.5 mg total) by mouth 2 (two) times daily as needed for anxiety. 60 tablet 0   Alum Hydroxide-Mag Trisilicate (GAVISCON) 80-14.2 MG CHEW 2 tablets after meals and at bedtime as needed     amLODipine (NORVASC) 5 MG tablet Take 5 mg by mouth daily after breakfast.      cetirizine (ZYRTEC) 10 MG tablet Take 1 tablet (10 mg total) by mouth daily. 30 tablet 5   cholecalciferol (VITAMIN D3) 25 MCG (1000 UT) tablet Take 1,000 Units by mouth daily after breakfast.      cyanocobalamin 1000 MCG tablet Take by mouth.     dorzolamide (TRUSOPT) 2 % ophthalmic solution Place 1 drop into both eyes 2 (two) times daily.     DULoxetine (CYMBALTA) 30 MG capsule Take 1 capsule (30 mg total) by mouth daily. 14 capsule 0   DULoxetine (CYMBALTA) 60 MG  capsule Take 1 capsule (60 mg total) by mouth daily. Start this after taking the 30 mg daily for 2 weeks. 30 capsule 1   FLUoxetine (PROZAC) 20 MG tablet Take 40 mg by mouth daily.     fluticasone-salmeterol (ADVAIR HFA) 230-21 MCG/ACT inhaler Inhale 2 puffs into the lungs 2 (two) times daily. 1 each 12   furosemide (LASIX) 20 MG tablet Take 20 mg by mouth daily. As needed     hydrochlorothiazide (HYDRODIURIL) 25 MG tablet      hydroxychloroquine (PLAQUENIL) 200 MG tablet See admin instructions.     latanoprost (XALATAN) 0.005 % ophthalmic solution Place 1 drop into both eyes at bedtime.     metoprolol succinate (TOPROL-XL) 100 MG 24 hr tablet Take 100 mg by mouth daily after breakfast. Take with or immediately following a meal.     Oyster Shell Calcium 500 MG TABS 1 tablet with meals     telmisartan (MICARDIS) 80 MG tablet Take 80 mg by mouth daily after breakfast.     Vitamin D-Vitamin K (VITAMIN K2-VITAMIN D3) 45-2000 MCG-UNIT CAPS 1 tablet     folic acid (FOLVITE) 1 MG tablet Take 1 mg by mouth daily after breakfast.  (Patient not taking: Reported on 09/08/2022)     ibandronate (BONIVA) 150 MG tablet Take 150 mg by mouth every 30 (thirty) days. (Patient not taking: Reported on 09/08/2022)     methotrexate (RHEUMATREX) 2.5 MG tablet Take 12.5 mg by mouth every Monday. (Patient not taking: Reported on 09/08/2022)     SUMAtriptan (IMITREX) 100 MG tablet Take 100 mg by mouth every 2 (two) hours as needed for migraine. May repeat in 2 hours if headache persists or recurs.     No current facility-administered medications for this visit.    Medication Side Effects: none  Orders placed this visit:  No orders of the defined types were placed in this encounter.   Psychiatric Specialty Exam:  Review of Systems  Blood pressure 125/75, pulse 76, height  4\' 10"  (1.473 m), weight 161 lb (73 kg).Body mass index is 33.65 kg/m.  General Appearance: Casual and Well Groomed  Eye Contact:  Good  Speech:   Clear and Coherent and Normal Rate  Volume:  Normal  Mood:   sad  Affect:  Congruent  Thought Process:  Goal Directed and Descriptions of Associations: Circumstantial  Orientation:  Full (Time, Place, and Person)  Thought Content: Logical   Suicidal Thoughts:  No  Homicidal Thoughts:  No  Memory:  WNL  Judgement:  Good  Insight:  Good  Psychomotor Activity:  Normal  Concentration:  Concentration: Good  Recall:  Good  Fund of Knowledge: Good  Language: Good  Assets:  Desire for Improvement Financial Resources/Insurance Resilience Transportation  ADL's:  Intact  Cognition: WNL  Prognosis:  Good   Screenings:  GAD-7    Flowsheet Row Counselor from 09/08/2022 in [facility]  Total GAD-7 Score 19      PHQ2-9    Flowsheet Row Counselor from 09/08/2022 in [facility]  PHQ-2 Total Score 4  PHQ-9 Total Score 18      Receiving Psychotherapy: Yes  with Adalberto Ill, [facility], she started with her today  Treatment Plan/Recommendations:  PDMP reviewed.  No controlled substances listed. I provided 65 minutes of face to face time during this encounter, including time spent before and after the visit in records review, medical decision making, counseling pertinent to today's visit, and charting.   We discussed her symptoms.  She has clinical depression, no longer treated well with the Prozac.  We could increase the dose of that, however she has been on as much as 60 mg in the past without improvement of symptoms.  She is not sure how long ago that was however.  She has been on Prozac ever since it came out in the late 80s.  Another option would be to change to a different SSRI.  Benefits of that would be to help with the depression but also anxiety.  Changing to an SNRI is another option, which I I recommend.  Cymbalta not only helps depression and anxiety, it can help with chronic pain as well.  She does not have a lot of pain from  the rheumatoid arthritis but she does sometimes and the Cymbalta could help with that.  Benefits, risks and side effects were discussed and she accepts.  I recommend restarting Xanax.  She took it long ago and it was helpful.  We discussed the short-term and long-term risks of benzodiazepines including sedation, increased risk of falling, dizziness, tolerance, and addictive potential.  Patient understands and accepts.   Discontinue Prozac. Start Xanax 0.25 mg, 1-2 twice daily as needed anxiety. Start Cymbalta 30 mg daily for 2 weeks then increase to 60 mg daily. Continue vitamin D, recommend a multivitamin, fish oil, and B complex. Continue therapy with Adalberto Ill, [facility]. Return in 6 weeks.  Melony Overly, PA-C
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
## 2022-09-09 ENCOUNTER — Encounter: Payer: Self-pay | Admitting: Professional Counselor

## 2022-09-09 NOTE — Patient Instructions (Signed)
Pt to return in one week Schedule psychiatric appt within practice for medication consult

## 2022-09-22 ENCOUNTER — Ambulatory Visit: Payer: Medicare HMO | Admitting: Professional Counselor

## 2022-09-22 ENCOUNTER — Encounter: Payer: Self-pay | Admitting: Professional Counselor

## 2022-09-22 DIAGNOSIS — F411 Generalized anxiety disorder: Secondary | ICD-10-CM | POA: Diagnosis not present

## 2022-09-22 DIAGNOSIS — F331 Major depressive disorder, recurrent, moderate: Secondary | ICD-10-CM

## 2022-09-22 NOTE — Patient Instructions (Signed)
Return for follow up in one week.

## 2022-09-22 NOTE — Progress Notes (Signed)
      [facility] Counselor/Therapist Progress Note  Patient ID: Rhonda Bush, MRN: [id number],    Date: 09/22/2022  Time Spent: 9:09a - 10:05a   Treatment Type: Individual Therapy  Reported Symptoms: worries, low mood, grief/loss, restlessness  Mental Status Exam:  Appearance:   Neat     Behavior:  Appropriate, Sharing, and Motivated  Motor:  Normal  Speech/Language:   Normal Rate  Affect:  Tearful at times   Mood:  normal, grief rxn  Thought process:  normal  Thought content:    WNL  Sensory/Perceptual disturbances:    WNL  Orientation:  oriented to person, place, time/date, and situation  Attention:  Good  Concentration:  Good  Memory:  WNL  Fund of knowledge:   Good  Insight:    Good  Judgment:   Good  Impulse Control:  Good   Risk Assessment: Danger to Self:  No Self-injurious Behavior: No Danger to Others: No Duty to Warn:no Physical Aggression / Violence:No  Access to Firearms a concern: No  Gang Involvement:No   Subjective: Pt presented to session expressing Cymbalta to be helpful in alleviating depressive and anxious symptoms, and noticed herself smiling more since taking for fourteen days now. She and counselor discussed how her PHQ9 (17) and GAD7 (16) scores remained elevated, however. Pt processed low feelings about herself and diminished sense of life accomplishment, and worry and fear around loved ones being far from home, for she feels they might need her and she is far away. Pt also expressed fears regarding aging alone. Pt and counselor discussed pt experience of grief and loss, and pt difficulty in letting go of guilt and in accepting loss. Pt identified how she feels her losses may have contributed to her fear of getting close to others too readily. Counselor affirmed pt and held space for grief, helped facilitate insight and provided support. Counselor and pt discussed pt revised treatment plan and pt gave her consent.  Interventions:  Humanistic/Existential and Grief Therapy  Diagnosis:   ICD-10-CM   1. Major depressive disorder, recurrent episode, moderate (HCC)  F33.1     2. Generalized anxiety disorder  F41.1       Plan: Pt to return in a week. Continue to build rapport, assist in process work and help develop coping skills. Continue to assist pt with medication tracking.  Gaspar Bidding, [facility]

## 2022-09-25 ENCOUNTER — Encounter: Payer: Self-pay | Admitting: Professional Counselor

## 2022-09-30 ENCOUNTER — Other Ambulatory Visit: Payer: Self-pay | Admitting: Physician Assistant

## 2022-10-12 ENCOUNTER — Encounter: Payer: Self-pay | Admitting: Professional Counselor

## 2022-10-12 ENCOUNTER — Ambulatory Visit (INDEPENDENT_AMBULATORY_CARE_PROVIDER_SITE_OTHER): Payer: Medicare HMO | Admitting: Professional Counselor

## 2022-10-12 DIAGNOSIS — F411 Generalized anxiety disorder: Secondary | ICD-10-CM | POA: Diagnosis not present

## 2022-10-12 DIAGNOSIS — F331 Major depressive disorder, recurrent, moderate: Secondary | ICD-10-CM
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
## 2022-10-24 ENCOUNTER — Encounter: Payer: Self-pay | Admitting: Physician Assistant

## 2022-10-24 ENCOUNTER — Ambulatory Visit (INDEPENDENT_AMBULATORY_CARE_PROVIDER_SITE_OTHER): Payer: Medicare HMO | Admitting: Physician Assistant

## 2022-10-24 DIAGNOSIS — F331 Major depressive disorder, recurrent, moderate: Secondary | ICD-10-CM | POA: Diagnosis not present

## 2022-10-24 DIAGNOSIS — F411 Generalized anxiety disorder: Secondary | ICD-10-CM | POA: Diagnosis not present

## 2022-10-24 NOTE — Progress Notes (Signed)
[facility] Med Check  Patient ID: Rhonda Bush,  MRN: [id number]  PCP: Deatra James, MD  Date of Evaluation: 10/24/2022 Time spent:20 minutes  Chief Complaint:  Chief Complaint   Anxiety; Depression; Follow-up    HISTORY/CURRENT STATUS: HPI For 6 week med check  We d/c Prozac at the last visit and started Cymbalta.  She has been on 60 mg for almost a month now.  States she is feeling better, but she did have 1 episode a few weeks ago where she was depressed for a few days.  It got to the point where she went into her "safe space" which is her bedroom.  There was no trigger that she could think of.  It was almost like a switch flip off and then on again.  Other than that she has been feeling well though.  She has been able to enjoy things.  Energy and motivation have been good for the most part.  She is sleeping well.  ADLs and personal hygiene are normal.  Appetite is normal and weight is stable.  No suicidal or homicidal thoughts.  She is still having some anxiety, describes as "nervous" like something bad may happen.  No panic attacks though.  She has not taken the Xanax at all.  Patient denies increased energy with decreased need for sleep, increased talkativeness, racing thoughts, impulsivity or risky behaviors, increased spending, increased libido, grandiosity, increased irritability or anger, paranoia, or hallucinations.  Denies dizziness, syncope, seizures, numbness, tingling, tremor, tics, unsteady gait, slurred speech, confusion. Denies muscle or joint pain, stiffness, or dystonia.  Individual Medical History/ Review of Systems: Changes? :No   Past medications for mental health diagnoses include: Prozac, Xanax for 1 month back in the 80s.   Allergies: Egg-derived products, Influenza vaccines, and Other  Current Medications:  Current Outpatient Medications:    albuterol (VENTOLIN HFA) 108 (90 Base) MCG/ACT inhaler, Inhale 2 puffs into the lungs every 4 (four) hours  as needed for wheezing or shortness of breath (coughing fits)., Disp: 18 g, Rfl: 3   alendronate (FOSAMAX) 70 MG tablet, Take 70 mg by mouth once a week., Disp: , Rfl:    ALPRAZolam (XANAX) 0.25 MG tablet, Take 1-2 tablets (0.25-0.5 mg total) by mouth 2 (two) times daily as needed for anxiety., Disp: 60 tablet, Rfl: 0   Alum Hydroxide-Mag Trisilicate (GAVISCON) 80-14.2 MG CHEW, 2 tablets after meals and at bedtime as needed, Disp: , Rfl:    amLODipine (NORVASC) 5 MG tablet, Take 5 mg by mouth daily after breakfast. , Disp: , Rfl:    cetirizine (ZYRTEC) 10 MG tablet, Take 1 tablet (10 mg total) by mouth daily., Disp: 30 tablet, Rfl: 5   cholecalciferol (VITAMIN D3) 25 MCG (1000 UT) tablet, Take 1,000 Units by mouth daily after breakfast. , Disp: , Rfl:    cyanocobalamin 1000 MCG tablet, Take by mouth., Disp: , Rfl:    dorzolamide (TRUSOPT) 2 % ophthalmic solution, Place 1 drop into both eyes 2 (two) times daily., Disp: , Rfl:    DULoxetine (CYMBALTA) 60 MG capsule, Take 1 capsule (60 mg total) by mouth daily. Start this after taking the 30 mg daily for 2 weeks., Disp: 30 capsule, Rfl: 1   fluticasone-salmeterol (ADVAIR HFA) 230-21 MCG/ACT inhaler, Inhale 2 puffs into the lungs 2 (two) times daily., Disp: 1 each, Rfl: 12   furosemide (LASIX) 20 MG tablet, Take 20 mg by mouth daily. As needed, Disp: , Rfl:    hydrochlorothiazide (HYDRODIURIL) 25 MG tablet, ,  Disp: , Rfl:    hydroxychloroquine (PLAQUENIL) 200 MG tablet, See admin instructions., Disp: , Rfl:    latanoprost (XALATAN) 0.005 % ophthalmic solution, Place 1 drop into both eyes at bedtime., Disp: , Rfl:    metoprolol succinate (TOPROL-XL) 100 MG 24 hr tablet, Take 100 mg by mouth daily after breakfast. Take with or immediately following a meal., Disp: , Rfl:    Oyster Shell Calcium 500 MG TABS, 1 tablet with meals, Disp: , Rfl:    telmisartan (MICARDIS) 80 MG tablet, Take 80 mg by mouth daily after breakfast., Disp: , Rfl:    Vitamin  D-Vitamin K (VITAMIN K2-VITAMIN D3) 45-2000 MCG-UNIT CAPS, 1 tablet, Disp: , Rfl:    folic acid (FOLVITE) 1 MG tablet, Take 1 mg by mouth daily after breakfast.  (Patient not taking: Reported on 09/08/2022), Disp: , Rfl:    ibandronate (BONIVA) 150 MG tablet, Take 150 mg by mouth every 30 (thirty) days. (Patient not taking: Reported on 09/08/2022), Disp: , Rfl:    methotrexate (RHEUMATREX) 2.5 MG tablet, Take 12.5 mg by mouth every Monday. (Patient not taking: Reported on 09/08/2022), Disp: , Rfl:  Medication Side Effects: none  Family Medical/ Social History: Changes? No  MENTAL HEALTH EXAM:  There were no vitals taken for this visit.There is no height or weight on file to calculate BMI.  General Appearance: Casual and Well Groomed  Eye Contact:  Good  Speech:  Clear and Coherent and Normal Rate  Volume:  Normal  Mood:  Euthymic  Affect:  Congruent  Thought Process:  Goal Directed and Descriptions of Associations: Circumstantial  Orientation:  Full (Time, Place, and Person)  Thought Content: Logical   Suicidal Thoughts:  No  Homicidal Thoughts:  No  Memory:  WNL  Judgement:  Good  Insight:  Good  Psychomotor Activity:  Normal  Concentration:  Concentration: Good  Recall:  Good  Fund of Knowledge: Good  Language: Good  Assets:  Desire for Improvement Financial Resources/Insurance Housing Transportation  ADL's:  Intact  Cognition: WNL  Prognosis:  Good   DIAGNOSES:    ICD-10-CM   1. Major depressive disorder, recurrent episode, moderate (HCC)  F33.1     2. Generalized anxiety disorder  F41.1       Receiving Psychotherapy: Yes   with Lieutenant Diego, [facility]  RECOMMENDATIONS:  PDMP reviewed.  Xanax filled 09/08/2022. I provided 20 minutes of face to face time during this encounter, including time spent before and after the visit in records review, medical decision making, counseling pertinent to today's visit, and charting.   I am glad to see her doing better and feel like  we are on the right path.  She has only been on the current dose of Cymbalta for almost 4 weeks so I recommend she stay on that for another 2 weeks.  If she has another episode like the depression she described above for another 2 weeks.  If she has another episode like the depression she described above then I would increase Cymbalta to 90 mg.  If not though we will stay at 60 mg. Encouraged her to take the Xanax if the anxiety is overwhelming or definitely for a panic attack if 1 should occur.  Continue Xanax 0.25 mg, 1-2 p.o. twice daily as needed. Continue Cymbalta 60 mg, 1 p.o. daily. Continue therapy with Adalberto Ill, [facility]. Return in 6 weeks.  Melony Overly, PA-C

## 2022-10-27 ENCOUNTER — Encounter: Payer: Self-pay | Admitting: Professional Counselor

## 2022-10-27 ENCOUNTER — Ambulatory Visit (INDEPENDENT_AMBULATORY_CARE_PROVIDER_SITE_OTHER): Payer: Medicare HMO | Admitting: Professional Counselor

## 2022-10-27 DIAGNOSIS — F331 Major depressive disorder, recurrent, moderate: Secondary | ICD-10-CM

## 2022-10-27 DIAGNOSIS — F411 Generalized anxiety disorder: Secondary | ICD-10-CM

## 2022-10-27 NOTE — Progress Notes (Signed)
      [facility] Counselor/Therapist Progress Note  Patient ID: Rhonda Bush, MRN: [id number],    Date: 10/27/2022  Time Spent: 9:10a - 10:04a   Treatment Type: Individual Therapy  Reported Symptoms: low mood, regrets, fears, stress, panic attack  Mental Status Exam:  Appearance:   Neat     Behavior:  Appropriate, Sharing, and Motivated  Motor:  Normal  Speech/Language:   Clear and Coherent and Normal Rate  Affect:  Appropriate and Congruent  Mood:  normal  Thought process:  normal  Thought content:    WNL  Sensory/Perceptual disturbances:    WNL  Orientation:  oriented to person, place, time/date, and situation  Attention:  Good  Concentration:  Good  Memory:  WNL  Fund of knowledge:   Good  Insight:    Good  Judgment:   Good  Impulse Control:  Good   Risk Assessment: Danger to Self:  No Self-injurious Behavior: No Danger to Others: No Duty to Warn:no Physical Aggression / Violence:No  Access to Firearms a concern: No  Gang Involvement:No   Subjective: Pt presented to session reporting having an episode of persistent depression that lasted several days; she reported it having resolved but for lighter symptoms of depression to continue. 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 Pt reported reading book regarding freeing oneself from regrets; counselor facilitated discussion of pt carrying of regrets and helped pt resource self compassion. Pt voiced experience of recent panic attack; counselor taught pt relaxation techniques a strategies for heightened anxiety.  Interventions: Solution-Oriented/Positive Psychology, Humanistic/Existential, and Insight-Oriented  Diagnosis:   ICD-10-CM   1. Major depressive disorder, recurrent episode, moderate (HCC)  F33.1     2. Generalized anxiety disorder  F41.1       Plan: Pt is scheduled for a follow-up; continue process  work and developing coping skills.  Gaspar Bidding, [facility]

## 2022-10-31 DIAGNOSIS — H402231 Chronic angle-closure glaucoma, bilateral, mild stage: Secondary | ICD-10-CM | POA: Diagnosis not present

## 2022-10-31 DIAGNOSIS — H04123 Dry eye syndrome of bilateral lacrimal glands: Secondary | ICD-10-CM | POA: Diagnosis not present

## 2022-11-01 DIAGNOSIS — L821 Other seborrheic keratosis: Secondary | ICD-10-CM | POA: Diagnosis not present

## 2022-11-01 NOTE — Progress Notes (Incomplete)
      [facility] Counselor/Therapist Progress Note  Patient ID: Rhonda Bush, MRN: [id number],    Date: 10/27/2022  Time Spent: 9:10a - 10:04a   Treatment Type: Individual Therapy  Reported Symptoms: low mood, regrets, fears, stress, panic attack  Mental Status Exam:  Appearance:   Neat     Behavior:  Appropriate, Sharing, and Motivated  Motor:  Normal  Speech/Language:   Clear and Coherent and Normal Rate  Affect:  Appropriate and Congruent  Mood:  normal  Thought process:  normal  Thought content:    WNL  Sensory/Perceptual disturbances:    WNL  Orientation:  oriented to person, place, time/date, and situation  Attention:  Good  Concentration:  Good  Memory:  WNL  Fund of knowledge:   Good  Insight:    Good  Judgment:   Good  Impulse Control:  Good   Risk Assessment: Danger to Self:  No Self-injurious Behavior: No Danger to Others: No Duty to Warn:no Physical Aggression / Violence:No  Access to Firearms a concern: No  Gang Involvement:No   Subjective: Pt presneted to session reporting havng an epsiode of persistent deprssin that lasted sevral days; she reportedit having rsolved but for lighter symtpoms of depresion to contiune. Pt idientifiied implemtnign boundaries with  Interventions: Solution-Oriented/Positive Psychology, Humanistic/Existential, and Insight-Oriented  Diagnosis:   ICD-10-CM   1. Major depressive disorder, recurrent episode, moderate (HCC)  F33.1     2. Generalized anxiety disorder  F41.1       Plan: ***  Gaspar Bidding, [facility]

## 2022-11-10 ENCOUNTER — Encounter: Payer: Self-pay | Admitting: Professional Counselor

## 2022-11-10 ENCOUNTER — Ambulatory Visit (INDEPENDENT_AMBULATORY_CARE_PROVIDER_SITE_OTHER): Payer: Medicare HMO | Admitting: Professional Counselor

## 2022-11-10 DIAGNOSIS — F411 Generalized anxiety disorder: Secondary | ICD-10-CM | POA: Diagnosis not present

## 2022-11-10 DIAGNOSIS — F331 Major depressive disorder, recurrent, moderate: Secondary | ICD-10-CM

## 2022-11-10 NOTE — Progress Notes (Signed)
      [facility] Counselor/Therapist Progress Note  Patient ID: Rhonda Bush, MRN: [id number],    Date: 11/10/2022  Time Spent: 9:12a - 10:11a   Treatment Type: Individual Therapy  Reported Symptoms: malaise, worries, preoccupying thoughts, frustration, interpersonal concerns   Mental Status Exam:  Appearance:   Neat     Behavior:  Appropriate and Sharing  Motor:  Normal  Speech/Language:   Clear and Coherent and Normal Rate  Affect:  Appropriate and Congruent  Mood:  normal  Thought process:  normal  Thought content:    WNL  Sensory/Perceptual disturbances:    WNL  Orientation:  oriented to person, place, time/date, and situation  Attention:  Good  Concentration:  Good  Memory:  WNL  Fund of knowledge:   Good  Insight:    Good  Judgment:   Good  Impulse Control:  Good   Risk Assessment: Danger to Self:  No Self-injurious Behavior: No Danger to Others: No Duty to Warn:no Physical Aggression / Violence:No  Access to Firearms a concern: No  Gang Involvement:No   Subjective: Pt presented to session voicing experience of sadness recently and a sense of emptiness, without low self esteem. She voiced having difficulty motivating to do house chores and get her car washed, tasks of that nature. Pt scored 10 for GAD7 and 6 for PHQ9. Counselor discussed with pt follow-up with prescribing provider to be proactive in preventing symptoms worsening. Pt reported using her coping skills from counseling and for them to be helpful; she identified utilizing the self soothing relaxation technique practiced in session, and engaging in positive self talk. Counselor facilitated the daily examen visualization and reflection intervention; pt engaged and voiced it to be helpful. Pt processed events both life-giving and life-draining, and worked to accept both and herself in context with compassion; she practiced deep breathing. Pt processed additional events whereby she was activated and remained  calm, progress from past response patterns. Pt identified short-term goals until next session.   Interventions: Solution-Oriented/Positive Psychology, Humanistic/Existential, and Insight-Oriented  Diagnosis:   ICD-10-CM   1. Major depressive disorder, recurrent episode, moderate (HCC)  F33.1     2. Generalized anxiety disorder  F41.1       Plan: Pt is scheduled for a follow-up; continue process work and developing coping skills.   Gaspar Bidding, [facility]

## 2022-11-11 ENCOUNTER — Other Ambulatory Visit: Payer: Self-pay | Admitting: Physician Assistant

## 2022-11-11 ENCOUNTER — Telehealth: Payer: Self-pay | Admitting: Physician Assistant

## 2022-11-11 MED ORDER — DULOXETINE HCL 30 MG PO CPEP: 90.0000 mg | ORAL_CAPSULE | Freq: Every day | ORAL | 1 refills | Status: DC

## 2022-11-11 NOTE — Telephone Encounter (Signed)
Patient saw Tamela Oddi yesterday and it was thought that Cymbalta might need to be increased. I called patient and she said she had just been "down and out a lot lately". No new stressors. Sleeping well.   Ph[facility]acy - [facility] [address]

## 2022-11-11 NOTE — Telephone Encounter (Signed)
Pt LVM 4:36p on 8/29.  She said after her session with [facility] today, they discussed pt might need to increase her dosage of Cymbalta.   She said she would like to do that.  If Rosey Bath will send in the script, pls send it to   [id number],  - [address], [place] [address] Phone: [phone]  Fax: [phone]   Next appt 9/23

## 2022-11-11 NOTE — Telephone Encounter (Signed)
Patient notified

## 2022-11-11 NOTE — Telephone Encounter (Signed)
Patient and I discussed this at the last visit.  Please let her know I sent in a prescription for Cymbalta 30 mg, she will take 3 daily.

## 2022-11-13 ENCOUNTER — Other Ambulatory Visit: Payer: Self-pay | Admitting: Physician Assistant

## 2022-11-22 DIAGNOSIS — F330 Major depressive disorder, recurrent, mild: Secondary | ICD-10-CM | POA: Diagnosis not present

## 2022-11-22 DIAGNOSIS — I10 Essential (primary) hypertension: Secondary | ICD-10-CM | POA: Diagnosis not present

## 2022-11-22 DIAGNOSIS — M25511 Pain in right shoulder: Secondary | ICD-10-CM | POA: Diagnosis not present

## 2022-11-24 ENCOUNTER — Encounter: Payer: Self-pay | Admitting: Professional Counselor

## 2022-11-24 ENCOUNTER — Ambulatory Visit: Payer: Medicare HMO | Admitting: Professional Counselor

## 2022-11-24 DIAGNOSIS — M810 Age-related osteoporosis without current pathological fracture: Secondary | ICD-10-CM | POA: Diagnosis not present

## 2022-11-24 DIAGNOSIS — F3341 Major depressive disorder, recurrent, in partial remission: Secondary | ICD-10-CM | POA: Diagnosis not present

## 2022-11-24 DIAGNOSIS — D72819 Decreased white blood cell count, unspecified: Secondary | ICD-10-CM | POA: Diagnosis not present

## 2022-11-24 DIAGNOSIS — F411 Generalized anxiety disorder: Secondary | ICD-10-CM

## 2022-11-24 DIAGNOSIS — Z79899 Other long term (current) drug therapy: Secondary | ICD-10-CM | POA: Diagnosis not present

## 2022-11-24 DIAGNOSIS — M0579 Rheumatoid arthritis with rheumatoid factor of multiple sites without organ or systems involvement: Secondary | ICD-10-CM | POA: Diagnosis not present

## 2022-11-24 NOTE — Progress Notes (Signed)
      [facility] Counselor/Therapist Progress Note  Patient ID: Rhonda Bush, MRN: [id number],    Date: 11/24/2022  Time Spent: 11:04a - 12:02p  Treatment Type: Individual Therapy  Reported Symptoms: stress, worries, restlessness  Mental Status Exam:  Appearance:   Neat     Behavior:  Appropriate, Sharing, and Motivated  Motor:  Normal  Speech/Language:   Clear and Coherent and Normal Rate  Affect:  Appropriate and Congruent  Mood:  normal  Thought process:  normal  Thought content:    WNL  Sensory/Perceptual disturbances:    WNL  Orientation:  oriented to person, place, time/date, and situation  Attention:  Good  Concentration:  Good  Memory:  WNL  Fund of knowledge:   Good  Insight:    Good  Judgment:   Good  Impulse Control:  Good   Risk Assessment: Danger to Self:  No Self-injurious Behavior: No Danger to Others: No Duty to Warn:no Physical Aggression / Violence:No  Access to Firearms a concern: No  Gang Involvement:No   Subjective: Pt presented to session reporting overall improvement of symptomology for the past week. She voiced one occasion when she noticed feeling particularly upset, but she noted that her self advocacy around the situation helped. Counselor affirmed pt experience and her standing up for herself. Pt and counselor discussed another area of pt voicing need to self advocate, and pt voiced intention to do so with a support person present. They discussed pt strategies for meeting, and her desired outcome to have her needs met; counselor reinforced pt emotional safety. Pt identified her ongoing volunteer work as very helpful in her mitigation of symptoms, in staying busy and doing for others. Counselor actively listened, helped facilitate insight and strategies and assisted with resouricng.  Interventions: Solution-Oriented/Positive Psychology, Humanistic/Existential, and Insight-Oriented  Diagnosis:   ICD-10-CM   1. Major depressive disorder,  recurrent episode, in partial remission (HCC)  F33.41     2. Generalized anxiety disorder  F41.1       Plan: Pt is scheduled for a follow-up; continue process work and developing coping skills.   Gaspar Bidding, [facility]

## 2022-12-01 DIAGNOSIS — H04123 Dry eye syndrome of bilateral lacrimal glands: Secondary | ICD-10-CM | POA: Diagnosis not present

## 2022-12-01 DIAGNOSIS — H402231 Chronic angle-closure glaucoma, bilateral, mild stage: Secondary | ICD-10-CM | POA: Diagnosis not present

## 2022-12-03 ENCOUNTER — Other Ambulatory Visit: Payer: Self-pay | Admitting: Physician Assistant

## 2022-12-04 NOTE — Telephone Encounter (Signed)
Has appt. tomorrow

## 2022-12-05 ENCOUNTER — Encounter: Payer: Self-pay | Admitting: Physician Assistant

## 2022-12-05 ENCOUNTER — Ambulatory Visit: Payer: Medicare HMO | Admitting: Physician Assistant

## 2022-12-05 ENCOUNTER — Other Ambulatory Visit: Payer: Self-pay | Admitting: Family Medicine

## 2022-12-05 DIAGNOSIS — F411 Generalized anxiety disorder: Secondary | ICD-10-CM | POA: Diagnosis not present

## 2022-12-05 DIAGNOSIS — Z1231 Encounter for screening mammogram for malignant neoplasm of breast: Secondary | ICD-10-CM

## 2022-12-05 DIAGNOSIS — F3341 Major depressive disorder, recurrent, in partial remission: Secondary | ICD-10-CM | POA: Diagnosis not present

## 2022-12-05 DIAGNOSIS — M069 Rheumatoid arthritis, unspecified: Secondary | ICD-10-CM

## 2022-12-05 MED ORDER — DULOXETINE HCL 30 MG PO CPEP: 90.0000 mg | ORAL_CAPSULE | Freq: Every day | ORAL | 0 refills | Status: DC

## 2022-12-05 NOTE — Progress Notes (Signed)
[facility] Med Check  Patient ID: Rhonda Bush,  MRN: [id number]  PCP: Deatra James, MD  Date of Evaluation: 12/05/2022 Time spent:20 minutes  Chief Complaint:  Chief Complaint   Anxiety; Depression; Follow-up    HISTORY/CURRENT STATUS: HPI For 6 week med check  Rhonda Bush states she is doing very well.  She has taken the Xanax only on 2 occasions.  It has been helpful.  She is still hesitant to take it but does not feel like she needs that often now anyway.  The Cymbalta has helped with the anxiety.  Not having panic attacks and does not get as overwhelmed as she had been.  Patient is able to enjoy things.  Energy and motivation are good.  States she feels more like doing things than she was prior to taking the Cymbalta.  Sometimes she has a "hump" to get over but it is still easier and once she has gotten going she does not have any trouble.  No extreme sadness, tearfulness, or feelings of hopelessness.  Sleeps well most of the time. ADLs and personal hygiene are normal.   Denies any changes in concentration, making decisions, or remembering things.  Appetite has not changed.  Weight is stable.  Denies suicidal or homicidal thoughts.  Patient denies increased energy with decreased need for sleep, increased talkativeness, racing thoughts, impulsivity or risky behaviors, increased spending, increased libido, grandiosity, increased irritability or anger, paranoia, or hallucinations.  Denies dizziness, syncope, seizures, numbness, tingling, tremor, tics, unsteady gait, slurred speech, confusion. Denies muscle or joint pain, stiffness, or dystonia.  Individual Medical History/ Review of Systems: Changes? :No   Past medications for mental health diagnoses include: Prozac, Xanax for 1 month back in the 80s.   Allergies: Egg-derived products, Influenza vaccines, and Other  Current Medications:  Current Outpatient Medications:    albuterol (VENTOLIN HFA) 108 (90 Base) MCG/ACT inhaler,  Inhale 2 puffs into the lungs every 4 (four) hours as needed for wheezing or shortness of breath (coughing fits)., Disp: 18 g, Rfl: 3   alendronate (FOSAMAX) 70 MG tablet, Take 70 mg by mouth once a week., Disp: , Rfl:    ALPRAZolam (XANAX) 0.25 MG tablet, Take 1-2 tablets (0.25-0.5 mg total) by mouth 2 (two) times daily as needed for anxiety., Disp: 60 tablet, Rfl: 0   Alum Hydroxide-Mag Trisilicate (GAVISCON) 80-14.2 MG CHEW, 2 tablets after meals and at bedtime as needed, Disp: , Rfl:    amLODipine (NORVASC) 5 MG tablet, Take 5 mg by mouth daily after breakfast. , Disp: , Rfl:    cetirizine (ZYRTEC) 10 MG tablet, Take 1 tablet (10 mg total) by mouth daily., Disp: 30 tablet, Rfl: 5   cholecalciferol (VITAMIN D3) 25 MCG (1000 UT) tablet, Take 1,000 Units by mouth daily after breakfast. , Disp: , Rfl:    cyanocobalamin 1000 MCG tablet, Take by mouth., Disp: , Rfl:    dorzolamide (TRUSOPT) 2 % ophthalmic solution, Place 1 drop into both eyes 2 (two) times daily., Disp: , Rfl:    fluticasone-salmeterol (ADVAIR HFA) 230-21 MCG/ACT inhaler, Inhale 2 puffs into the lungs 2 (two) times daily., Disp: 1 each, Rfl: 12   furosemide (LASIX) 20 MG tablet, Take 20 mg by mouth daily. As needed, Disp: , Rfl:    hydrochlorothiazide (HYDRODIURIL) 25 MG tablet, , Disp: , Rfl:    hydroxychloroquine (PLAQUENIL) 200 MG tablet, See admin instructions., Disp: , Rfl:    latanoprost (XALATAN) 0.005 % ophthalmic solution, Place 1 drop into both eyes at bedtime.,  Disp: , Rfl:    metoprolol succinate (TOPROL-XL) 100 MG 24 hr tablet, Take 100 mg by mouth daily after breakfast. Take with or immediately following a meal., Disp: , Rfl:    Oyster Shell Calcium 500 MG TABS, 1 tablet with meals, Disp: , Rfl:    telmisartan (MICARDIS) 80 MG tablet, Take 80 mg by mouth daily after breakfast., Disp: , Rfl:    Vitamin D-Vitamin K (VITAMIN K2-VITAMIN D3) 45-2000 MCG-UNIT CAPS, 1 tablet, Disp: , Rfl:    DULoxetine (CYMBALTA) 30 MG  capsule, Take 3 capsules (90 mg total) by mouth daily., Disp: 270 capsule, Rfl: 0   folic acid (FOLVITE) 1 MG tablet, Take 1 mg by mouth daily after breakfast.  (Patient not taking: Reported on 09/08/2022), Disp: , Rfl:    ibandronate (BONIVA) 150 MG tablet, Take 150 mg by mouth every 30 (thirty) days. (Patient not taking: Reported on 09/08/2022), Disp: , Rfl:    methotrexate (RHEUMATREX) 2.5 MG tablet, Take 12.5 mg by mouth every Monday. (Patient not taking: Reported on 09/08/2022), Disp: , Rfl:  Medication Side Effects: none  Family Medical/ Social History: Changes? No  MENTAL HEALTH EXAM:  There were no vitals taken for this visit.There is no height or weight on file to calculate BMI.  General Appearance: Casual and Well Groomed  Eye Contact:  Good  Speech:  Clear and Coherent and Normal Rate  Volume:  Normal  Mood:  Euthymic  Affect:  Congruent  Thought Process:  Goal Directed and Descriptions of Associations: Circumstantial  Orientation:  Full (Time, Place, and Person)  Thought Content: Logical   Suicidal Thoughts:  No  Homicidal Thoughts:  No  Memory:  WNL  Judgement:  Good  Insight:  Good  Psychomotor Activity:  Normal  Concentration:  Concentration: Good  Recall:  Good  Fund of Knowledge: Good  Language: Good  Assets:  Communication Skills Desire for Improvement Financial Resources/Insurance Housing Resilience Transportation  ADL's:  Intact  Cognition: WNL  Prognosis:  Good   DIAGNOSES:    ICD-10-CM   1. Major depressive disorder, recurrent episode, in partial remission (HCC)  F33.41     2. Generalized anxiety disorder  F41.1     3. Rheumatoid arthritis, involving unspecified site, unspecified whether rheumatoid factor present (HCC)  M06.9      Receiving Psychotherapy: Yes   with Lieutenant Diego, [facility]  RECOMMENDATIONS:  PDMP reviewed.  Xanax filled 09/08/2022. I provided 20 minutes of face to face time during this encounter, including time spent before and  after the visit in records review, medical decision making, counseling pertinent to today's visit, and charting.   I am glad to see her doing so well!  No change in treatment needed.  Continue Xanax 0.25 mg, 1-2 p.o. twice daily as needed. Continue Cymbalta 90 mg daily. Continue therapy with Adalberto Ill, [facility]. Return in 3 months.  Melony Overly, PA-C

## 2022-12-08 ENCOUNTER — Ambulatory Visit: Payer: Medicare HMO | Admitting: Professional Counselor

## 2022-12-22 ENCOUNTER — Encounter: Payer: Self-pay | Admitting: Professional Counselor

## 2022-12-22 ENCOUNTER — Ambulatory Visit: Payer: Medicare HMO | Admitting: Professional Counselor

## 2022-12-22 DIAGNOSIS — F3341 Major depressive disorder, recurrent, in partial remission: Secondary | ICD-10-CM

## 2022-12-22 NOTE — Progress Notes (Signed)
      [facility] Counselor/Therapist Progress Note  Patient ID: Rhonda Bush, MRN: [id number],    Date: 12/22/2022  Time Spent: 9:10a - 10:06a   Treatment Type: Individual Therapy  Reported Symptoms: trouble concentrating, trouble relaxing, some preoccupying thoughts  Mental Status Exam:  Appearance:   Neat     Behavior:  Appropriate and Sharing  Motor:  Normal  Speech/Language:   Clear and Coherent and Normal Rate  Affect:  Appropriate and Congruent  Mood:  normal  Thought process:  normal  Thought content:    WNL  Sensory/Perceptual disturbances:    WNL  Orientation:  Sound  Attention:  Good  Concentration:  Good  Memory:  WNL  Fund of knowledge:   Good  Insight:    Good  Judgment:   Good  Impulse Control:  Good   Risk Assessment: Danger to Self:  No Self-injurious Behavior: No Danger to Others: No Duty to Warn:no Physical Aggression / Violence:No  Access to Firearms a concern: No  Gang Involvement:No   Subjective: Pt expressed exerieirnvg significant progress to depressive and anxious symptoms; counselor facilitated GAD7 and PHQ9 screeners and pt scored 1 for both. Counselor and pt reflected on gains since June when scores for screeners were 19 and 18, respectively. Pt identified practicing letting go of preoccupations, resourcing her inner strengths and coping skills, and leaning on her faith as helpful. Pt voiced recent revival, volunteerism, and self advocacy where housemate concerned as developments of benefit to life and mood, with some worry regarding a family stressor of her son's at this time. Pt voiced learning to recognize "signs of trouble" with her mood as her primary preventative coping mechanism. Counselor and pt discussed pt awareness around any increase in symptoms, and discussed pt reaching out with any changes she may notice. They discussed pt moving to monthly maintenance appointments to reinforce coping skills and to continue to track  progress.  Interventions: Solution-Oriented/Positive Psychology, Humanistic/Existential, and Insight-Oriented  Diagnosis:   ICD-10-CM   1. Major depressive disorder, recurrent episode, in partial remission (HCC)  F33.41       Plan: Pt to be seen in a month to track progress. Continue process work and Conservation officer, historic buildings.   Gaspar Bidding, [facility]

## 2023-01-05 ENCOUNTER — Ambulatory Visit: Payer: Medicare HMO | Admitting: Professional Counselor

## 2023-01-12 DIAGNOSIS — H402231 Chronic angle-closure glaucoma, bilateral, mild stage: Secondary | ICD-10-CM | POA: Diagnosis not present

## 2023-01-16 ENCOUNTER — Ambulatory Visit: Payer: Medicare HMO | Admitting: Professional Counselor

## 2023-01-23 ENCOUNTER — Ambulatory Visit
Admission: RE | Admit: 2023-01-23 | Discharge: 2023-01-23 | Disposition: A | Discharge status: Home / Self Care | Payer: Medicare HMO | Source: Ambulatory Visit | Attending: Family Medicine | Admitting: Family Medicine

## 2023-01-23 DIAGNOSIS — Z1231 Encounter for screening mammogram for malignant neoplasm of breast: Secondary | ICD-10-CM

## 2023-01-26 DIAGNOSIS — K5900 Constipation, unspecified: Secondary | ICD-10-CM | POA: Diagnosis not present

## 2023-01-26 DIAGNOSIS — K219 Gastro-esophageal reflux disease without esophagitis: Secondary | ICD-10-CM | POA: Diagnosis not present

## 2023-01-27 ENCOUNTER — Telehealth: Payer: Medicare HMO | Admitting: Internal Medicine

## 2023-01-27 ENCOUNTER — Encounter: Payer: Self-pay | Admitting: Internal Medicine

## 2023-01-27 DIAGNOSIS — J4540 Moderate persistent asthma, uncomplicated: Secondary | ICD-10-CM | POA: Diagnosis not present

## 2023-01-27 NOTE — Progress Notes (Signed)
I connected with Rhonda Bush on 01/27/2023 by video enabled telemedicine application and verified that I am speaking with the correct person using two identifiers. Patient is at home, Physician is in office.    I discussed the limitations of evaluation and management by telemedicine. The patient expressed understanding and agreed to proceed.        Rhonda Bush    [id number]    23-Sep-1950  Primary Care Physician:Sun, Charise Carwin, MD Date of Appointment: 01/27/2023 Established Patient Visit  Chief complaint:   Chief Complaint  Patient presents with   Asthma     HPI: Rhonda Bush is a 72 y.o. woman with severe persistent asthma and rheumatoid arthritis on methorexate and plaquenil. Rheumatologist - Dr. Deanne Coffer   Interval Updates: Here for asthma follow up.  Hasn't used albuterol in 2-3 months.  No interval exacerbations.   Currently on methotrexate, folic acid and plaquenil for RA. Disease stable.   Current Regimen: advair 230-21 2 puffs BID. and albuterol prn Asthma Triggers: exertion, stress/anxious ,strong smells and perfumes, environmental allergies. Exacerbations in the last year: none for asthma.  History of hospitalization or intubation: none Allergy Testing: SPT reviewed in 2020 - wnl GERD:  yes, does not take anything for it.  Allergic Rhinitis: denies ACT:  Asthma Control Test ACT Total Score  07/23/2021  9:13 AM 20  04/20/2021 11:11 AM 18  01/18/2021 11:00 AM 20   FeNO: 14 ppb on Nov 2023  No interval hospitalizations or ED visits.   I have reviewed the patient's family social and past medical history and updated as appropriate.   Past Medical History:  Diagnosis Date   Anxiety    Arthritis    rheumatoid arthritis -mild(feet,ankles,hand)-not bothersome now   Asthma    mild- not routine use of meds or inhalers   BURSITIS, LEFT SHOULDER 12/18/2006   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/2009   has improved   DEPRESSION 09/25/2006    GLAUCOMA NOS 09/25/2006   History of hiatal hernia    HYPERTENSION 09/25/2006   MIGRAINE HEADACHE 10/14/2006   OSTEOPOROSIS NOS 10/16/2006   PELVIC REGION, PAIN 12/17/2009   RESTLESS LEG SYNDROME, SEVERE 10/16/2006   not bothered now   SYNDROME, PREMENSTRUAL TENSION 10/14/2006    Past Surgical History:  Procedure Laterality Date   ABDOMINAL HYSTERECTOMY  1992 ,  [place] in [place]   [place]-fib[place]    BREAST BIOPSY Right    ESOPHAGOGASTRODUODENOSCOPY (EGD) WITH PROPOFOL N/A 02/13/2013   Procedure: ESOPHAGOGASTRODUODENOSCOPY (EGD) WITH PROPOFOL;  Surgeon: Willis Modena, MD;  Location: [facility];  Service: Endoscopy;  Laterality: N/A;   EUS N/A 02/13/2013   Procedure: ESOPHAGEAL ENDOSCOPIC ULTRASOUND (EUS) RADIAL;  Surgeon: Willis Modena, MD;  Location: [facility];  Service: Endoscopy;  Laterality: N/A;   PARATHYROIDECTOMY N/A 10/18/2018   Procedure: PARATHYROIDECTOMY;  Surgeon: Darnell Level, MD;  Location: [facility];  Service: General;  Laterality: N/A;   PARATHYROIDECTOMY N/A 10/23/2018   Procedure: NECK EXPLORATION, EVACUATION OF HEMATOMA;  Surgeon: Darnell Level, MD;  Location: [facility];  Service: General;  Laterality: N/A;    Family History  Problem Relation Age of Onset   Heart disease Mother    Diabetes type II Mother    Kidney failure Mother    Heart attack Father    High blood pressure Father    Healthy Sister    Healthy Sister    Colon cancer Brother    Arthritis Brother    Vision loss Brother  High blood pressure Brother    Healthy Son    Asthma Neg Hx     Social History   Occupational History   Occupation: retired Scientist, research (physical sciences)  Tobacco Use   Smoking status: Never   Smokeless tobacco: Never  Vaping Use   Vaping status: Never Used  Substance and Sexual Activity   Alcohol use: Yes    Comment: very rare, once in a 'blue moon'   Drug use: No   Sexual activity: Not Currently     Physical Exam: There were no vitals taken for this visit.  Gen:       No acute distress ENT:  no thrush Lungs:    breathing non labored, no wheeze  Data Reviewed: Imaging:  PFTs:  PFTs obtained June 20 2022 - normal spirometry, no bronchodilator response. Normal lung volumes and diffusion capacity.   Labs:  Immunization status: Immunization History  Administered Date(s) Administered   PFIZER(Purple Top)SARS-COV-2 Vaccination 04/05/2019, 04/26/2019, 12/13/2019, 07/31/2020, 12/23/2020   Td 03/14/1998, 11/12/2008    External Records Personally Reviewed:   Assessment:  Moderate Persistent Asthma, controlled  Plan/Recommendations: For now continue advair, prn albuterol You mentioned your insurance will no longer cover advair HFA start next year  Some acceptable alternative medications are: Lavada Mesi, Symbicort, airduo. Please let me know which one they prefer and send me a message and I will send this to your pharmacy.   I recommend, flu, rsv and covid vaccines for you this fall.   Return to Care: Return in about 1 year (around 01/27/2024).   Durel Salts, MD Pulmonary and Critical Care Medicine [facility] Office:[phone]

## 2023-01-27 NOTE — Patient Instructions (Addendum)
It was a pleasure to see you today!  Please schedule follow up scheduled with myself in 1 year.  If my schedule is not open yet, we will contact you with a reminder closer to that time. Please call [phone] if you haven't heard from [place] a month before, and always call us sooner if issues or concerns arise. You can also send [place] a message through MyChart, but but aware that this is not to be used for urgent issues and it may take up to 5-7 days to receive a reply. Please be aware that you will likely be able to view your results before I have a chance to respond to them. Please give [place] 5 business days to respond to any non-urgent results.   Glad the asthma is well controlled  For now continue advair, prn albuterol You mentioned your insurance will no longer cover advair HFA start next year  Some acceptable alternative medications are: Lavada Mesi, Symbicort, airduo. Please let me know which one they prefer and send me a message and I will send this to your pharmacy.  I recommend, flu, rsv and covid vaccines for you this fall.

## 2023-01-31 ENCOUNTER — Encounter: Payer: Self-pay | Admitting: Internal Medicine

## 2023-02-02 ENCOUNTER — Ambulatory Visit: Payer: Medicare HMO | Admitting: Professional Counselor

## 2023-02-20 ENCOUNTER — Encounter: Payer: Self-pay | Admitting: Internal Medicine

## 2023-02-22 NOTE — Telephone Encounter (Signed)
Please advise 

## 2023-02-24 MED ORDER — BUDESONIDE-FORMOTEROL FUMARATE 160-4.5 MCG/ACT IN AERO: 2.0000 | INHALATION_SPRAY | Freq: Two times a day (BID) | RESPIRATORY_TRACT | 6 refills | Status: DC

## 2023-02-28 ENCOUNTER — Encounter: Payer: Self-pay | Admitting: Physician Assistant

## 2023-02-28 ENCOUNTER — Ambulatory Visit (INDEPENDENT_AMBULATORY_CARE_PROVIDER_SITE_OTHER): Payer: Medicare HMO | Admitting: Physician Assistant

## 2023-02-28 DIAGNOSIS — F411 Generalized anxiety disorder: Secondary | ICD-10-CM

## 2023-02-28 DIAGNOSIS — F3342 Major depressive disorder, recurrent, in full remission: Secondary | ICD-10-CM | POA: Diagnosis not present

## 2023-02-28 DIAGNOSIS — F5105 Insomnia due to other mental disorder: Secondary | ICD-10-CM | POA: Diagnosis not present

## 2023-02-28 DIAGNOSIS — F99 Mental disorder, not otherwise specified: Secondary | ICD-10-CM | POA: Diagnosis not present

## 2023-02-28 MED ORDER — DULOXETINE HCL 30 MG PO CPEP: 90.0000 mg | ORAL_CAPSULE | Freq: Every day | ORAL | 1 refills | Status: DC

## 2023-02-28 NOTE — Progress Notes (Signed)
[facility]  Patient ID: Rhonda Bush,  MRN: [id number]  PCP: Deatra James, MD  Date of Evaluation: 02/28/2023 Time spent:20 minutes  Chief Complaint:  Chief Complaint   Anxiety; Depression; Follow-up    HISTORY/CURRENT STATUS: HPI For 66-month med check  Rhonda Bush is doing well.  Feels like her medications are still working just fine.  She rarely takes the Xanax.  Feels that the Cymbalta is still working.  She has been on it almost 6 months now.  Patient is able to enjoy things.  Energy and motivation are good.  No extreme sadness, tearfulness, or feelings of hopelessness.  Sleeps well most of the time. ADLs and personal hygiene are normal.   Denies any changes in concentration, making decisions, or remembering things.  Appetite has not changed.  Weight is stable.  Denies suicidal or homicidal thoughts.   Patient denies increased energy with decreased need for sleep, increased talkativeness, racing thoughts, impulsivity or risky behaviors, increased spending, increased libido, grandiosity, increased irritability or anger, paranoia, or hallucinations.  Denies dizziness, syncope, seizures, numbness, tingling, tremor, tics, unsteady gait, slurred speech, confusion. Denies muscle or joint pain, stiffness, or dystonia.  Individual Medical History/ Review of Systems: Changes? :No   Past medications for mental health diagnoses include: Prozac, Xanax for 1 month back in the 80s.   Allergies: Egg-derived products, Influenza vaccines, and Other  Current Medications:  Current Outpatient Medications:    albuterol (VENTOLIN HFA) 108 (90 Base) MCG/ACT inhaler, Inhale 2 puffs into the lungs every 4 (four) hours as needed for wheezing or shortness of breath (coughing fits)., Disp: 18 g, Rfl: 3   alendronate (FOSAMAX) 70 MG tablet, Take 70 mg by mouth once a week., Disp: , Rfl:    ALPRAZolam (XANAX) 0.25 MG tablet, Take 1-2 tablets (0.25-0.5 mg total) by mouth 2 (two) times daily as  needed for anxiety., Disp: 60 tablet, Rfl: 0   Alum Hydroxide-Mag Trisilicate (GAVISCON) 80-14.2 MG CHEW, 2 tablets after meals and at bedtime as needed, Disp: , Rfl:    amLODipine (NORVASC) 5 MG tablet, Take 5 mg by mouth daily after breakfast. , Disp: , Rfl:    budesonide-formoterol (SYMBICORT) 160-4.5 MCG/ACT inhaler, Inhale 2 puffs into the lungs 2 (two) times daily., Disp: 1 each, Rfl: 6   cetirizine (ZYRTEC) 10 MG tablet, Take 1 tablet (10 mg total) by mouth daily., Disp: 30 tablet, Rfl: 5   cholecalciferol (VITAMIN D3) 25 MCG (1000 UT) tablet, Take 1,000 Units by mouth daily after breakfast. , Disp: , Rfl:    cyanocobalamin 1000 MCG tablet, Take by mouth., Disp: , Rfl:    dorzolamide (TRUSOPT) 2 % ophthalmic solution, Place 1 drop into both eyes 2 (two) times daily., Disp: , Rfl:    furosemide (LASIX) 20 MG tablet, Take 20 mg by mouth daily. As needed, Disp: , Rfl:    hydrochlorothiazide (HYDRODIURIL) 25 MG tablet, , Disp: , Rfl:    hydroxychloroquine (PLAQUENIL) 200 MG tablet, See admin instructions., Disp: , Rfl:    latanoprost (XALATAN) 0.005 % ophthalmic solution, Place 1 drop into both eyes at bedtime., Disp: , Rfl:    metoprolol succinate (TOPROL-XL) 100 MG 24 hr tablet, Take 100 mg by mouth daily after breakfast. Take with or immediately following a meal., Disp: , Rfl:    Oyster Shell Calcium 500 MG TABS, 1 tablet with meals, Disp: , Rfl:    telmisartan (MICARDIS) 80 MG tablet, Take 80 mg by mouth daily after breakfast., Disp: , Rfl:  Vitamin D-Vitamin K (VITAMIN K2-VITAMIN D3) 45-2000 MCG-UNIT CAPS, 1 tablet, Disp: , Rfl:    DULoxetine (CYMBALTA) 30 MG capsule, Take 3 capsules (90 mg total) by mouth daily., Disp: 270 capsule, Rfl: 1   folic acid (FOLVITE) 1 MG tablet, Take 1 mg by mouth daily after breakfast.  (Patient not taking: Reported on 09/08/2022), Disp: , Rfl:    ibandronate (BONIVA) 150 MG tablet, Take 150 mg by mouth every 30 (thirty) days. (Patient not taking: Reported on  02/28/2023), Disp: , Rfl:    methotrexate (RHEUMATREX) 2.5 MG tablet, Take 12.5 mg by mouth every Monday. (Patient not taking: Reported on 09/08/2022), Disp: , Rfl:  Medication Side Effects: none  Family Medical/ Social History: Changes? No  MENTAL HEALTH EXAM:  There were no vitals taken for this visit.There is no height or weight on file to calculate BMI.  General Appearance: Casual and Well Groomed  Eye Contact:  Good  Speech:  Clear and Coherent and Normal Rate  Volume:  Normal  Mood:  Euthymic  Affect:  Congruent  Thought Process:  Goal Directed and Descriptions of Associations: Circumstantial  Orientation:  Full (Time, Place, and Person)  Thought Content: Logical   Suicidal Thoughts:  No  Homicidal Thoughts:  No  Memory:  WNL  Judgement:  Good  Insight:  Good  Psychomotor Activity:  Normal  Concentration:  Concentration: Good  Recall:  Good  Fund of Knowledge: Good  Language: Good  Assets:  Communication Skills Desire for Improvement Financial Resources/Insurance Housing Resilience Social Support Transportation  ADL's:  Intact  Cognition: WNL  Prognosis:  Good   DIAGNOSES:    ICD-10-CM   1. Recurrent major depression in full remission (HCC)  F33.42     2. Generalized anxiety disorder  F41.1     3. Insomnia due to other mental disorder  F51.05    F99       Receiving Psychotherapy: Yes   with Lieutenant Diego, [facility]  RECOMMENDATIONS:  PDMP reviewed.  Xanax filled 09/08/2022. I provided 20 minutes of face to face time during this encounter, including time spent before and after the visit in records review, medical decision making, counseling pertinent to today's visit, and charting.   She is doing very well so no changes will be made. She heard something about Cymbalta possibly causing cancer.  She asked about that.  I searched on UpToDate and could not find anything.  I reassured her that there is no data that I can find showing there is increased risk of  cancer related to Cymbalta.   Continue Xanax 0.25 mg, 1-2 p.o. twice daily as needed. Continue Cymbalta 90 mg daily. Continue therapy with Adalberto Ill, [facility]. Return in 6 months.  Melony Overly, PA-C

## 2023-03-02 ENCOUNTER — Encounter: Payer: Self-pay | Admitting: Professional Counselor

## 2023-03-02 ENCOUNTER — Ambulatory Visit: Payer: Medicare HMO | Admitting: Professional Counselor

## 2023-03-02 DIAGNOSIS — F3342 Major depressive disorder, recurrent, in full remission: Secondary | ICD-10-CM | POA: Diagnosis not present

## 2023-03-02 NOTE — Progress Notes (Unsigned)
      [facility] Counselor/Therapist Progress Note  Patient ID: Rhonda Bush, MRN: [id number],    Date: 03/02/2023  Time Spent: ***   Treatment Type: Individual Therapy  Reported Symptoms: ***  Mental Status Exam:  Appearance:   Neat     Behavior:  Appropriate, Sharing, and Motivated  Motor:  Normal  Speech/Language:   Clear and Coherent and Normal Rate  Affect:  Appropriate and Congruent  Mood:  normal  Thought process:  normal  Thought content:    WNL  Sensory/Perceptual disturbances:    WNL  Orientation:  Sound  Attention:  Good  Concentration:  Good  Memory:  WNL  Fund of knowledge:   Good  Insight:    Good  Judgment:   Good  Impulse Control:  Good   Risk Assessment: Danger to Self:  No Self-injurious Behavior: No Danger to Others: No Duty to Warn:no Physical Aggression / Violence:No  Access to Firearms a concern: No  Gang Involvement:No   Subjective: ***   Interventions: Solution-Oriented/Positive Psychology, Humanistic/Existential, and Insight-Oriented  Diagnosis:   ICD-10-CM   1. Recurrent major depression in full remission [facility])  F33.42       Plan: ***  Gaspar Bidding, [facility]

## 2023-03-27 DIAGNOSIS — M542 Cervicalgia: Secondary | ICD-10-CM | POA: Diagnosis not present

## 2023-03-27 DIAGNOSIS — R202 Paresthesia of skin: Secondary | ICD-10-CM | POA: Diagnosis not present

## 2023-03-27 DIAGNOSIS — M0579 Rheumatoid arthritis with rheumatoid factor of multiple sites without organ or systems involvement: Secondary | ICD-10-CM | POA: Diagnosis not present

## 2023-03-29 DIAGNOSIS — M2042 Other hammer toe(s) (acquired), left foot: Secondary | ICD-10-CM | POA: Diagnosis not present

## 2023-03-29 DIAGNOSIS — M7752 Other enthesopathy of left foot: Secondary | ICD-10-CM | POA: Diagnosis not present

## 2023-03-29 DIAGNOSIS — M79672 Pain in left foot: Secondary | ICD-10-CM | POA: Diagnosis not present

## 2023-03-29 DIAGNOSIS — M25572 Pain in left ankle and joints of left foot: Secondary | ICD-10-CM | POA: Diagnosis not present

## 2023-05-01 ENCOUNTER — Encounter: Payer: Self-pay | Admitting: Internal Medicine

## 2023-05-05 NOTE — Telephone Encounter (Signed)
 Please advise

## 2023-05-08 DIAGNOSIS — H25813 Combined forms of age-related cataract, bilateral: Secondary | ICD-10-CM | POA: Diagnosis not present

## 2023-05-08 DIAGNOSIS — H35033 Hypertensive retinopathy, bilateral: Secondary | ICD-10-CM | POA: Diagnosis not present

## 2023-05-08 DIAGNOSIS — Z79899 Other long term (current) drug therapy: Secondary | ICD-10-CM | POA: Diagnosis not present

## 2023-05-08 DIAGNOSIS — H402231 Chronic angle-closure glaucoma, bilateral, mild stage: Secondary | ICD-10-CM | POA: Diagnosis not present

## 2023-05-10 DIAGNOSIS — H33323 Round hole, bilateral: Secondary | ICD-10-CM | POA: Diagnosis not present

## 2023-05-10 DIAGNOSIS — H04123 Dry eye syndrome of bilateral lacrimal glands: Secondary | ICD-10-CM | POA: Diagnosis not present

## 2023-05-10 DIAGNOSIS — H2513 Age-related nuclear cataract, bilateral: Secondary | ICD-10-CM | POA: Diagnosis not present

## 2023-05-10 DIAGNOSIS — H35413 Lattice degeneration of retina, bilateral: Secondary | ICD-10-CM | POA: Diagnosis not present

## 2023-05-10 DIAGNOSIS — H43813 Vitreous degeneration, bilateral: Secondary | ICD-10-CM | POA: Diagnosis not present

## 2023-05-15 MED ORDER — FLUTICASONE-SALMETEROL 250-50 MCG/ACT IN AEPB: 1.0000 | INHALATION_SPRAY | Freq: Two times a day (BID) | RESPIRATORY_TRACT | 11 refills | Status: AC

## 2023-05-15 NOTE — Addendum Note (Signed)
 Addended by: Durel Salts on: 05/15/2023 09:00 AM   Modules accepted: Orders

## 2023-05-18 DIAGNOSIS — H33322 Round hole, left eye: Secondary | ICD-10-CM | POA: Diagnosis not present

## 2023-05-19 DIAGNOSIS — M0579 Rheumatoid arthritis with rheumatoid factor of multiple sites without organ or systems involvement: Secondary | ICD-10-CM | POA: Diagnosis not present

## 2023-05-19 DIAGNOSIS — Z79899 Other long term (current) drug therapy: Secondary | ICD-10-CM | POA: Diagnosis not present

## 2023-05-19 DIAGNOSIS — D72819 Decreased white blood cell count, unspecified: Secondary | ICD-10-CM | POA: Diagnosis not present

## 2023-05-19 DIAGNOSIS — M810 Age-related osteoporosis without current pathological fracture: Secondary | ICD-10-CM | POA: Diagnosis not present

## 2023-05-25 DIAGNOSIS — H33321 Round hole, right eye: Secondary | ICD-10-CM | POA: Diagnosis not present

## 2023-06-07 DIAGNOSIS — J4540 Moderate persistent asthma, uncomplicated: Secondary | ICD-10-CM | POA: Diagnosis not present

## 2023-06-07 DIAGNOSIS — J3089 Other allergic rhinitis: Secondary | ICD-10-CM | POA: Diagnosis not present

## 2023-06-07 DIAGNOSIS — H1045 Other chronic allergic conjunctivitis: Secondary | ICD-10-CM | POA: Diagnosis not present

## 2023-06-27 DIAGNOSIS — M0579 Rheumatoid arthritis with rheumatoid factor of multiple sites without organ or systems involvement: Secondary | ICD-10-CM | POA: Diagnosis not present

## 2023-06-27 DIAGNOSIS — R600 Localized edema: Secondary | ICD-10-CM | POA: Diagnosis not present

## 2023-06-27 DIAGNOSIS — I10 Essential (primary) hypertension: Secondary | ICD-10-CM | POA: Diagnosis not present

## 2023-06-27 DIAGNOSIS — M810 Age-related osteoporosis without current pathological fracture: Secondary | ICD-10-CM | POA: Diagnosis not present

## 2023-06-27 DIAGNOSIS — J4520 Mild intermittent asthma, uncomplicated: Secondary | ICD-10-CM | POA: Diagnosis not present

## 2023-06-27 DIAGNOSIS — R7301 Impaired fasting glucose: Secondary | ICD-10-CM | POA: Diagnosis not present

## 2023-06-27 DIAGNOSIS — F330 Major depressive disorder, recurrent, mild: Secondary | ICD-10-CM | POA: Diagnosis not present

## 2023-06-27 DIAGNOSIS — Z0000 Encounter for general adult medical examination without abnormal findings: Secondary | ICD-10-CM | POA: Diagnosis not present

## 2023-06-27 DIAGNOSIS — D84821 Immunodeficiency due to drugs: Secondary | ICD-10-CM | POA: Diagnosis not present

## 2023-07-03 ENCOUNTER — Other Ambulatory Visit: Payer: Self-pay | Admitting: Physician Assistant

## 2023-07-06 DIAGNOSIS — D72819 Decreased white blood cell count, unspecified: Secondary | ICD-10-CM | POA: Diagnosis not present

## 2023-07-06 DIAGNOSIS — M810 Age-related osteoporosis without current pathological fracture: Secondary | ICD-10-CM | POA: Diagnosis not present

## 2023-07-06 DIAGNOSIS — M79644 Pain in right finger(s): Secondary | ICD-10-CM | POA: Diagnosis not present

## 2023-07-06 DIAGNOSIS — M0579 Rheumatoid arthritis with rheumatoid factor of multiple sites without organ or systems involvement: Secondary | ICD-10-CM | POA: Diagnosis not present

## 2023-07-06 DIAGNOSIS — M6530 Trigger finger, unspecified finger: Secondary | ICD-10-CM | POA: Diagnosis not present

## 2023-07-06 DIAGNOSIS — Z79899 Other long term (current) drug therapy: Secondary | ICD-10-CM | POA: Diagnosis not present

## 2023-07-26 DIAGNOSIS — E559 Vitamin D deficiency, unspecified: Secondary | ICD-10-CM | POA: Diagnosis not present

## 2023-07-26 DIAGNOSIS — M0579 Rheumatoid arthritis with rheumatoid factor of multiple sites without organ or systems involvement: Secondary | ICD-10-CM | POA: Diagnosis not present

## 2023-07-26 DIAGNOSIS — E210 Primary hyperparathyroidism: Secondary | ICD-10-CM | POA: Diagnosis not present

## 2023-08-01 DIAGNOSIS — E210 Primary hyperparathyroidism: Secondary | ICD-10-CM | POA: Diagnosis not present

## 2023-08-01 DIAGNOSIS — Z79899 Other long term (current) drug therapy: Secondary | ICD-10-CM | POA: Diagnosis not present

## 2023-08-01 DIAGNOSIS — I10 Essential (primary) hypertension: Secondary | ICD-10-CM | POA: Diagnosis not present

## 2023-08-01 DIAGNOSIS — K219 Gastro-esophageal reflux disease without esophagitis: Secondary | ICD-10-CM | POA: Diagnosis not present

## 2023-08-01 DIAGNOSIS — M0579 Rheumatoid arthritis with rheumatoid factor of multiple sites without organ or systems involvement: Secondary | ICD-10-CM | POA: Diagnosis not present

## 2023-08-01 DIAGNOSIS — M810 Age-related osteoporosis without current pathological fracture: Secondary | ICD-10-CM | POA: Diagnosis not present

## 2023-08-01 DIAGNOSIS — E559 Vitamin D deficiency, unspecified: Secondary | ICD-10-CM | POA: Diagnosis not present

## 2023-08-10 ENCOUNTER — Other Ambulatory Visit: Payer: Self-pay | Admitting: Family Medicine

## 2023-08-10 DIAGNOSIS — M542 Cervicalgia: Secondary | ICD-10-CM

## 2023-08-10 DIAGNOSIS — M5412 Radiculopathy, cervical region: Secondary | ICD-10-CM

## 2023-08-11 DIAGNOSIS — I10 Essential (primary) hypertension: Secondary | ICD-10-CM | POA: Diagnosis not present

## 2023-08-12 DIAGNOSIS — I10 Essential (primary) hypertension: Secondary | ICD-10-CM | POA: Diagnosis not present

## 2023-08-12 DIAGNOSIS — M0579 Rheumatoid arthritis with rheumatoid factor of multiple sites without organ or systems involvement: Secondary | ICD-10-CM | POA: Diagnosis not present

## 2023-08-12 DIAGNOSIS — F3342 Major depressive disorder, recurrent, in full remission: Secondary | ICD-10-CM | POA: Diagnosis not present

## 2023-08-12 DIAGNOSIS — M810 Age-related osteoporosis without current pathological fracture: Secondary | ICD-10-CM | POA: Diagnosis not present

## 2023-08-12 DIAGNOSIS — J4520 Mild intermittent asthma, uncomplicated: Secondary | ICD-10-CM | POA: Diagnosis not present

## 2023-08-18 ENCOUNTER — Ambulatory Visit
Admission: RE | Admit: 2023-08-18 | Discharge: 2023-08-18 | Disposition: A | Discharge status: Home / Self Care | Source: Ambulatory Visit | Attending: Family Medicine | Admitting: Family Medicine

## 2023-08-18 DIAGNOSIS — M47812 Spondylosis without myelopathy or radiculopathy, cervical region: Secondary | ICD-10-CM | POA: Diagnosis not present

## 2023-08-18 DIAGNOSIS — M5412 Radiculopathy, cervical region: Secondary | ICD-10-CM

## 2023-08-18 DIAGNOSIS — M542 Cervicalgia: Secondary | ICD-10-CM

## 2023-08-29 ENCOUNTER — Encounter: Payer: Self-pay | Admitting: Physician Assistant

## 2023-08-29 ENCOUNTER — Ambulatory Visit (INDEPENDENT_AMBULATORY_CARE_PROVIDER_SITE_OTHER): Payer: Medicare HMO | Admitting: Physician Assistant

## 2023-08-29 DIAGNOSIS — F411 Generalized anxiety disorder: Secondary | ICD-10-CM

## 2023-08-29 DIAGNOSIS — R635 Abnormal weight gain: Secondary | ICD-10-CM | POA: Diagnosis not present

## 2023-08-29 DIAGNOSIS — F3342 Major depressive disorder, recurrent, in full remission: Secondary | ICD-10-CM

## 2023-08-29 MED ORDER — DULOXETINE HCL 30 MG PO CPEP: 90.0000 mg | ORAL_CAPSULE | Freq: Every day | ORAL | 1 refills | Status: DC

## 2023-08-29 MED ORDER — BUPROPION HCL ER (XL) 150 MG PO TB24: 150.0000 mg | ORAL_TABLET | Freq: Every day | ORAL | 1 refills | Status: DC

## 2023-08-29 NOTE — Patient Instructions (Signed)
 Start Wellbutrin XL 150 mg, 1 pill every morning.  If you are tolerating that in 2 weeks, drop the Cymbalta  by 30 mg which would be 2 pills (60 mg) daily.

## 2023-08-29 NOTE — Progress Notes (Signed)
 [facility]  Patient ID: Rhonda Bush,  MRN: [id number]  PCP: Sun, Vyvyan, MD  Date of Evaluation: 08/29/2023 Time spent:20 minutes  Chief Complaint:  Chief Complaint   Anxiety; Depression; Follow-up    HISTORY/CURRENT STATUS: HPI For 79-month med check  Has gained a lot of weight since starting the Cymbalta . She walks regularly and eats a very healthy diet daily.  Despite trying really hard to lose weight or at least not gain more, it is still an issue.  As far as her mood goes, she is doing very well.  Not having panic attacks or even generalized anxiety very often.  She has a prescription of Xanax  that was filled June 2024 and she has only taken a few pills at most. Patient is able to enjoy things.  Energy and motivation can be low sometimes, depends on the day.   No extreme sadness, tearfulness, or feelings of hopelessness.  Sleeps well most of the time. ADLs and personal hygiene are normal.   Denies any changes in concentration, making decisions, or remembering things.  No manic symptoms, psychosis, or delirium.  Denies suicidal or homicidal thoughts.  Denies dizziness, syncope, seizures, numbness, tingling, tremor, tics, unsteady gait, slurred speech, confusion. Denies muscle or joint pain, stiffness, or dystonia.  Individual Medical History/ Review of Systems: Changes? :No   Past medications for mental health diagnoses include: Prozac , Xanax  for 1 month back in the 80s.   Allergies: Egg-derived products, Influenza vaccines, and Other  Current Medications:  Current Outpatient Medications:    albuterol  (VENTOLIN  HFA) 108 (90 Base) MCG/ACT inhaler, Inhale 2 puffs into the lungs every 4 (four) hours as needed for wheezing or shortness of breath (coughing fits)., Disp: 18 g, Rfl: 3   alendronate (FOSAMAX) 70 MG tablet, Take 70 mg by mouth once a week., Disp: , Rfl:    ALPRAZolam  (XANAX ) 0.25 MG tablet, Take 1-2 tablets (0.25-0.5 mg total) by mouth 2 (two) times  daily as needed for anxiety., Disp: 60 tablet, Rfl: 0   Alum Hydroxide-Mag Trisilicate (GAVISCON) 80-14.2 MG CHEW, 2 tablets after meals and at bedtime as needed, Disp: , Rfl:    amLODipine  (NORVASC ) 5 MG tablet, Take 5 mg by mouth daily after breakfast. , Disp: , Rfl:    buPROPion (WELLBUTRIN XL) 150 MG 24 hr tablet, Take 1 tablet (150 mg total) by mouth daily., Disp: 30 tablet, Rfl: 1   cetirizine  (ZYRTEC ) 10 MG tablet, Take 1 tablet (10 mg total) by mouth daily., Disp: 30 tablet, Rfl: 5   cholecalciferol (VITAMIN D3) 25 MCG (1000 UT) tablet, Take 1,000 Units by mouth daily after breakfast. , Disp: , Rfl:    cyanocobalamin 1000 MCG tablet, Take by mouth., Disp: , Rfl:    dorzolamide  (TRUSOPT ) 2 % ophthalmic solution, Place 1 drop into both eyes 2 (two) times daily., Disp: , Rfl:    fluticasone -salmeterol (ADVAIR ) 250-50 MCG/ACT AEPB, Inhale 1 puff into the lungs every 12 (twelve) hours., Disp: 60 each, Rfl: 11   furosemide (LASIX) 20 MG tablet, Take 20 mg by mouth daily. As needed, Disp: , Rfl:    hydrochlorothiazide (HYDRODIURIL) 25 MG tablet, , Disp: , Rfl:    hydroxychloroquine (PLAQUENIL) 200 MG tablet, See admin instructions., Disp: , Rfl:    latanoprost  (XALATAN ) 0.005 % ophthalmic solution, Place 1 drop into both eyes at bedtime., Disp: , Rfl:    metoprolol  succinate (TOPROL -XL) 100 MG 24 hr tablet, Take 100 mg by mouth daily after breakfast. Take with or immediately  following a meal., Disp: , Rfl:    Oyster Shell Calcium 500 MG TABS, 1 tablet with meals, Disp: , Rfl:    telmisartan (MICARDIS) 80 MG tablet, Take 80 mg by mouth daily after breakfast., Disp: , Rfl:    Vitamin D-Vitamin K (VITAMIN K2-VITAMIN D3) 45-2000 MCG-UNIT CAPS, 1 tablet, Disp: , Rfl:    DULoxetine  (CYMBALTA ) 30 MG capsule, Take 3 capsules (90 mg total) by mouth daily., Disp: 90 capsule, Rfl: 1   ibandronate (BONIVA) 150 MG tablet, Take 150 mg by mouth every 30 (thirty) days. (Patient not taking: Reported on  02/28/2023), Disp: , Rfl:    methotrexate  (RHEUMATREX) 2.5 MG tablet, Take 12.5 mg by mouth every Monday. (Patient not taking: Reported on 09/08/2022), Disp: , Rfl:  Medication Side Effects: none  Family Medical/ Social History: Changes? No  MENTAL HEALTH EXAM:  There were no vitals taken for this visit.There is no height or weight on file to calculate BMI.  General Appearance: Casual, Well Groomed, and Obese  Eye Contact:  Good  Speech:  Clear and Coherent and Normal Rate  Volume:  Normal  Mood:  Euthymic  Affect:  Congruent  Thought Process:  Goal Directed and Descriptions of Associations: Circumstantial  Orientation:  Full (Time, Place, and Person)  Thought Content: Logical   Suicidal Thoughts:  No  Homicidal Thoughts:  No  Memory:  WNL  Judgement:  Good  Insight:  Good  Psychomotor Activity:  Normal  Concentration:  Concentration: Good  Recall:  Good  Fund of Knowledge: Good  Language: Good  Assets:  Communication Skills Desire for Improvement Financial Resources/Insurance Housing Resilience Social Support Transportation  ADL's:  Intact  Cognition: WNL  Prognosis:  Good   DIAGNOSES:    ICD-10-CM   1. Recurrent major depression in full remission (HCC)  F33.42     2. Generalized anxiety disorder  F41.1     3. Weight gain due to medication  R63.5    T50.905A      Receiving Psychotherapy: Yes   with Dell Fennel, [facility]  RECOMMENDATIONS:  PDMP reviewed.  Xanax  filled 09/08/2022. I provided approximately 20 minutes of face to face time during this encounter, including time spent before and after the visit in records review, medical decision making, counseling pertinent to today's visit, and charting.   We discussed the weight gain and the fact that Cymbalta  and most all of the antidepressants can cause that.  Wellbutrin is the one that prevents weight gain and sometimes helps with weight loss.  Benefits, risk and side effects were discussed and she would like to  try it.  The goal will be to wean completely off Cymbalta  if she tolerates the Wellbutrin.  She can decrease the Cymbalta  to 60 mg in 2 weeks if she is tolerating the Wellbutrin. Continue healthy lifestyle choices.   Continue Xanax  0.25 mg, 1-2 p.o. twice daily as needed. Start Wellbutrin XL 150 mg, 1 p.o. every morning. Continue Cymbalta  90 mg daily.  (See above.). Continue therapy with Delmer Ferraris, [facility]. Return in 6-8 weeks.   Marvia Slocumb, PA-C

## 2023-08-31 ENCOUNTER — Encounter: Payer: Self-pay | Admitting: Professional Counselor

## 2023-08-31 ENCOUNTER — Ambulatory Visit: Payer: Medicare HMO | Admitting: Professional Counselor

## 2023-08-31 DIAGNOSIS — F411 Generalized anxiety disorder: Secondary | ICD-10-CM | POA: Diagnosis not present

## 2023-08-31 DIAGNOSIS — F3342 Major depressive disorder, recurrent, in full remission: Secondary | ICD-10-CM | POA: Diagnosis not present

## 2023-08-31 NOTE — Progress Notes (Signed)
      [facility] Counselor/Therapist Progress Note  Patient ID: Rhonda Bush, MRN: [id number],    Date: 08/31/2023  Time Spent: 9:06 AM - 10:02 AM   Treatment Type: Individual Therapy  Reported Symptoms: worries, trouble relaxing, fatigue, trouble concentrating, forgetfulness, caregiver strain, phase of life concerns  Mental Status Exam:  Appearance:   Neat     Behavior:  Appropriate, Sharing, and Motivated  Motor:  Normal  Speech/Language:   Clear and Coherent and Normal Rate  Affect:  Appropriate and Congruent  Mood:  normal  Thought process:  normal  Thought content:    WNL  Sensory/Perceptual disturbances:    WNL  Orientation:  oriented to person, place, time/date, and situation  Attention:  Good  Concentration:  Good  Memory:  WNL  Fund of knowledge:   Good  Insight:    Good  Judgment:   Good  Impulse Control:  Good   Risk Assessment: Danger to Self:  No Self-injurious Behavior: No Danger to Others: No Duty to Warn:no Physical Aggression / Violence:No  Access to Firearms a concern: No  Gang Involvement:No   Subjective: Patient presented to session to address concerns of anxiety, and depression in remission.  Patient reported progress in both respects.  Counselor facilitated PHQ-9 and patient scored a 2, and GAD-7 and patient scored a 3.  Counselor and patient discussed results.  Patient reported having change from Wellbutrin  to Cymbalta  due to weight gain from Wellbutrin , and for the change in medications to have been going well.  She processed the experience of her sister having cancer, her housemate having moved out, her plans for vacation, and experience of health concerns including her right arm having some numbness, and her experience of some forgetfulness.  She reported church going, and staying busy with volunteer work as her primary and preferred coping skills.  Counselor celebrated patient progress with her and reinforced patient strengths and proactive  coping skills.  Patient voiced desire to have a checkup with counselor in 2 to 3 months for maintenance purposes.  Interventions: Humanistic/Existential and Assessments  Diagnosis:   ICD-10-CM   1. Recurrent major depression in full remission (HCC)  F33.42     2. Generalized anxiety disorder  F41.1       Plan: Pt is scheduled for a follow-up; continue process work and developing coping skills. STG between sessions to discuss memory concerns with primary care provider and to practice cognitive games for mental exercise; continue self advocacy and positive affirmation practices, and medication maintenance.  [name], [facility]

## 2023-09-01 ENCOUNTER — Encounter: Payer: Self-pay | Admitting: Orthopaedic Surgery

## 2023-09-01 ENCOUNTER — Ambulatory Visit: Admitting: Orthopaedic Surgery

## 2023-09-01 DIAGNOSIS — M5412 Radiculopathy, cervical region: Secondary | ICD-10-CM | POA: Diagnosis not present

## 2023-09-01 MED ORDER — PREDNISONE 10 MG (21) PO TBPK
ORAL_TABLET | ORAL | 3 refills | Status: DC
Start: 2023-09-01 — End: 2023-10-12

## 2023-09-01 MED ORDER — GABAPENTIN 100 MG PO CAPS: 100.0000 mg | ORAL_CAPSULE | Freq: Three times a day (TID) | ORAL | 3 refills | Status: AC

## 2023-09-01 NOTE — Progress Notes (Signed)
 Office Visit Note   Patient: Rhonda Bush           Date of Birth: 11/05/1950           MRN: [id number] Visit Date: 09/01/2023              Requested by: Sun, Vyvyan, MD [phone] [place],  [place] [address] PCP: Arvid Latino, MD   Assessment & Plan: Visit Diagnoses:  1. Radiculopathy, cervical region     Plan: History of Present Illness Rhonda Bush Loge is a 73 year old female who presents with tingling and numbness in her right arm. She was referred by her primary care doctor for evaluation of her symptoms.  Tingling is present in her fingers with numbness extending from her neck down the right arm. No similar symptoms are noted in the left arm. She has not received any physical therapy or treatments for these symptoms.  There is no weakness, but she experiences mild pain radiating down her arm when her head is turned to the right.  Physical Exam NECK: No neck tenderness. Spurling sign positive on right. NEUROLOGICAL: Decreased sensation in right arm.  No motor weakness  Results RADIOLOGY Cervical spine MRI:  Independently reviewed and interpreted  Assessment and Plan Cervical radiculopathy Cervical radiculopathy with right arm tingling and numbness - Refer to physical therapy. - Prescribe short course of steroids. - Prescribe gabapentin. - Consider epidural steroid injections if symptoms persist.  Follow-Up Instructions: No follow-ups on file.   Orders:  Orders Placed This Encounter  Procedures   Ambulatory referral to Physical Therapy   Meds ordered this encounter  Medications   predniSONE (STERAPRED UNI-PAK 21 TAB) 10 MG (21) TBPK tablet    Sig: Take as directed    Dispense:  21 tablet    Refill:  3   gabapentin (NEURONTIN) 100 MG capsule    Sig: Take 1 capsule (100 mg total) by mouth 3 (three) times daily.    Dispense:  30 capsule    Refill:  3    Subjective: Chief Complaint  Patient presents with   Right Arm - Numbness     HPI  Review of Systems  Constitutional: Negative.   HENT: Negative.    Eyes: Negative.   Respiratory: Negative.    Cardiovascular: Negative.   Endocrine: Negative.   Musculoskeletal: Negative.   Neurological: Negative.   Hematological: Negative.   Psychiatric/Behavioral: Negative.    All other systems reviewed and are negative.    Objective: Vital Signs: There were no vitals taken for this visit.  Physical Exam Vitals and nursing note reviewed.  Constitutional:      Appearance: She is well-developed.  HENT:     Head: Atraumatic.     Nose: Nose normal.   Eyes:     Extraocular Movements: Extraocular movements intact.    Cardiovascular:     Pulses: Normal pulses.  Pulmonary:     Effort: Pulmonary effort is normal.  Abdominal:     Palpations: Abdomen is soft.   Musculoskeletal:     Cervical back: Neck supple.   Skin:    General: Skin is warm.     Capillary Refill: Capillary refill takes less than 2 seconds.   Neurological:     Mental Status: She is alert. Mental status is at baseline.   Psychiatric:        Behavior: Behavior normal.        Thought Content: Thought content normal.  Judgment: Judgment normal.     Ortho Exam  Specialty Comments:  No specialty comments available.  Imaging: No results found.   PMFS History: Patient Active Problem List   Diagnosis Date Noted   Radiculopathy, cervical region 09/01/2023   Heartburn 10/12/2020   Moderate persistent asthma without complication 07/08/2020   Hematoma of neck 10/23/2018   Neck swelling 10/22/2018   Hyperparathyroidism, primary (HCC) 10/13/2018   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/2009   BURSITIS, LEFT SHOULDER 12/18/2006   RESTLESS LEG SYNDROME, SEVERE 10/16/2006   OSTEOPOROSIS NOS 10/16/2006   MIGRAINE HEADACHE 10/14/2006   SYNDROME, PREMENSTRUAL TENSION 10/14/2006   DEPRESSION 09/25/2006   GLAUCOMA NOS 09/25/2006   HYPERTENSION 09/25/2006   Past Medical  History:  Diagnosis Date   Anxiety    Arthritis    rheumatoid arthritis -mild(feet,ankles,hand)-not bothersome now   Asthma    mild- not routine use of meds or inhalers   BURSITIS, LEFT SHOULDER 12/18/2006   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/2009   has improved   DEPRESSION 09/25/2006   GLAUCOMA NOS 09/25/2006   History of hiatal hernia    HYPERTENSION 09/25/2006   MIGRAINE HEADACHE 10/14/2006   OSTEOPOROSIS NOS 10/16/2006   PELVIC REGION, PAIN 12/17/2009   RESTLESS LEG SYNDROME, SEVERE 10/16/2006   not bothered now   SYNDROME, PREMENSTRUAL TENSION 10/14/2006    Family History  Problem Relation Age of Onset   Heart disease Mother    Diabetes type II Mother    Kidney failure Mother    Heart attack Father    High blood pressure Father    Healthy Sister    Healthy Sister    Colon cancer Brother    Arthritis Brother    Vision loss Brother    High blood pressure Brother    Healthy Son    Asthma Neg Hx     Past Surgical History:  Procedure Laterality Date   ABDOMINAL HYSTERECTOMY  1992 ,  [place] in 1994   [place]    BREAST BIOPSY Right    ESOPHAGOGASTRODUODENOSCOPY (EGD) WITH PROPOFOL  N/A 02/13/2013   Procedure: ESOPHAGOGASTRODUODENOSCOPY (EGD) WITH PROPOFOL ;  Surgeon: Evangeline Hilts, MD;  Location: [facility];  Service: Endoscopy;  Laterality: N/A;   EUS N/A 02/13/2013   Procedure: ESOPHAGEAL ENDOSCOPIC ULTRASOUND (EUS) RADIAL;  Surgeon: Evangeline Hilts, MD;  Location: [facility];  Service: Endoscopy;  Laterality: N/A;   PARATHYROIDECTOMY N/A 10/18/2018   Procedure: PARATHYROIDECTOMY;  Surgeon: Oralee Billow, MD;  Location: [facility];  Service: General;  Laterality: N/A;   PARATHYROIDECTOMY N/A 10/23/2018   Procedure: NECK EXPLORATION, EVACUATION OF HEMATOMA;  Surgeon: Oralee Billow, MD;  Location: [facility];  Service: General;  Laterality: N/A;   Social History   Occupational History   Occupation: retired - Set designer  Tobacco Use   Smoking  status: Never   Smokeless tobacco: Never  Vaping Use   Vaping status: Never Used  Substance and Sexual Activity   Alcohol use: Yes    Comment: very rare, once in a 'blue moon'   Drug use: No   Sexual activity: Not Currently

## 2023-09-05 DIAGNOSIS — H402231 Chronic angle-closure glaucoma, bilateral, mild stage: Secondary | ICD-10-CM | POA: Diagnosis not present

## 2023-09-09 DIAGNOSIS — I10 Essential (primary) hypertension: Secondary | ICD-10-CM | POA: Diagnosis not present

## 2023-09-11 DIAGNOSIS — I10 Essential (primary) hypertension: Secondary | ICD-10-CM | POA: Diagnosis not present

## 2023-09-21 ENCOUNTER — Encounter: Payer: Self-pay | Admitting: Rehabilitative and Restorative Service Providers"

## 2023-09-21 ENCOUNTER — Ambulatory Visit: Admitting: Rehabilitative and Restorative Service Providers"

## 2023-09-21 ENCOUNTER — Other Ambulatory Visit: Payer: Self-pay | Admitting: Physician Assistant

## 2023-09-21 DIAGNOSIS — H43813 Vitreous degeneration, bilateral: Secondary | ICD-10-CM | POA: Diagnosis not present

## 2023-09-21 DIAGNOSIS — M5412 Radiculopathy, cervical region: Secondary | ICD-10-CM

## 2023-09-21 DIAGNOSIS — H2513 Age-related nuclear cataract, bilateral: Secondary | ICD-10-CM | POA: Diagnosis not present

## 2023-09-21 DIAGNOSIS — M6281 Muscle weakness (generalized): Secondary | ICD-10-CM | POA: Diagnosis not present

## 2023-09-21 DIAGNOSIS — R293 Abnormal posture: Secondary | ICD-10-CM | POA: Diagnosis not present

## 2023-09-21 DIAGNOSIS — H33323 Round hole, bilateral: Secondary | ICD-10-CM | POA: Diagnosis not present

## 2023-09-21 DIAGNOSIS — H04123 Dry eye syndrome of bilateral lacrimal glands: Secondary | ICD-10-CM | POA: Diagnosis not present

## 2023-09-21 DIAGNOSIS — H35413 Lattice degeneration of retina, bilateral: Secondary | ICD-10-CM | POA: Diagnosis not present

## 2023-09-21 NOTE — Therapy (Addendum)
 OUTPATIENT PHYSICAL THERAPY CERVICAL EVALUATION  Referring diagnosis?  M54.12 (ICD-10-CM) - Radiculopathy, cervical region     Treatment diagnosis? (if different than referring diagnosis) R29.3   M54.12   M62.81 What was this (referring dx) caused by? []  Surgery []  Fall []  Ongoing issue [x]  Arthritis [x]  Other: ___Postural_________  Laterality: [x]  Rt []  Lt []  Both  Check all possible CPT codes:  *CHOOSE 10 OR LESS*    See Planned Interventions listed in the Plan section of the Evaluation.   Patient Name: Rhonda Bush MRN: [id number] DOB:[birth year], 73 y.o., female Today's Date: 09/21/2023  END OF SESSION:  PT End of Session - 09/21/23 1716     Visit Number 1    Number of Visits 16    Date for PT Re-Evaluation 11/16/23    Authorization Type [employer]    Authorization - Visit Number 10    Progress Note Due on Visit 10    PT Start Time 1430    PT Stop Time 1516    PT Time Calculation (min) 46 min    Activity Tolerance Patient tolerated treatment well;No increased pain;Patient limited by pain    Behavior During Therapy [facility] for tasks assessed/performed          Past Medical History:  Diagnosis Date   Anxiety    Arthritis    rheumatoid arthritis -mild(feet,ankles,hand)-not bothersome now   Asthma    mild- not routine use of meds or inhalers   BURSITIS, LEFT SHOULDER 12/18/2006   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/2009   has improved   DEPRESSION 09/25/2006   GLAUCOMA NOS 09/25/2006   History of hiatal hernia    HYPERTENSION 09/25/2006   MIGRAINE HEADACHE 10/14/2006   OSTEOPOROSIS NOS 10/16/2006   PELVIC REGION, PAIN 12/17/2009   RESTLESS LEG SYNDROME, SEVERE 10/16/2006   not bothered now   SYNDROME, PREMENSTRUAL TENSION 10/14/2006   Past Surgical History:  Procedure Laterality Date   ABDOMINAL HYSTERECTOMY  1992 ,  [place] total in 1994   [place] bso-fibroids    BREAST BIOPSY Right    ESOPHAGOGASTRODUODENOSCOPY (EGD) WITH PROPOFOL  N/A 02/13/2013    Procedure: ESOPHAGOGASTRODUODENOSCOPY (EGD) WITH PROPOFOL ;  Surgeon: Rhonda Cree, MD;  Location: [facility];  Service: Endoscopy;  Laterality: N/A;   EUS N/A 02/13/2013   Procedure: ESOPHAGEAL ENDOSCOPIC ULTRASOUND (EUS) RADIAL;  Surgeon: Rhonda Cree, MD;  Location: [facility];  Service: Endoscopy;  Laterality: N/A;   PARATHYROIDECTOMY N/A 10/18/2018   Procedure: PARATHYROIDECTOMY;  Surgeon: Rhonda Boas, MD;  Location: [facility];  Service: General;  Laterality: N/A;   PARATHYROIDECTOMY N/A 10/23/2018   Procedure: NECK EXPLORATION, EVACUATION OF HEMATOMA;  Surgeon: Rhonda Boas, MD;  Location: [facility];  Service: General;  Laterality: N/A;   Patient Active Problem List   Diagnosis Date Noted   Radiculopathy, cervical region 09/01/2023   Heartburn 10/12/2020   Moderate persistent asthma without complication 07/08/2020   Hematoma of neck 10/23/2018   Neck swelling 10/22/2018   Hyperparathyroidism, primary (HCC) 10/13/2018   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/2009   BURSITIS, LEFT SHOULDER 12/18/2006   RESTLESS LEG SYNDROME, SEVERE 10/16/2006   OSTEOPOROSIS NOS 10/16/2006   MIGRAINE HEADACHE 10/14/2006   SYNDROME, PREMENSTRUAL TENSION 10/14/2006   DEPRESSION 09/25/2006   GLAUCOMA NOS 09/25/2006   HYPERTENSION 09/25/2006    PCP: Rhonda Repress, MD  REFERRING PROVIDER: Kay CHRISTELLA Cummins, MD  REFERRING DIAG: M54.12 (ICD-10-CM) - Radiculopathy, cervical region  THERAPY DIAG:  Abnormal posture - Plan: PT plan of care cert/re-cert  Radiculopathy, cervical region -  Plan: PT plan of care cert/re-cert  Muscle weakness (generalized) - Plan: PT plan of care cert/re-cert  Rationale for Evaluation and Treatment: Rehabilitation  ONSET DATE: 2 months  SUBJECTIVE:                                                                                                                                                                                                         SUBJECTIVE  STATEMENT: Rhonda Bush notes right sided tingling as distal as the fingers on a daily basis.  2 months duration.  It can wake her up at night.  Hand dominance: Right  PERTINENT HISTORY:  Rheumatoid arthritis, asthma, HTN, migraines  PAIN:  Are you having pain? Yes: NPRS scale: Can be as high as 5/10 this week Pain location: Neck to right hand Pain description: Tingling upper extremity, stiff and sore neck Aggravating factors: Not noted Relieving factors: Not noted  PRECAUTIONS: Cervical  RED FLAGS: None     WEIGHT BEARING RESTRICTIONS: No  FALLS:  Has patient fallen in last 6 months? No  LIVING ENVIRONMENT: Lives with: lives alone Lives in: House/apartment Stairs: Does OK with stairs Has following equipment at home: None  OCCUPATION: Retired  PLOF: Independent  PATIENT GOALS: Be able to use the right side without baby(ing) it.  NEXT MD VISIT: NA  OBJECTIVE:  Note: Objective measures were completed at Evaluation unless otherwise noted.  DIAGNOSTIC FINDINGS:  Degenerative spondylosis at C4-5, C5-6 and C6-7. No compressive central canal stenosis. Foraminal narrowing that could possibly cause neural compression on either side at C4-5, C5-6 and C6-7.  PATIENT SURVEYS:  PSFS: THE PATIENT SPECIFIC FUNCTIONAL SCALE  Place score of 0-10 (0 = unable to perform activity and 10 = able to perform activity at the same level as before injury or problem)  Activity Date: 09/21/2023    Writing 5/10    2.   Sleeping 7/10    3.   Driving  2/89    4.      Total Score 6.33      Total Score = Sum of activity scores/number of activities  Minimally Detectable Change: 3 points (for single activity); 2 points (for average score)  [facility] (nd). The Patient Specific Functional Scale . Retrieved from [email or web address]   COGNITION: Overall cognitive status: Within functional limits for tasks  assessed  SENSATION: Tingling and numbness of the right upper extremity as distal as the hand  POSTURE: rounded shoulders, forward head, and decreased lumbar lordosis     CERVICAL ROM:   Active ROM A/PROM (deg) eval  Flexion   Extension 60  Right lateral flexion 20  Left lateral flexion 15  Right rotation 40  Left rotation 45   (Blank rows = not tested)  UPPER EXTREMITY ROM:  Active ROM Left/Right 09/21/2023   Shoulder flexion    Shoulder extension    Shoulder abduction    Shoulder adduction    Shoulder extension    Shoulder internal rotation    Shoulder external rotation    Elbow flexion    Elbow extension    Wrist flexion    Wrist extension    Wrist ulnar deviation    Wrist radial deviation    Wrist pronation    Wrist supination     (Blank rows = not tested)  UPPER EXTREMITY STRENGTH:  In pounds assessed with hand-held dynamometer Left/Right 09/21/2023   Shoulder flexion    Shoulder extension    Shoulder abduction    Shoulder adduction    Shoulder extension    Shoulder internal rotation    Shoulder external rotation    Middle trapezius    Lower trapezius    Elbow flexion    Elbow extension    Wrist flexion    Wrist extension    Wrist ulnar deviation    Wrist radial deviation    Wrist pronation    Wrist supination    Grip strength    Cervical Extension 14.8 pounds   Cervical Lateral Bending 5.3/5.9    (Blank rows = not tested)  TREATMENT DATE: 09/21/2023 Scapular retraction/shoulder blade pinches 10 x 5 seconds Cervical rotation active range of motion with shoulders back 10 x 5 seconds Cervical extension isometrics with shoulders back 10 x 5 seconds  02464: Reviewed imaging with model, examination findings, discussed benefits of cervical traction and reviewed her day 1 home exercise program                                                                                                                               PATIENT EDUCATION:  Education  details: See above Person educated: Patient Education method: Explanation, Demonstration, Tactile cues, Verbal cues, and Handouts Education comprehension: verbalized understanding, returned demonstration, verbal cues required, tactile cues required, and needs further education  HOME EXERCISE PROGRAM: Access Code: [email or web address] URL: [email or web address] Date: 09/21/2023 Prepared by: Lamar Ivory  Exercises - Standing Scapular Retraction  - 5 x daily - 7 x weekly - 1 sets - 5 reps - 5 second hold - Seated Cervical Rotation AROM  - 3-5 x daily - 7 x weekly - 1 sets - 10 reps - 5 seconds hold - Standing Isometric Cervical Extension with Manual Resistance  - 5 x daily - 7 x weekly - 1 sets - 5 reps - 5 hold  ASSESSMENT:  CLINICAL IMPRESSION: Patient is a 73 y.o. female who was seen today for physical therapy evaluation and treatment for M54.12 (ICD-10-CM) - Radiculopathy, cervical region.  Rhonda Bush has a 6-month history of right upper extremity  radicular symptoms as distal as the fingers.  Symptoms can wake her up at night and affect everything she does with her right upper extremity.  She states symptoms are constant, although they can vary in intensity and how distal they are down her arm.  She had cervical active range of motion, strength and postural impairments that would benefit from skilled physical therapy.  Her prognosis to meet the below listed goals is good with the recommended plan of care.  OBJECTIVE IMPAIRMENTS: decreased activity tolerance, decreased endurance, decreased knowledge of condition, decreased ROM, decreased strength, decreased safety awareness, increased fascial restrictions, impaired perceived functional ability, increased muscle spasms, impaired UE functional use, improper body mechanics, postural dysfunction, and pain.   ACTIVITY LIMITATIONS: carrying, lifting, sleeping, and reach over head  PARTICIPATION LIMITATIONS: meal prep, cleaning, and community  activity  PERSONAL FACTORS: Rheumatoid arthritis, asthma, HTN, migraines are also affecting patient's functional outcome.   REHAB POTENTIAL: Good  CLINICAL DECISION MAKING: Stable/uncomplicated  EVALUATION COMPLEXITY: Low   GOALS: Goals reviewed with patient? Yes  SHORT TERM GOALS: Target date: 10/19/2023  Rhonda Bush will be independent with her day 1 home exercise program Baseline: Started 09/21/2023 Goal status: INITIAL  2.  Improve cervical active range of motion for extension to 65; lateral bending to 25 and rotation to at least 50 degrees Baseline: 60; 15/20 and 45/40 respectively Goal status: INITIAL  3.  Improve cervical extension strength to at least 25 pounds Baseline: 14.8 pounds Goal status: INITIAL   LONG TERM GOALS: Target date: 11/16/2023  Improve patient's specific functional score to at least 9.33 Baseline: 6.33 Goal status: INITIAL  2.  Rhonda Bush will report cervical pain no greater than 3/10 on the numeric pain rating scale with no complaints of scapular pain and right upper extremity radicular symptoms Baseline: Right upper extremity radicular symptoms as distal as the hand as high as 5/10 Goal status: INITIAL  3.  Improve cervical active range of motion for rotation to at least 60 degrees Baseline: 45/40 degrees Goal status: INITIAL  4.  Improve cervical extension strength to at least 30 pounds and lateral bending to at least 18 pounds Baseline: 14.8 and 5.3/5.9 respectively Goal status: INITIAL  5.  Rhonda Bush will be independent with her long-term maintenance home exercise program at discharge Baseline: Started 09/21/2023 Goal status: INITIAL   PLAN:  PT FREQUENCY: 1-2x/week  PT DURATION: 8 weeks  PLANNED INTERVENTIONS: 97110-Therapeutic exercises, 97530- Therapeutic activity, 97112- Neuromuscular re-education, 97535- Self Care, 02859- Manual therapy, 97012- Traction (mechanical), 20560 (1-2 muscles), 20561 (3+ muscles)- Dry Needling, Patient/Family education,  Spinal mobilization, Cryotherapy, and Moist heat  PLAN FOR NEXT SESSION: Review day 1 home exercises.  Consider cervical traction if no progress in symptoms as distal as the hand.  Scapular strengthening activities and education for posture and body mechanics.   Myer LELON Ivory, PT, MPT 09/21/2023, 5:29 PM

## 2023-09-27 ENCOUNTER — Encounter

## 2023-09-28 NOTE — Therapy (Signed)
 OUTPATIENT PHYSICAL THERAPY CERVICAL EVALUATION   Patient Name: Marleny Faller MRN: [id number] DOB:1950/08/01, 73 y.o., female Today's Date: 09/28/2023  END OF SESSION: ****   Past Medical History:  Diagnosis Date   Anxiety    Arthritis    rheumatoid arthritis -mild(feet,ankles,hand)-not bothersome now   Asthma    mild- not routine use of meds or inhalers   BURSITIS, LEFT SHOULDER 12/18/2006   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/2009   has improved   DEPRESSION 09/25/2006   GLAUCOMA NOS 09/25/2006   History of hiatal hernia    HYPERTENSION 09/25/2006   MIGRAINE HEADACHE 10/14/2006   OSTEOPOROSIS NOS 10/16/2006   PELVIC REGION, PAIN 12/17/2009   RESTLESS LEG SYNDROME, SEVERE 10/16/2006   not bothered now   SYNDROME, PREMENSTRUAL TENSION 10/14/2006   Past Surgical History:  Procedure Laterality Date   ABDOMINAL HYSTERECTOMY  1992 ,  made total in 1994   [place] - bso-fibroids    BREAST BIOPSY Right    ESOPHAGOGASTRODUODENOSCOPY (EGD) WITH PROPOFOL  N/A 02/13/2013   Procedure: ESOPHAGOGASTRODUODENOSCOPY (EGD) WITH PROPOFOL ;  Surgeon: Elsie Cree, MD;  Location: [facility];  Service: Endoscopy;  Laterality: N/A;   EUS N/A 02/13/2013   Procedure: ESOPHAGEAL ENDOSCOPIC ULTRASOUND (EUS) RADIAL;  Surgeon: Elsie Cree, MD;  Location: [facility];  Service: Endoscopy;  Laterality: N/A;   PARATHYROIDECTOMY N/A 10/18/2018   Procedure: PARATHYROIDECTOMY;  Surgeon: Eletha Boas, MD;  Location: [facility];  Service: General;  Laterality: N/A;   PARATHYROIDECTOMY N/A 10/23/2018   Procedure: NECK EXPLORATION, EVACUATION OF HEMATOMA;  Surgeon: Eletha Boas, MD;  Location: [facility];  Service: General;  Laterality: N/A;   Patient Active Problem List   Diagnosis Date Noted   Radiculopathy, cervical region 09/01/2023   Heartburn 10/12/2020   Moderate persistent asthma without complication 07/08/2020   Hematoma of neck 10/23/2018   Neck swelling 10/22/2018   Hyperparathyroidism,  primary (HCC) 10/13/2018   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/2009   BURSITIS, LEFT SHOULDER 12/18/2006   RESTLESS LEG SYNDROME, SEVERE 10/16/2006   OSTEOPOROSIS NOS 10/16/2006   MIGRAINE HEADACHE 10/14/2006   SYNDROME, PREMENSTRUAL TENSION 10/14/2006   DEPRESSION 09/25/2006   GLAUCOMA NOS 09/25/2006   HYPERTENSION 09/25/2006    PCP: Arnett Repress, MD  REFERRING PROVIDER: Kay CHRISTELLA Cummins, MD  REFERRING DIAG: M54.12 (ICD-10-CM) - Radiculopathy, cervical region  THERAPY DIAG:  No diagnosis found.  Rationale for Evaluation and Treatment: Rehabilitation  ONSET DATE: 2 months  SUBJECTIVE:  SUBJECTIVE STATEMENT: ******[name] notes right sided tingling as distal as the fingers on a daily basis.  2 months duration.  It can wake her up at night.  Hand dominance: Right  PERTINENT HISTORY:  Rheumatoid arthritis, asthma, HTN, migraines  PAIN:  ***Are you having pain? Yes: NPRS scale: Can be as high as 5/10 this week Pain location: Neck to right hand Pain description: Tingling upper extremity, stiff and sore neck Aggravating factors: Not noted Relieving factors: Not noted  PRECAUTIONS: Cervical  RED FLAGS: None     WEIGHT BEARING RESTRICTIONS: No  FALLS:  Has patient fallen in last 6 months? No  LIVING ENVIRONMENT: Lives with: lives alone Lives in: House/apartment Stairs: Does OK with stairs Has following equipment at home: None  OCCUPATION: Retired  PLOF: Independent  PATIENT GOALS: Be able to use the right side without baby(ing) it.  NEXT MD VISIT: NA  OBJECTIVE:  Note: Objective measures were completed at Evaluation unless otherwise noted.  DIAGNOSTIC FINDINGS:  Degenerative spondylosis at C4-5, C5-6 and C6-7. No compressive central canal stenosis.  Foraminal narrowing that could possibly cause neural compression on either side at C4-5, C5-6 and C6-7.  PATIENT SURVEYS:  PSFS: THE PATIENT SPECIFIC FUNCTIONAL SCALE  Place score of 0-10 (0 = unable to perform activity and 10 = able to perform activity at the same level as before injury or problem)  Activity Date: 09/21/2023    Writing 5/10    2.   Sleeping 7/10    3.   Driving  2/89    4.      Total Score 6.33      Total Score = Sum of activity scores/number of activities  Minimally Detectable Change: 3 points (for single activity); 2 points (for average score)  [facility] (nd). The Patient Specific Functional Scale . Retrieved from [email or web address]   COGNITION: Overall cognitive status: Within functional limits for tasks assessed  SENSATION: Tingling and numbness of the right upper extremity as distal as the hand  POSTURE: rounded shoulders, forward head, and decreased lumbar lordosis     CERVICAL ROM:   Active ROM A/PROM (deg) eval  Flexion   Extension 60  Right lateral flexion 20  Left lateral flexion 15  Right rotation 40  Left rotation 45   (Blank rows = not tested)  UPPER EXTREMITY ROM:  Active ROM Left/Right 09/21/2023   Shoulder flexion    Shoulder extension    Shoulder abduction    Shoulder adduction    Shoulder extension    Shoulder internal rotation    Shoulder external rotation    Elbow flexion    Elbow extension    Wrist flexion    Wrist extension    Wrist ulnar deviation    Wrist radial deviation    Wrist pronation    Wrist supination     (Blank rows = not tested)  UPPER EXTREMITY STRENGTH:  In pounds assessed with hand-held dynamometer Left/Right 09/21/2023   Shoulder flexion    Shoulder extension    Shoulder abduction    Shoulder adduction    Shoulder extension    Shoulder internal rotation    Shoulder external rotation    Middle trapezius    Lower trapezius     Elbow flexion    Elbow extension    Wrist flexion    Wrist extension    Wrist ulnar deviation    Wrist radial deviation    Wrist pronation    Wrist supination    Grip  strength    Cervical Extension 14.8 pounds   Cervical Lateral Bending 5.3/5.9    (Blank rows = not tested)  TREATMENT DATE:  *****    09/21/2023 Scapular retraction/shoulder blade pinches 10 x 5 seconds Cervical rotation active range of motion with shoulders back 10 x 5 seconds Cervical extension isometrics with shoulders back 10 x 5 seconds  02464: Reviewed imaging with model, examination findings, discussed benefits of cervical traction and reviewed her day 1 home exercise program                                                                                                                               PATIENT EDUCATION:  Education details: See above Person educated: Patient Education method: Explanation, Demonstration, Tactile cues, Verbal cues, and Handouts Education comprehension: verbalized understanding, returned demonstration, verbal cues required, tactile cues required, and needs further education  HOME EXERCISE PROGRAM: Access Code: [email or web address] URL: [email or web address] Date: 09/21/2023 Prepared by: Lamar Ivory  Exercises - Standing Scapular Retraction  - 5 x daily - 7 x weekly - 1 sets - 5 reps - 5 second hold - Seated Cervical Rotation AROM  - 3-5 x daily - 7 x weekly - 1 sets - 10 reps - 5 seconds hold - Standing Isometric Cervical Extension with Manual Resistance  - 5 x daily - 7 x weekly - 1 sets - 5 reps - 5 hold  ASSESSMENT:  CLINICAL IMPRESSION: *****Patient is a 73 y.o. female who was seen today for physical therapy evaluation and treatment for M54.12 (ICD-10-CM) - Radiculopathy, cervical region.  [name] has a 46-month history of right upper extremity radicular symptoms as distal as the fingers.  Symptoms can wake her up at night and affect everything she does with her right  upper extremity.  She states symptoms are constant, although they can vary in intensity and how distal they are down her arm.  She had cervical active range of motion, strength and postural impairments that would benefit from skilled physical therapy.  Her prognosis to meet the below listed goals is good with the recommended plan of care.  OBJECTIVE IMPAIRMENTS: decreased activity tolerance, decreased endurance, decreased knowledge of condition, decreased ROM, decreased strength, decreased safety awareness, increased fascial restrictions, impaired perceived functional ability, increased muscle spasms, impaired UE functional use, improper body mechanics, postural dysfunction, and pain.   ACTIVITY LIMITATIONS: carrying, lifting, sleeping, and reach over head  PARTICIPATION LIMITATIONS: meal prep, cleaning, and community activity  PERSONAL FACTORS: Rheumatoid arthritis, asthma, HTN, migraines are also affecting patient's functional outcome.   REHAB POTENTIAL: Good  CLINICAL DECISION MAKING: Stable/uncomplicated  EVALUATION COMPLEXITY: Low   GOALS: Goals reviewed with patient? Yes  SHORT TERM GOALS: Target date: 10/19/2023  [name] will be independent with her day 1 home exercise program Baseline: Started 09/21/2023 Goal status: INITIAL  2.  Improve cervical active range of motion for extension to 65; lateral bending to 25 and rotation to at least 50 degrees  Baseline: 60; 15/20 and 45/40 respectively Goal status: INITIAL  3.  Improve cervical extension strength to at least 25 pounds Baseline: 14.8 pounds Goal status: INITIAL   LONG TERM GOALS: Target date: 11/16/2023  Improve patient's specific functional score to at least 9.33 Baseline: 6.33 Goal status: INITIAL  2.  [name] will report cervical pain no greater than 3/10 on the numeric pain rating scale with no complaints of scapular pain and right upper extremity radicular symptoms Baseline: Right upper extremity radicular symptoms as  distal as the hand as high as 5/10 Goal status: INITIAL  3.  Improve cervical active range of motion for rotation to at least 60 degrees Baseline: 45/40 degrees Goal status: INITIAL  4.  Improve cervical extension strength to at least 30 pounds and lateral bending to at least 18 pounds Baseline: 14.8 and 5.3/5.9 respectively Goal status: INITIAL  5.  [name] will be independent with her long-term maintenance home exercise program at discharge Baseline: Started 09/21/2023 Goal status: INITIAL   PLAN:  PT FREQUENCY: 1-2x/week  PT DURATION: 8 weeks  PLANNED INTERVENTIONS: 97110-Therapeutic exercises, 97530- Therapeutic activity, 97112- Neuromuscular re-education, 97535- Self Care, 02859- Manual therapy, 97012- Traction (mechanical), 20560 (1-2 muscles), 20561 (3+ muscles)- Dry Needling, Patient/Family education, Spinal mobilization, Cryotherapy, and Moist heat  PLAN FOR NEXT SESSION: ***Review day 1 home exercises.  Consider cervical traction if no progress in symptoms as distal as the hand.  Scapular strengthening activities and education for posture and body mechanics.   Burnard Meth, PT 09/28/23  8:06 AM

## 2023-09-29 ENCOUNTER — Ambulatory Visit

## 2023-09-29 DIAGNOSIS — R293 Abnormal posture: Secondary | ICD-10-CM | POA: Diagnosis not present

## 2023-09-29 DIAGNOSIS — M5412 Radiculopathy, cervical region: Secondary | ICD-10-CM

## 2023-09-29 DIAGNOSIS — M6281 Muscle weakness (generalized): Secondary | ICD-10-CM | POA: Diagnosis not present

## 2023-10-08 NOTE — Therapy (Signed)
 OUTPATIENT PHYSICAL THERAPY CERVICAL TREATMENT   Patient Name: Rhonda Bush MRN: [id number] DOB:12/18/1950, 73 y.o., female Today's Date: 10/09/2023  END OF SESSION:  PT End of Session - 10/09/23 0827     Visit Number 3    Number of Visits 16    Date for PT Re-Evaluation 11/16/23    Authorization Type [employer]    PT Start Time 0800    PT Stop Time 0839    PT Time Calculation (min) 39 min    Activity Tolerance Patient tolerated treatment well    Behavior During Therapy WFL for tasks assessed/performed            Past Medical History:  Diagnosis Date   Anxiety    Arthritis    rheumatoid arthritis -mild(feet,ankles,hand)-not bothersome now   Asthma    mild- not routine use of meds or inhalers   BURSITIS, LEFT SHOULDER 12/18/2006   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/2009   has improved   DEPRESSION 09/25/2006   GLAUCOMA NOS 09/25/2006   History of hiatal hernia    HYPERTENSION 09/25/2006   MIGRAINE HEADACHE 10/14/2006   OSTEOPOROSIS NOS 10/16/2006   PELVIC REGION, PAIN 12/17/2009   RESTLESS LEG SYNDROME, SEVERE 10/16/2006   not bothered now   SYNDROME, PREMENSTRUAL TENSION 10/14/2006   Past Surgical History:  Procedure Laterality Date   ABDOMINAL HYSTERECTOMY  1992 ,  [place] total in 1994   [place] bso-fibro[place]    BREAST BIOPSY Right    ESOPHAGOGASTRODUODENOSCOPY (EGD) WITH PROPOFOL  N/A 02/13/2013   Procedure: ESOPHAGOGASTRODUODENOSCOPY (EGD) WITH PROPOFOL ;  Surgeon: Elsie Cree, MD;  Location: [facility];  Service: Endoscopy;  Laterality: N/A;   EUS N/A 02/13/2013   Procedure: ESOPHAGEAL ENDOSCOPIC ULTRASOUND (EUS) RADIAL;  Surgeon: Elsie Cree, MD;  Location: [facility];  Service: Endoscopy;  Laterality: N/A;   PARATHYROIDECTOMY N/A 10/18/2018   Procedure: PARATHYROIDECTOMY;  Surgeon: Eletha Boas, MD;  Location: [facility];  Service: General;  Laterality: N/A;   PARATHYROIDECTOMY N/A 10/23/2018   Procedure: NECK EXPLORATION, EVACUATION OF  HEMATOMA;  Surgeon: Eletha Boas, MD;  Location: [facility];  Service: General;  Laterality: N/A;   Patient Active Problem List   Diagnosis Date Noted   Radiculopathy, cervical region 09/01/2023   Heartburn 10/12/2020   Moderate persistent asthma without complication 07/08/2020   Hematoma of neck 10/23/2018   Neck swelling 10/22/2018   Hyperparathyroidism, primary (HCC) 10/13/2018   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/2009   BURSITIS, LEFT SHOULDER 12/18/2006   RESTLESS LEG SYNDROME, SEVERE 10/16/2006   OSTEOPOROSIS NOS 10/16/2006   MIGRAINE HEADACHE 10/14/2006   SYNDROME, PREMENSTRUAL TENSION 10/14/2006   DEPRESSION 09/25/2006   GLAUCOMA NOS 09/25/2006   HYPERTENSION 09/25/2006    PCP: Arnett Repress, MD  REFERRING PROVIDER: Kay CHRISTELLA Cummins, MD  REFERRING DIAG: M54.12 (ICD-10-CM) - Radiculopathy, cervical region  THERAPY DIAG:  Abnormal posture  Radiculopathy, cervical region  Muscle weakness (generalized)  Rationale for Evaluation and Treatment: Rehabilitation  ONSET DATE: 2 months  SUBJECTIVE:  SUBJECTIVE STATEMENT: 10/09/23 Pt reports R shoulder feels heavy affecting reaching.   Hand dominance: Right  PERTINENT HISTORY:  Rheumatoid arthritis, asthma, HTN, migraines  PAIN:  10/09/23   Are you having pain? Yes: NPRS scale: Max 8/10  Pain location: Neck to right hand Pain description: Tingling upper extremity, stiff and sore neck Aggravating factors: Not noted Relieving factors: Not noted  PRECAUTIONS: Cervical  RED FLAGS: None     WEIGHT BEARING RESTRICTIONS: No  FALLS:  Has patient fallen in last 6 months? No  LIVING ENVIRONMENT: Lives with: lives alone Lives in: House/apartment Stairs: Does OK with stairs Has following equipment at home:  None  OCCUPATION: Retired  PLOF: Independent  PATIENT GOALS: Be able to use the right side without baby(ing) it.  NEXT MD VISIT: NA  OBJECTIVE:  Note: Objective measures were completed at Evaluation unless otherwise noted.  DIAGNOSTIC FINDINGS:  Degenerative spondylosis at C4-5, C5-6 and C6-7. No compressive central canal stenosis. Foraminal narrowing that could possibly cause neural compression on either side at C4-5, C5-6 and C6-7.  PATIENT SURVEYS:  PSFS: THE PATIENT SPECIFIC FUNCTIONAL SCALE  Place score of 0-10 (0 = unable to perform activity and 10 = able to perform activity at the same level as before injury or problem)  Activity Date: 09/21/2023    Writing 5/10    2.   Sleeping 7/10    3.   Driving  2/89    4.      Total Score 6.33      Total Score = Sum of activity scores/number of activities  Minimally Detectable Change: 3 points (for single activity); 2 points (for average score)  [facility] (nd). The Patient Specific Functional Scale . Retrieved from [email or web address]   COGNITION: Overall cognitive status: Within functional limits for tasks assessed  SENSATION: Tingling and numbness of the right upper extremity as distal as the hand  POSTURE: rounded shoulders, forward head, and decreased lumbar lordosis     CERVICAL ROM:   Active ROM A/PROM (deg) eval  Flexion   Extension 60  Right lateral flexion 20  Left lateral flexion 15  Right rotation 40  Left rotation 45   (Blank rows = not tested)  UPPER EXTREMITY ROM:  Active ROM Left/Right 09/21/2023   Shoulder flexion    Shoulder extension    Shoulder abduction    Shoulder adduction    Shoulder extension    Shoulder internal rotation    Shoulder external rotation    Elbow flexion    Elbow extension    Wrist flexion    Wrist extension    Wrist ulnar deviation    Wrist radial deviation    Wrist pronation    Wrist  supination     (Blank rows = not tested)  UPPER EXTREMITY STRENGTH:  In pounds assessed with hand-held dynamometer Left/Right 09/21/2023   Shoulder flexion    Shoulder extension    Shoulder abduction    Shoulder adduction    Shoulder extension    Shoulder internal rotation    Shoulder external rotation    Middle trapezius    Lower trapezius    Elbow flexion    Elbow extension    Wrist flexion    Wrist extension    Wrist ulnar deviation    Wrist radial deviation    Wrist pronation    Wrist supination    Grip strength    Cervical Extension 14.8 pounds   Cervical Lateral Bending 5.3/5.9    (Blank  rows = not tested)  TREATMENT DATE:  10/09/23 Seated:  UBE backwards for posture 5 min Scapular retraction/shoulder blade pinches 10 x 5 seconds Cervical rotation active range of motion with shoulders back 10 x 5 seconds Cervical extension isometrics with shoulders back 10 x 5 seconds Cervical retractions 2x10 Standing: Shoulder flexion towel slides on wall Bilateral ER with T Band Green 2x10 Shoulder ext with T band green 3x10 Shoulder rows with Tband green 3x10 Supine: Cervical rotation on ball or pillow Cervical retraction supine on pillow 20x Manual: Cervical traction    09/29/23 Seated:  Scapular retraction/shoulder blade pinches 10 x 5 seconds Cervical rotation active range of motion with shoulders back 10 x 5 seconds Cervical extension isometrics with shoulders back 10 x 5 seconds Cervical retractions 2x10 Standing: Bilateral ER with T Band Red 2x10 Shoulder ext with T band green 3x10 Supine: Cervical AROM head on ball Cervical rotation supine on ball 20x UBE 4 min for posture Manual: Cervical traction  09/21/2023 Scapular retraction/shoulder blade pinches 10 x 5 seconds Cervical rotation active range of motion with shoulders back 10 x 5 seconds Cervical extension isometrics with shoulders back 10 x 5 seconds  02464: Reviewed imaging with model,  examination findings, discussed benefits of cervical traction and reviewed her day 1 home exercise program                                                                                                                               PATIENT EDUCATION:  Education details: See above Person educated: Patient Education method: Explanation, Demonstration, Tactile cues, Verbal cues, and Handouts Education comprehension: verbalized understanding, returned demonstration, verbal cues required, tactile cues required, and needs further education  HOME EXERCISE PROGRAM: Access Code: [email or web address] URL: [email or web address] Date: 09/21/2023 Prepared by: Lamar Ivory  Exercises - Standing Scapular Retraction  - 5 x daily - 7 x weekly - 1 sets - 5 reps - 5 second hold - Seated Cervical Rotation AROM  - 3-5 x daily - 7 x weekly - 1 sets - 10 reps - 5 seconds hold - Standing Isometric Cervical Extension with Manual Resistance  - 5 x daily - 7 x weekly - 1 sets - 5 reps - 5 hold  ASSESSMENT:  CLINICAL IMPRESSION: Pt needed VC for scapular retractions for posture with towel slides.   Pt demonstrated understanding.  OBJECTIVE IMPAIRMENTS: decreased activity tolerance, decreased endurance, decreased knowledge of condition, decreased ROM, decreased strength, decreased safety awareness, increased fascial restrictions, impaired perceived functional ability, increased muscle spasms, impaired UE functional use, improper body mechanics, postural dysfunction, and pain.   ACTIVITY LIMITATIONS: carrying, lifting, sleeping, and reach over head  PARTICIPATION LIMITATIONS: meal prep, cleaning, and community activity  PERSONAL FACTORS: Rheumatoid arthritis, asthma, HTN, migraines are also affecting patient's functional outcome.   REHAB POTENTIAL: Good  CLINICAL DECISION MAKING: Stable/uncomplicated  EVALUATION COMPLEXITY: Low   GOALS: Goals reviewed with patient? Yes  SHORT TERM GOALS: Target date:  10/19/2023  [name] will be independent with her day 1 home exercise program Baseline: Started 09/21/2023 Goal status: INITIAL  2.  Improve cervical active range of motion for extension to 65; lateral bending to 25 and rotation to at least 50 degrees Baseline: 60; 15/20 and 45/40 respectively Goal status: INITIAL  3.  Improve cervical extension strength to at least 25 pounds Baseline: 14.8 pounds Goal status: INITIAL   LONG TERM GOALS: Target date: 11/16/2023  Improve patient's specific functional score to at least 9.33 Baseline: 6.33 Goal status: INITIAL  2.  [name] will report cervical pain no greater than 3/10 on the numeric pain rating scale with no complaints of scapular pain and right upper extremity radicular symptoms Baseline: Right upper extremity radicular symptoms as distal as the hand as high as 5/10 Goal status: INITIAL  3.  Improve cervical active range of motion for rotation to at least 60 degrees Baseline: 45/40 degrees Goal status: INITIAL  4.  Improve cervical extension strength to at least 30 pounds and lateral bending to at least 18 pounds Baseline: 14.8 and 5.3/5.9 respectively Goal status: INITIAL  5.  [name] will be independent with her long-term maintenance home exercise program at discharge Baseline: Started 09/21/2023 Goal status: INITIAL   PLAN:  PT FREQUENCY: 1-2x/week  PT DURATION: 8 weeks  PLANNED INTERVENTIONS: 97110-Therapeutic exercises, 97530- Therapeutic activity, 97112- Neuromuscular re-education, 97535- Self Care, 02859- Manual therapy, 97012- Traction (mechanical), 20560 (1-2 muscles), 20561 (3+ muscles)- Dry Needling, Patient/Family education, Spinal mobilization, Cryotherapy, and Moist heat  PLAN FOR NEXT SESSION:   Increase postural exercises.  Burnard Meth, PT 10/09/23  8:28 AM

## 2023-10-09 ENCOUNTER — Ambulatory Visit

## 2023-10-09 DIAGNOSIS — M6281 Muscle weakness (generalized): Secondary | ICD-10-CM

## 2023-10-09 DIAGNOSIS — I10 Essential (primary) hypertension: Secondary | ICD-10-CM | POA: Diagnosis not present

## 2023-10-09 DIAGNOSIS — M5412 Radiculopathy, cervical region: Secondary | ICD-10-CM | POA: Diagnosis not present

## 2023-10-09 DIAGNOSIS — R293 Abnormal posture: Secondary | ICD-10-CM

## 2023-10-10 NOTE — Therapy (Signed)
 OUTPATIENT PHYSICAL THERAPY CERVICAL TREATMENT   Patient Name: Rhonda Bush MRN: [id number] DOB:[birth year], 73 y.o., female Today's Date: 10/11/2023  END OF SESSION:  PT End of Session - 10/11/23 1432     Visit Number 4    Date for PT Re-Evaluation 11/16/23    Authorization Type [employer]    PT Start Time 1345    PT Stop Time 1426    PT Time Calculation (min) 41 min    Activity Tolerance Patient tolerated treatment well    Behavior During Therapy WFL for tasks assessed/performed             Past Medical History:  Diagnosis Date   Anxiety    Arthritis    rheumatoid arthritis -mild(feet,ankles,hand)-not bothersome now   Asthma    mild- not routine use of meds or inhalers   BURSITIS, LEFT SHOULDER 12/18/2006   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/2009   has improved   DEPRESSION 09/25/2006   GLAUCOMA NOS 09/25/2006   History of hiatal hernia    HYPERTENSION 09/25/2006   MIGRAINE HEADACHE 10/14/2006   OSTEOPOROSIS NOS 10/16/2006   PELVIC REGION, PAIN 12/17/2009   RESTLESS LEG SYNDROME, SEVERE 10/16/2006   not bothered now   SYNDROME, PREMENSTRUAL TENSION 10/14/2006   Past Surgical History:  Procedure Laterality Date   ABDOMINAL HYSTERECTOMY  1992 ,  made total in 1994   [place] - bso-fibroids    BREAST BIOPSY Right    ESOPHAGOGASTRODUODENOSCOPY (EGD) WITH PROPOFOL  N/A 02/13/2013   Procedure: ESOPHAGOGASTRODUODENOSCOPY (EGD) WITH PROPOFOL ;  Surgeon: Elsie Cree, MD;  Location: [facility];  Service: Endoscopy;  Laterality: N/A;   EUS N/A 02/13/2013   Procedure: ESOPHAGEAL ENDOSCOPIC ULTRASOUND (EUS) RADIAL;  Surgeon: Elsie Cree, MD;  Location: [facility];  Service: Endoscopy;  Laterality: N/A;   PARATHYROIDECTOMY N/A 10/18/2018   Procedure: PARATHYROIDECTOMY;  Surgeon: Eletha Boas, MD;  Location: [facility];  Service: General;  Laterality: N/A;   PARATHYROIDECTOMY N/A 10/23/2018   Procedure: NECK EXPLORATION, EVACUATION OF HEMATOMA;  Surgeon: Eletha Boas, MD;  Location: [facility];  Service: General;  Laterality: N/A;   Patient Active Problem List   Diagnosis Date Noted   Radiculopathy, cervical region 09/01/2023   Heartburn 10/12/2020   Moderate persistent asthma without complication 07/08/2020   Hematoma of neck 10/23/2018   Neck swelling 10/22/2018   Hyperparathyroidism, primary (HCC) 10/13/2018   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/2009   BURSITIS, LEFT SHOULDER 12/18/2006   RESTLESS LEG SYNDROME, SEVERE 10/16/2006   OSTEOPOROSIS NOS 10/16/2006   MIGRAINE HEADACHE 10/14/2006   SYNDROME, PREMENSTRUAL TENSION 10/14/2006   DEPRESSION 09/25/2006   GLAUCOMA NOS 09/25/2006   HYPERTENSION 09/25/2006    PCP: Arnett Repress, MD  REFERRING PROVIDER: Kay CHRISTELLA Cummins, MD  REFERRING DIAG: M54.12 (ICD-10-CM) - Radiculopathy, cervical region  THERAPY DIAG:  Abnormal posture  Radiculopathy, cervical region  Muscle weakness (generalized)  Rationale for Evaluation and Treatment: Rehabilitation  ONSET DATE: 2 months  SUBJECTIVE:  SUBJECTIVE STATEMENT: 10/11/23 Pt states pain has reduced and arm doesn't feel as heavy with motion. Hand dominance: Right  PERTINENT HISTORY:  Rheumatoid arthritis, asthma, HTN, migraines  PAIN:  10/11/23   Are you having pain? Yes: NPRS scale: Max 5/10  Pain location: Neck to right hand Pain description: Tingling upper extremity, stiff and sore neck Aggravating factors: Not noted Relieving factors: Not noted  PRECAUTIONS: Cervical  RED FLAGS: None     WEIGHT BEARING RESTRICTIONS: No  FALLS:  Has patient fallen in last 6 months? No  LIVING ENVIRONMENT: Lives with: lives alone Lives in: House/apartment Stairs: Does OK with stairs Has following equipment at home: None  OCCUPATION:  Retired  PLOF: Independent  PATIENT GOALS: Be able to use the right side without baby(ing) it.  NEXT MD VISIT: NA  OBJECTIVE:  Note: Objective measures were completed at Evaluation unless otherwise noted.  DIAGNOSTIC FINDINGS:  Degenerative spondylosis at C4-5, C5-6 and C6-7. No compressive central canal stenosis. Foraminal narrowing that could possibly cause neural compression on either side at C4-5, C5-6 and C6-7.  PATIENT SURVEYS:  PSFS: THE PATIENT SPECIFIC FUNCTIONAL SCALE  Place score of 0-10 (0 = unable to perform activity and 10 = able to perform activity at the same level as before injury or problem)  Activity Date: 09/21/2023    Writing 5/10    2.   Sleeping 7/10    3.   Driving  2/89    4.      Total Score 6.33      Total Score = Sum of activity scores/number of activities  Minimally Detectable Change: 3 points (for single activity); 2 points (for average score)  [facility] (nd). The Patient Specific Functional Scale . Retrieved from [email or web address]   COGNITION: Overall cognitive status: Within functional limits for tasks assessed  SENSATION: Tingling and numbness of the right upper extremity as distal as the hand  POSTURE: rounded shoulders, forward head, and decreased lumbar lordosis     CERVICAL ROM:   Active ROM A/PROM (deg) eval  Flexion   Extension 60  Right lateral flexion 20  Left lateral flexion 15  Right rotation 40  Left rotation 45   (Blank rows = not tested)  UPPER EXTREMITY ROM:  Active ROM Left/Right 09/21/2023   Shoulder flexion    Shoulder extension    Shoulder abduction    Shoulder adduction    Shoulder extension    Shoulder internal rotation    Shoulder external rotation    Elbow flexion    Elbow extension    Wrist flexion    Wrist extension    Wrist ulnar deviation    Wrist radial deviation    Wrist pronation    Wrist supination     (Blank  rows = not tested)  UPPER EXTREMITY STRENGTH:  In pounds assessed with hand-held dynamometer Left/Right 09/21/2023   Shoulder flexion    Shoulder extension    Shoulder abduction    Shoulder adduction    Shoulder extension    Shoulder internal rotation    Shoulder external rotation    Middle trapezius    Lower trapezius    Elbow flexion    Elbow extension    Wrist flexion    Wrist extension    Wrist ulnar deviation    Wrist radial deviation    Wrist pronation    Wrist supination    Grip strength    Cervical Extension 14.8 pounds   Cervical Lateral Bending 5.3/5.9    (  Blank rows = not tested)  TREATMENT DATE:  10/11/23  1345-1428 Seated:  UBE backwards for posture 6 min Scapular retraction/shoulder blade pinches 10 x 5 seconds Cervical rotation active range of motion with shoulders back 10 x 5 seconds Cervical extension isometrics with shoulders back 10 x 5 seconds Cervical retractions 2x10 Standing: Shoulder flexion towel slides on wall Bilateral ER with T Band Red 3x10 Shoulder ext with T band green 3x10 Shoulder rows with Tband green 3x10 Supine: Cervical rotation on ball or pillow Cervical retraction supine on pillow 20x Manual: Cervical traction        10/09/23 Seated:  UBE backwards for posture 5 min Scapular retraction/shoulder blade pinches 10 x 5 seconds Cervical rotation active range of motion with shoulders back 10 x 5 seconds Cervical extension isometrics with shoulders back 10 x 5 seconds Cervical retractions 2x10 Standing: Shoulder flexion towel slides on wall Bilateral ER with T Band Green 2x10 Shoulder ext with T band green 3x10 Shoulder rows with Tband green 3x10 Supine: Cervical rotation on ball or pillow Cervical retraction supine on pillow 20x Manual: Cervical traction    09/29/23 Seated:  Scapular retraction/shoulder blade pinches 10 x 5 seconds Cervical rotation active range of motion with shoulders back 10 x 5  seconds Cervical extension isometrics with shoulders back 10 x 5 seconds Cervical retractions 2x10 Standing: Bilateral ER with T Band Red 2x10 Shoulder ext with T band green 3x10 Supine: Cervical AROM head on ball Cervical rotation supine on ball 20x UBE 4 min for posture Manual: Cervical traction  09/21/2023 Scapular retraction/shoulder blade pinches 10 x 5 seconds Cervical rotation active range of motion with shoulders back 10 x 5 seconds Cervical extension isometrics with shoulders back 10 x 5 seconds  02464: Reviewed imaging with model, examination findings, discussed benefits of cervical traction and reviewed her day 1 home exercise program                                                                                                                               PATIENT EDUCATION:  Education details: See above Person educated: Patient Education method: Explanation, Demonstration, Tactile cues, Verbal cues, and Handouts Education comprehension: verbalized understanding, returned demonstration, verbal cues required, tactile cues required, and needs further education  HOME EXERCISE PROGRAM: Access Code: [email or web address] URL: [email or web address] Date: 09/21/2023 Prepared by: Lamar Ivory  Exercises - Standing Scapular Retraction  - 5 x daily - 7 x weekly - 1 sets - 5 reps - 5 second hold - Seated Cervical Rotation AROM  - 3-5 x daily - 7 x weekly - 1 sets - 10 reps - 5 seconds hold - Standing Isometric Cervical Extension with Manual Resistance  - 5 x daily - 7 x weekly - 1 sets - 5 reps - 5 hold  ASSESSMENT:  CLINICAL IMPRESSION: Improved shoulder flexion without pain as demonstrated by wall slides.  Pt needed some cuing for retraction with tband exercises.  OBJECTIVE IMPAIRMENTS:  decreased activity tolerance, decreased endurance, decreased knowledge of condition, decreased ROM, decreased strength, decreased safety awareness, increased fascial restrictions,  impaired perceived functional ability, increased muscle spasms, impaired UE functional use, improper body mechanics, postural dysfunction, and pain.   ACTIVITY LIMITATIONS: carrying, lifting, sleeping, and reach over head  PARTICIPATION LIMITATIONS: meal prep, cleaning, and community activity  PERSONAL FACTORS: Rheumatoid arthritis, asthma, HTN, migraines are also affecting patient's functional outcome.   REHAB POTENTIAL: Good  CLINICAL DECISION MAKING: Stable/uncomplicated  EVALUATION COMPLEXITY: Low   GOALS: Goals reviewed with patient? Yes  SHORT TERM GOALS: Target date: 10/19/2023  [name] will be independent with her day 1 home exercise program Baseline: Started 09/21/2023 Goal status: INITIAL  2.  Improve cervical active range of motion for extension to 65; lateral bending to 25 and rotation to at least 50 degrees Baseline: 60; 15/20 and 45/40 respectively Goal status: INITIAL  3.  Improve cervical extension strength to at least 25 pounds Baseline: 14.8 pounds Goal status: INITIAL   LONG TERM GOALS: Target date: 11/16/2023  Improve patient's specific functional score to at least 9.33 Baseline: 6.33 Goal status: INITIAL  2.  [name] will report cervical pain no greater than 3/10 on the numeric pain rating scale with no complaints of scapular pain and right upper extremity radicular symptoms Baseline: Right upper extremity radicular symptoms as distal as the hand as high as 5/10 Goal status: INITIAL  3.  Improve cervical active range of motion for rotation to at least 60 degrees Baseline: 45/40 degrees Goal status: INITIAL  4.  Improve cervical extension strength to at least 30 pounds and lateral bending to at least 18 pounds Baseline: 14.8 and 5.3/5.9 respectively Goal status: INITIAL  5.  [name] will be independent with her long-term maintenance home exercise program at discharge Baseline: Started 09/21/2023 Goal status: INITIAL   PLAN:  PT FREQUENCY: 1-2x/week  PT  DURATION: 8 weeks  PLANNED INTERVENTIONS: 97110-Therapeutic exercises, 97530- Therapeutic activity, 97112- Neuromuscular re-education, 97535- Self Care, 02859- Manual therapy, 97012- Traction (mechanical), [phone] (1-2 muscles), 20561 (3+ muscles)- Dry Needling, Patient/Family education, Spinal mobilization, Cryotherapy, and Moist heat  PLAN FOR NEXT SESSION:   Increase postural exercises and bicep/ tricep work.  Burnard Meth, PT 10/11/23  2:34 PM

## 2023-10-11 ENCOUNTER — Ambulatory Visit (INDEPENDENT_AMBULATORY_CARE_PROVIDER_SITE_OTHER)

## 2023-10-11 DIAGNOSIS — M5412 Radiculopathy, cervical region: Secondary | ICD-10-CM

## 2023-10-11 DIAGNOSIS — R293 Abnormal posture: Secondary | ICD-10-CM

## 2023-10-11 DIAGNOSIS — M6281 Muscle weakness (generalized): Secondary | ICD-10-CM

## 2023-10-12 ENCOUNTER — Encounter: Payer: Self-pay | Admitting: Physician Assistant

## 2023-10-12 ENCOUNTER — Ambulatory Visit (INDEPENDENT_AMBULATORY_CARE_PROVIDER_SITE_OTHER): Admitting: Physician Assistant

## 2023-10-12 DIAGNOSIS — J4520 Mild intermittent asthma, uncomplicated: Secondary | ICD-10-CM | POA: Diagnosis not present

## 2023-10-12 DIAGNOSIS — F411 Generalized anxiety disorder: Secondary | ICD-10-CM | POA: Diagnosis not present

## 2023-10-12 DIAGNOSIS — R635 Abnormal weight gain: Secondary | ICD-10-CM | POA: Diagnosis not present

## 2023-10-12 DIAGNOSIS — F3342 Major depressive disorder, recurrent, in full remission: Secondary | ICD-10-CM | POA: Diagnosis not present

## 2023-10-12 DIAGNOSIS — T50905A Adverse effect of unspecified drugs, medicaments and biological substances, initial encounter: Secondary | ICD-10-CM

## 2023-10-12 DIAGNOSIS — M0579 Rheumatoid arthritis with rheumatoid factor of multiple sites without organ or systems involvement: Secondary | ICD-10-CM | POA: Diagnosis not present

## 2023-10-12 DIAGNOSIS — M810 Age-related osteoporosis without current pathological fracture: Secondary | ICD-10-CM | POA: Diagnosis not present

## 2023-10-12 DIAGNOSIS — I10 Essential (primary) hypertension: Secondary | ICD-10-CM | POA: Diagnosis not present

## 2023-10-12 MED ORDER — BUPROPION HCL ER (XL) 150 MG PO TB24: 150.0000 mg | ORAL_TABLET | Freq: Every day | ORAL | 1 refills | Status: DC

## 2023-10-12 NOTE — Progress Notes (Signed)
 [facility]  Patient ID: Rhonda Bush,  MRN: [id number]  PCP: Sun, Vyvyan, MD  Date of Evaluation: 10/12/2023 Time spent:20 minutes  Chief Complaint:  Chief Complaint   Anxiety; Depression; Follow-up    HISTORY/CURRENT STATUS: HPI For 6 week med check.  Wellbutrin  was added at LOV.  States she feels good, not gaining any more weight and is slowly losing.  She already decreased the Cymbalta  as we had discussed at the last visit and hopes to get off of it.  She is able to enjoy things.  Energy and motivation are good, she stays busy and on the go all the time.  No extreme sadness, tearfulness, or feelings of hopelessness.  Sleeps well most of the time. ADLs and personal hygiene are normal.   Denies any changes in concentration, making decisions, or remembering things.  Rarely has anxiety.  She has Xanax  to take if needed but she has not needed it in a long time.  No mania, delirium, AH/VH.  No SI/HI.  Denies dizziness, syncope, seizures, numbness, tremor, tics, unsteady gait, slurred speech, confusion.   Individual Medical History/ Review of Systems: Changes? :Yes  in PT for pinched nerve in c-spine.  She had a steroid dose pack a few weeks ago for that.  It is getting better.  Past medications for mental health diagnoses include: Prozac , Xanax  for 1 month back in the 80s.   Allergies: Egg-derived products, Influenza vaccines, and Other  Current Medications:  Current Outpatient Medications:    albuterol  (VENTOLIN  HFA) 108 (90 Base) MCG/ACT inhaler, Inhale 2 puffs into the lungs every 4 (four) hours as needed for wheezing or shortness of breath (coughing fits)., Disp: 18 g, Rfl: 3   alendronate (FOSAMAX) 70 MG tablet, Take 70 mg by mouth once a week., Disp: , Rfl:    ALPRAZolam  (XANAX ) 0.25 MG tablet, Take 1-2 tablets (0.25-0.5 mg total) by mouth 2 (two) times daily as needed for anxiety., Disp: 60 tablet, Rfl: 0   Alum Hydroxide-Mag Trisilicate (GAVISCON) 80-14.2 MG  CHEW, 2 tablets after meals and at bedtime as needed, Disp: , Rfl:    amLODipine  (NORVASC ) 5 MG tablet, Take 5 mg by mouth daily after breakfast. , Disp: , Rfl:    cholecalciferol (VITAMIN D3) 25 MCG (1000 UT) tablet, Take 1,000 Units by mouth daily after breakfast. , Disp: , Rfl:    cyanocobalamin 1000 MCG tablet, Take by mouth., Disp: , Rfl:    dorzolamide  (TRUSOPT ) 2 % ophthalmic solution, Place 1 drop into both eyes 2 (two) times daily., Disp: , Rfl:    fluticasone -salmeterol (ADVAIR ) 250-50 MCG/ACT AEPB, Inhale 1 puff into the lungs every 12 (twelve) hours., Disp: 60 each, Rfl: 11   furosemide (LASIX) 20 MG tablet, Take 20 mg by mouth daily. As needed, Disp: , Rfl:    gabapentin  (NEURONTIN ) 100 MG capsule, Take 1 capsule (100 mg total) by mouth 3 (three) times daily., Disp: 30 capsule, Rfl: 3   hydrochlorothiazide (HYDRODIURIL) 25 MG tablet, , Disp: , Rfl:    hydroxychloroquine (PLAQUENIL) 200 MG tablet, See admin instructions., Disp: , Rfl:    latanoprost  (XALATAN ) 0.005 % ophthalmic solution, Place 1 drop into both eyes at bedtime., Disp: , Rfl:    metoprolol  succinate (TOPROL -XL) 100 MG 24 hr tablet, Take 100 mg by mouth daily after breakfast. Take with or immediately following a meal., Disp: , Rfl:    Oyster Shell Calcium 500 MG TABS, 1 tablet with meals, Disp: , Rfl:    telmisartan (  MICARDIS) 80 MG tablet, Take 80 mg by mouth daily after breakfast., Disp: , Rfl:    Vitamin D-Vitamin K (VITAMIN K2-VITAMIN D3) 45-2000 MCG-UNIT CAPS, 1 tablet, Disp: , Rfl:    buPROPion  (WELLBUTRIN  XL) 150 MG 24 hr tablet, Take 1 tablet (150 mg total) by mouth daily., Disp: 90 tablet, Rfl: 1   cetirizine  (ZYRTEC ) 10 MG tablet, Take 1 tablet (10 mg total) by mouth daily., Disp: 30 tablet, Rfl: 5   ibandronate (BONIVA) 150 MG tablet, Take 150 mg by mouth every 30 (thirty) days. (Patient not taking: Reported on 02/28/2023), Disp: , Rfl:    methotrexate  (RHEUMATREX) 2.5 MG tablet, Take 12.5 mg by mouth every  Monday. (Patient not taking: Reported on 09/08/2022), Disp: , Rfl:  Medication Side Effects: none  Family Medical/ Social History: Changes? No  MENTAL HEALTH EXAM:  There were no vitals taken for this visit.There is no height or weight on file to calculate BMI.  General Appearance: Casual, Well Groomed, and Obese  Eye Contact:  Good  Speech:  Clear and Coherent and Normal Rate  Volume:  Normal  Mood:  Euthymic  Affect:  Congruent  Thought Process:  Goal Directed and Descriptions of Associations: Circumstantial  Orientation:  Full (Time, Place, and Person)  Thought Content: Logical   Suicidal Thoughts:  No  Homicidal Thoughts:  No  Memory:  WNL  Judgement:  Good  Insight:  Good  Psychomotor Activity:  Normal  Concentration:  Concentration: Good  Recall:  Good  Fund of Knowledge: Good  Language: Good  Assets:  Communication Skills Desire for Improvement Financial Resources/Insurance Housing Leisure Time Resilience Social Support Transportation  ADL's:  Intact  Cognition: WNL  Prognosis:  Good   DIAGNOSES:    ICD-10-CM   1. Recurrent major depression in full remission (HCC)  F33.42     2. Generalized anxiety disorder  F41.1     3. Weight gain due to medication  R63.5    T50.905A      Receiving Psychotherapy: Yes   with Cato Sprang, [facility]  RECOMMENDATIONS:  PDMP reviewed.  Gabapentin  filled 09/16/2023.  Xanax  filled 09/08/2022. I provided approximately 20 minutes of face to face time during this encounter, including time spent before and after the visit in records review, medical decision making, counseling pertinent to today's visit, and charting.   I am glad to see her doing so well!    Weaning completely off the Cymbalta  is appropriate.  She will decrease to 30 mg daily for 1 week and then stop.  She knows to call if she has any withdrawals, those were explained to her.  Continue Xanax  0.25 mg, 1-2 p.o. twice daily as needed.  She rarely takes. Continue  Wellbutrin  XL 150 mg, 1 p.o. every morning. Continue therapy with Deane Sprang, [facility]. Return in 3 months.  Verneita Cooks, PA-C

## 2023-10-15 NOTE — Therapy (Signed)
 OUTPATIENT PHYSICAL THERAPY CERVICAL TREATMENT   Patient Name: Rhonda Bush MRN: [id number] DOB:09/10/1950, 73 y.o., female Today's Date: 10/16/2023  END OF SESSION:  PT End of Session - 10/16/23 0842     Visit Number 5    Number of Visits 16    Date for PT Re-Evaluation 11/16/23    Authorization Type [employer]    PT Start Time 0800    PT Stop Time 0839    PT Time Calculation (min) 39 min    Activity Tolerance Patient tolerated treatment well    Behavior During Therapy WFL for tasks assessed/performed              Past Medical History:  Diagnosis Date   Anxiety    Arthritis    rheumatoid arthritis -mild(feet,ankles,hand)-not bothersome now   Asthma    mild- not routine use of meds or inhalers   BURSITIS, LEFT SHOULDER 12/18/2006   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/2009   has improved   DEPRESSION 09/25/2006   GLAUCOMA NOS 09/25/2006   History of hiatal hernia    HYPERTENSION 09/25/2006   MIGRAINE HEADACHE 10/14/2006   OSTEOPOROSIS NOS 10/16/2006   PELVIC REGION, PAIN 12/17/2009   RESTLESS LEG SYNDROME, SEVERE 10/16/2006   not bothered now   SYNDROME, PREMENSTRUAL TENSION 10/14/2006   Past Surgical History:  Procedure Laterality Date   ABDOMINAL HYSTERECTOMY  1992 ,  [place] in 1994   [place]-fibro[place]    BREAST BIOPSY Right    ESOPHAGOGASTRODUODENOSCOPY (EGD) WITH PROPOFOL  N/A 02/13/2013   Procedure: ESOPHAGOGASTRODUODENOSCOPY (EGD) WITH PROPOFOL ;  Surgeon: Elsie Cree, MD;  Location: [facility];  Service: Endoscopy;  Laterality: N/A;   EUS N/A 02/13/2013   Procedure: ESOPHAGEAL ENDOSCOPIC ULTRASOUND (EUS) RADIAL;  Surgeon: Elsie Cree, MD;  Location: [facility];  Service: Endoscopy;  Laterality: N/A;   PARATHYROIDECTOMY N/A 10/18/2018   Procedure: PARATHYROIDECTOMY;  Surgeon: Eletha Boas, MD;  Location: [facility];  Service: General;  Laterality: N/A;   PARATHYROIDECTOMY N/A 10/23/2018   Procedure: NECK EXPLORATION, EVACUATION OF  HEMATOMA;  Surgeon: Eletha Boas, MD;  Location: [facility];  Service: General;  Laterality: N/A;   Patient Active Problem List   Diagnosis Date Noted   Radiculopathy, cervical region 09/01/2023   Heartburn 10/12/2020   Moderate persistent asthma without complication 07/08/2020   Hematoma of neck 10/23/2018   Neck swelling 10/22/2018   Hyperparathyroidism, primary (HCC) 10/13/2018   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/2009   BURSITIS, LEFT SHOULDER 12/18/2006   RESTLESS LEG SYNDROME, SEVERE 10/16/2006   OSTEOPOROSIS NOS 10/16/2006   MIGRAINE HEADACHE 10/14/2006   SYNDROME, PREMENSTRUAL TENSION 10/14/2006   DEPRESSION 09/25/2006   GLAUCOMA NOS 09/25/2006   HYPERTENSION 09/25/2006    PCP: Arnett Repress, MD  REFERRING PROVIDER: Kay CHRISTELLA Cummins, MD  REFERRING DIAG: M54.12 (ICD-10-CM) - Radiculopathy, cervical region  THERAPY DIAG:  Abnormal posture  Radiculopathy, cervical region  Muscle weakness (generalized)  Rationale for Evaluation and Treatment: Rehabilitation  ONSET DATE: 2 months  SUBJECTIVE:  SUBJECTIVE STATEMENT: Pt states pain has reduced and is only located in mid R deltoid area affecting reaching.  Hand dominance: Right  PERTINENT HISTORY:  Rheumatoid arthritis, asthma, HTN, migraines  PAIN:   Are you having pain? Yes: NPRS scale: Occurrence less but max 7/10  Pain location: Neck to right hand Pain description: Tingling upper extremity, stiff and sore neck Aggravating factors: Not noted Relieving factors: Not noted  PRECAUTIONS: Cervical  RED FLAGS: None     WEIGHT BEARING RESTRICTIONS: No  FALLS:  Has patient fallen in last 6 months? No  LIVING ENVIRONMENT: Lives with: lives alone Lives in: House/apartment Stairs: Does OK with stairs Has  following equipment at home: None  OCCUPATION: Retired  PLOF: Independent  PATIENT GOALS: Be able to use the right side without baby(ing) it.  NEXT MD VISIT: NA  OBJECTIVE:  Note: Objective measures were completed at Evaluation unless otherwise noted.  DIAGNOSTIC FINDINGS:  Degenerative spondylosis at C4-5, C5-6 and C6-7. No compressive central canal stenosis. Foraminal narrowing that could possibly cause neural compression on either side at C4-5, C5-6 and C6-7.  PATIENT SURVEYS:  PSFS: THE PATIENT SPECIFIC FUNCTIONAL SCALE  Place score of 0-10 (0 = unable to perform activity and 10 = able to perform activity at the same level as before injury or problem)  Activity Date: 09/21/2023    Writing 5/10    2.   Sleeping 7/10    3.   Driving  2/89    4.      Total Score 6.33      Total Score = Sum of activity scores/number of activities  Minimally Detectable Change: 3 points (for single activity); 2 points (for average score)  [facility] (nd). The Patient Specific Functional Scale . Retrieved from [email or web address]   COGNITION: Overall cognitive status: Within functional limits for tasks assessed  SENSATION: Tingling and numbness of the right upper extremity as distal as the hand  POSTURE: rounded shoulders, forward head, and decreased lumbar lordosis     CERVICAL ROM:   Active ROM A/PROM (deg) eval  Flexion   Extension 60  Right lateral flexion 20  Left lateral flexion 15  Right rotation 40  Left rotation 45   (Blank rows = not tested)  UPPER EXTREMITY ROM:  Active ROM Left/Right 09/21/2023   Shoulder flexion    Shoulder extension    Shoulder abduction    Shoulder adduction    Shoulder extension    Shoulder internal rotation    Shoulder external rotation    Elbow flexion    Elbow extension    Wrist flexion    Wrist extension    Wrist ulnar deviation    Wrist radial deviation     Wrist pronation    Wrist supination     (Blank rows = not tested)  UPPER EXTREMITY STRENGTH:  In pounds assessed with hand-held dynamometer Left/Right 09/21/2023   Shoulder flexion    Shoulder extension    Shoulder abduction    Shoulder adduction    Shoulder extension    Shoulder internal rotation    Shoulder external rotation    Middle trapezius    Lower trapezius    Elbow flexion    Elbow extension    Wrist flexion    Wrist extension    Wrist ulnar deviation    Wrist radial deviation    Wrist pronation    Wrist supination    Grip strength    Cervical Extension 14.8 pounds   Cervical  Lateral Bending 5.3/5.9    (Blank rows = not tested)  TREATMENT DATE:  10/16/23  Seated:  UBE backwards for posture 7 min Scapular retraction/shoulder blade pinches 10 x 5 seconds Cervical rotation active range of motion with shoulders back 10 x 5 seconds Cervical extension isometrics with shoulders back 10 x 5 seconds Standing: Shoulder flexion towel slides on wall 15x Bilateral ER with T Band Red 3x10 Shoulder ext with T band green 3x10 Shoulder rows with Tband green 3x10 Supine: Cervical rotation on pillow 10x Cervical retraction supine on pillow 20x 5 sec Manual: Cervical traction     10/11/23  1345-1428 Seated:  UBE backwards for posture 6 min Scapular retraction/shoulder blade pinches 10 x 5 seconds Cervical rotation active range of motion with shoulders back 10 x 5 seconds Cervical extension isometrics with shoulders back 10 x 5 seconds Cervical retractions 2x10 Standing: Shoulder flexion towel slides on wall Bilateral ER with T Band Red 3x10 Shoulder ext with T band green 3x10 Shoulder rows with Tband green 3x10 Supine: Cervical rotation on ball or pillow Cervical retraction supine on pillow 20x Manual: Cervical traction        10/09/23 Seated:  UBE backwards for posture 5 min Scapular retraction/shoulder blade pinches 10 x 5 seconds Cervical rotation  active range of motion with shoulders back 10 x 5 seconds Cervical extension isometrics with shoulders back 10 x 5 seconds Cervical retractions 2x10 Standing: Shoulder flexion towel slides on wall Bilateral ER with T Band Green 2x10 Shoulder ext with T band green 3x10 Shoulder rows with Tband green 3x10 Supine: Cervical rotation on ball or pillow Cervical retraction supine on pillow 20x Manual: Cervical traction                                                                                                                            PATIENT EDUCATION:  Education details: See above Person educated: Patient Education method: Programmer, multimedia, Demonstration, Tactile cues, Verbal cues, and Handouts Education comprehension: verbalized understanding, returned demonstration, verbal cues required, tactile cues required, and needs further education  HOME EXERCISE PROGRAM: Access Code: [email or web address] URL: [email or web address] Date: 09/21/2023 Prepared by: Lamar Ivory  Exercises - Standing Scapular Retraction  - 5 x daily - 7 x weekly - 1 sets - 5 reps - 5 second hold - Seated Cervical Rotation AROM  - 3-5 x daily - 7 x weekly - 1 sets - 10 reps - 5 seconds hold - Standing Isometric Cervical Extension with Manual Resistance  - 5 x daily - 7 x weekly - 1 sets - 5 reps - 5 hold  ASSESSMENT:  CLINICAL IMPRESSION: Pt demonstrated increased endurance with increased time on UBE.     OBJECTIVE IMPAIRMENTS: decreased activity tolerance, decreased endurance, decreased knowledge of condition, decreased ROM, decreased strength, decreased safety awareness, increased fascial restrictions, impaired perceived functional ability, increased muscle spasms, impaired UE functional use, improper body mechanics, postural dysfunction, and pain.   ACTIVITY LIMITATIONS: carrying, lifting, sleeping,  and reach over head  PARTICIPATION LIMITATIONS: meal prep, cleaning, and community activity  PERSONAL  FACTORS: Rheumatoid arthritis, asthma, HTN, migraines are also affecting patient's functional outcome.   REHAB POTENTIAL: Good  CLINICAL DECISION MAKING: Stable/uncomplicated  EVALUATION COMPLEXITY: Low   GOALS: Goals reviewed with patient? Yes  SHORT TERM GOALS: Target date: 10/19/2023  [name] will be independent with her day 1 home exercise program Baseline: Started 09/21/2023 Goal status: INITIAL  2.  Improve cervical active range of motion for extension to 65; lateral bending to 25 and rotation to at least 50 degrees Baseline: 60; 15/20 and 45/40 respectively Goal status: INITIAL  3.  Improve cervical extension strength to at least 25 pounds Baseline: 14.8 pounds Goal status: INITIAL   LONG TERM GOALS: Target date: 11/16/2023  Improve patient's specific functional score to at least 9.33 Baseline: 6.33 Goal status: INITIAL  2.  [name] will report cervical pain no greater than 3/10 on the numeric pain rating scale with no complaints of scapular pain and right upper extremity radicular symptoms Baseline: Right upper extremity radicular symptoms as distal as the hand as high as 5/10 Goal status: INITIAL  3.  Improve cervical active range of motion for rotation to at least 60 degrees Baseline: 45/40 degrees Goal status: INITIAL  4.  Improve cervical extension strength to at least 30 pounds and lateral bending to at least 18 pounds Baseline: 14.8 and 5.3/5.9 respectively Goal status: INITIAL  5.  [name] will be independent with her long-term maintenance home exercise program at discharge Baseline: Started 09/21/2023 Goal status: INITIAL   PLAN:  PT FREQUENCY: 1-2x/week  PT DURATION: 8 weeks  PLANNED INTERVENTIONS: 97110-Therapeutic exercises, 97530- Therapeutic activity, 97112- Neuromuscular re-education, 97535- Self Care, [id number]- Manual therapy, 97012- Traction (mechanical), 20560 (1-2 muscles), 20561 (3+ muscles)- Dry Needling, Patient/Family education, Spinal mobilization,  Cryotherapy, and Moist heat  PLAN FOR NEXT SESSION:  Increase bicep/ tricep exercise.  Burnard Meth, PT 10/16/23  8:43 AM

## 2023-10-16 ENCOUNTER — Ambulatory Visit

## 2023-10-16 DIAGNOSIS — R293 Abnormal posture: Secondary | ICD-10-CM

## 2023-10-16 DIAGNOSIS — M5412 Radiculopathy, cervical region: Secondary | ICD-10-CM

## 2023-10-16 DIAGNOSIS — M6281 Muscle weakness (generalized): Secondary | ICD-10-CM

## 2023-10-17 NOTE — Therapy (Signed)
 OUTPATIENT PHYSICAL THERAPY CERVICAL TREATMENT   Patient Name: Rhonda Bush MRN: [id number] DOB:1950/06/12, 73 y.o., female Today's Date: 10/18/2023  END OF SESSION:  PT End of Session - 10/18/23 1414     Visit Number 6    Number of Visits 16    Date for PT Re-Evaluation 11/16/23    Authorization Type [employer]    PT Start Time 1330    PT Stop Time 1410    PT Time Calculation (min) 40 min    Activity Tolerance Patient tolerated treatment well    Behavior During Therapy WFL for tasks assessed/performed               Past Medical History:  Diagnosis Date   Anxiety    Arthritis    rheumatoid arthritis -mild(feet,ankles,hand)-not bothersome now   Asthma    mild- not routine use of meds or inhalers   BURSITIS, LEFT SHOULDER 12/18/2006   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/2009   has improved   DEPRESSION 09/25/2006   GLAUCOMA NOS 09/25/2006   History of hiatal hernia    HYPERTENSION 09/25/2006   MIGRAINE HEADACHE 10/14/2006   OSTEOPOROSIS NOS 10/16/2006   PELVIC REGION, PAIN 12/17/2009   RESTLESS LEG SYNDROME, SEVERE 10/16/2006   not bothered now   SYNDROME, PREMENSTRUAL TENSION 10/14/2006   Past Surgical History:  Procedure Laterality Date   ABDOMINAL HYSTERECTOMY  1992 ,  [place] total in 1994   [place] bso-fibro[place]    BREAST BIOPSY Right    ESOPHAGOGASTRODUODENOSCOPY (EGD) WITH PROPOFOL  N/A 02/13/2013   Procedure: ESOPHAGOGASTRODUODENOSCOPY (EGD) WITH PROPOFOL ;  Surgeon: Elsie Cree, MD;  Location: [facility];  Service: Endoscopy;  Laterality: N/A;   EUS N/A 02/13/2013   Procedure: ESOPHAGEAL ENDOSCOPIC ULTRASOUND (EUS) RADIAL;  Surgeon: Elsie Cree, MD;  Location: [facility];  Service: Endoscopy;  Laterality: N/A;   PARATHYROIDECTOMY N/A 10/18/2018   Procedure: PARATHYROIDECTOMY;  Surgeon: Eletha Boas, MD;  Location: [facility];  Service: General;  Laterality: N/A;   PARATHYROIDECTOMY N/A 10/23/2018   Procedure: NECK EXPLORATION, EVACUATION OF  HEMATOMA;  Surgeon: Eletha Boas, MD;  Location: [facility];  Service: General;  Laterality: N/A;   Patient Active Problem List   Diagnosis Date Noted   Radiculopathy, cervical region 09/01/2023   Heartburn 10/12/2020   Moderate persistent asthma without complication 07/08/2020   Hematoma of neck 10/23/2018   Neck swelling 10/22/2018   Hyperparathyroidism, primary (HCC) 10/13/2018   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/2009   BURSITIS, LEFT SHOULDER 12/18/2006   RESTLESS LEG SYNDROME, SEVERE 10/16/2006   OSTEOPOROSIS NOS 10/16/2006   MIGRAINE HEADACHE 10/14/2006   SYNDROME, PREMENSTRUAL TENSION 10/14/2006   DEPRESSION 09/25/2006   GLAUCOMA NOS 09/25/2006   HYPERTENSION 09/25/2006    PCP: Arnett Repress, MD  REFERRING PROVIDER: Kay CHRISTELLA Cummins, MD  REFERRING DIAG: M54.12 (ICD-10-CM) - Radiculopathy, cervical region  THERAPY DIAG:  Abnormal posture  Radiculopathy, cervical region  Muscle weakness (generalized)  Rationale for Evaluation and Treatment: Rehabilitation  ONSET DATE: 2 months  SUBJECTIVE:  SUBJECTIVE STATEMENT: Pt states continued pain and numbness at R deltoid area. Radiating pain reduced to 1x a day.  Hand dominance: Right  PERTINENT HISTORY:  Rheumatoid arthritis, asthma, HTN, migraines  PAIN:   Are you having pain? Yes: NPRS scale: Occurrence less but max 5/10  Pain location: Neck to right hand Pain description: Tingling upper extremity, stiff and sore neck Aggravating factors: Not noted Relieving factors: Not noted  PRECAUTIONS: Cervical  RED FLAGS: None     WEIGHT BEARING RESTRICTIONS: No  FALLS:  Has patient fallen in last 6 months? No  LIVING ENVIRONMENT: Lives with: lives alone Lives in: House/apartment Stairs: Does OK with stairs Has  following equipment at home: None  OCCUPATION: Retired  PLOF: Independent  PATIENT GOALS: Be able to use the right side without baby(ing) it.  NEXT MD VISIT: NA  OBJECTIVE:  Note: Objective measures were completed at Evaluation unless otherwise noted.  DIAGNOSTIC FINDINGS:  Degenerative spondylosis at C4-5, C5-6 and C6-7. No compressive central canal stenosis. Foraminal narrowing that could possibly cause neural compression on either side at C4-5, C5-6 and C6-7.  PATIENT SURVEYS:  PSFS: THE PATIENT SPECIFIC FUNCTIONAL SCALE  Place score of 0-10 (0 = unable to perform activity and 10 = able to perform activity at the same level as before injury or problem)  Activity Date: 09/21/2023    Writing 5/10    2.   Sleeping 7/10    3.   Driving  2/89    4.      Total Score 6.33      Total Score = Sum of activity scores/number of activities  Minimally Detectable Change: 3 points (for single activity); 2 points (for average score)  [facility] (nd). The Patient Specific Functional Scale . Retrieved from [email or web address]   COGNITION: Overall cognitive status: Within functional limits for tasks assessed  SENSATION: Tingling and numbness of the right upper extremity as distal as the hand  POSTURE: rounded shoulders, forward head, and decreased lumbar lordosis     CERVICAL ROM:   Active ROM A/PROM (deg) eval  Flexion   Extension 60  Right lateral flexion 20  Left lateral flexion 15  Right rotation 40  Left rotation 45   (Blank rows = not tested)  UPPER EXTREMITY ROM:  Active ROM Left/Right 09/21/2023   Shoulder flexion    Shoulder extension    Shoulder abduction    Shoulder adduction    Shoulder extension    Shoulder internal rotation    Shoulder external rotation    Elbow flexion    Elbow extension    Wrist flexion    Wrist extension    Wrist ulnar deviation    Wrist radial deviation     Wrist pronation    Wrist supination     (Blank rows = not tested)  UPPER EXTREMITY STRENGTH:  In pounds assessed with hand-held dynamometer Left/Right 09/21/2023   Shoulder flexion    Shoulder extension    Shoulder abduction    Shoulder adduction    Shoulder extension    Shoulder internal rotation    Shoulder external rotation    Middle trapezius    Lower trapezius    Elbow flexion    Elbow extension    Wrist flexion    Wrist extension    Wrist ulnar deviation    Wrist radial deviation    Wrist pronation    Wrist supination    Grip strength    Cervical Extension 14.8 pounds  Cervical Lateral Bending 5.3/5.9    (Blank rows = not tested)  TREATMENT DATE:  10/18/23  Seated:  UBE backwards for posture 8 min Scapular retraction/shoulder blade pinches 10 x 5 seconds Cervical rotation active range of motion with shoulders back 10 x 5 seconds Cervical extension isometrics with shoulders back 10 x 5 seconds Standing: Shoulder flexion towel slides on wall 15x Bilateral ER with T Band Red 3x10 Shoulder ext with T band green 3x10 Shoulder rows with Tband green 3x10 Supine: Cervical rotation on pillow 10x Cervical retraction supine on pillow 20x 5 sec Manual: Cervical traction    10/16/23  Seated:  UBE backwards for posture 7 min Scapular retraction/shoulder blade pinches 10 x 5 seconds Cervical rotation active range of motion with shoulders back 10 x 5 seconds Cervical extension isometrics with shoulders back 10 x 5 seconds Standing: Shoulder flexion towel slides on wall 15x Bilateral ER with T Band Red 3x10 Shoulder ext with T band green 3x10 Shoulder rows with Tband green 3x10 Supine: Cervical rotation on pillow 10x Cervical retraction supine on pillow 20x 5 sec Manual: Cervical traction     10/11/23  1345-1428 Seated:  UBE backwards for posture 6 min Scapular retraction/shoulder blade pinches 10 x 5 seconds Cervical rotation active range of motion with  shoulders back 10 x 5 seconds Cervical extension isometrics with shoulders back 10 x 5 seconds Cervical retractions 2x10 Standing: Shoulder flexion towel slides on wall Bilateral ER with T Band Red 3x10 Shoulder ext with T band green 3x10 Shoulder rows with Tband green 3x10 Supine: Cervical rotation on ball or pillow Cervical retraction supine on pillow 20x Bicep curls with 2# bar 2x10 Manual: Cervical traction                                                                                                                            PATIENT EDUCATION:  Education details: See above Person educated: Patient Education method: Programmer, multimedia, Demonstration, Tactile cues, Verbal cues, and Handouts Education comprehension: verbalized understanding, returned demonstration, verbal cues required, tactile cues required, and needs further education  HOME EXERCISE PROGRAM: Access Code: [email or web address] URL: [email or web address] Date: 09/21/2023 Prepared by: Lamar Ivory  Exercises - Standing Scapular Retraction  - 5 x daily - 7 x weekly - 1 sets - 5 reps - 5 second hold - Seated Cervical Rotation AROM  - 3-5 x daily - 7 x weekly - 1 sets - 10 reps - 5 seconds hold - Standing Isometric Cervical Extension with Manual Resistance  - 5 x daily - 7 x weekly - 1 sets - 5 reps - 5 hold  ASSESSMENT:  CLINICAL IMPRESSION: Pt needed VC for elbow position with Rows.  Demonstrated understanding. OBJECTIVE IMPAIRMENTS: decreased activity tolerance, decreased endurance, decreased knowledge of condition, decreased ROM, decreased strength, decreased safety awareness, increased fascial restrictions, impaired perceived functional ability, increased muscle spasms, impaired UE functional use, improper body mechanics, postural dysfunction, and pain.   ACTIVITY LIMITATIONS: carrying, lifting,  sleeping, and reach over head  PARTICIPATION LIMITATIONS: meal prep, cleaning, and community activity  PERSONAL  FACTORS: Rheumatoid arthritis, asthma, HTN, migraines are also affecting patient's functional outcome.   REHAB POTENTIAL: Good  CLINICAL DECISION MAKING: Stable/uncomplicated  EVALUATION COMPLEXITY: Low   GOALS: Goals reviewed with patient? Yes  SHORT TERM GOALS: Target date: 10/19/2023  [name] will be independent with her day 1 home exercise program Baseline: Started 09/21/2023 Goal status: INITIAL  2.  Improve cervical active range of motion for extension to 65; lateral bending to 25 and rotation to at least 50 degrees Baseline: 60; 15/20 and 45/40 respectively Goal status: INITIAL  3.  Improve cervical extension strength to at least 25 pounds Baseline: 14.8 pounds Goal status: INITIAL   LONG TERM GOALS: Target date: 11/16/2023  Improve patient's specific functional score to at least 9.33 Baseline: 6.33 Goal status: INITIAL  2.  [name] will report cervical pain no greater than 3/10 on the numeric pain rating scale with no complaints of scapular pain and right upper extremity radicular symptoms Baseline: Right upper extremity radicular symptoms as distal as the hand as high as 5/10 Goal status: INITIAL  3.  Improve cervical active range of motion for rotation to at least 60 degrees Baseline: 45/40 degrees Goal status: INITIAL  4.  Improve cervical extension strength to at least 30 pounds and lateral bending to at least 18 pounds Baseline: 14.8 and 5.3/5.9 respectively Goal status: INITIAL  5.  [name] will be independent with her long-term maintenance home exercise program at discharge Baseline: Started 09/21/2023 Goal status: INITIAL   PLAN:  PT FREQUENCY: 1-2x/week  PT DURATION: 8 weeks  PLANNED INTERVENTIONS: 97110-Therapeutic exercises, 97530- Therapeutic activity, 97112- Neuromuscular re-education, 97535- Self Care, 02859- Manual therapy, 97012- Traction (mechanical), 20560 (1-2 muscles), 20561 (3+ muscles)- Dry Needling, Patient/Family education, Spinal mobilization,  Cryotherapy, and Moist heat  PLAN FOR NEXT SESSION: Continue with postural exercises.  Burnard Meth, PT 10/18/23  2:15 PM

## 2023-10-18 ENCOUNTER — Ambulatory Visit

## 2023-10-18 DIAGNOSIS — R293 Abnormal posture: Secondary | ICD-10-CM | POA: Diagnosis not present

## 2023-10-18 DIAGNOSIS — M5412 Radiculopathy, cervical region: Secondary | ICD-10-CM

## 2023-10-18 DIAGNOSIS — M6281 Muscle weakness (generalized): Secondary | ICD-10-CM | POA: Diagnosis not present

## 2023-10-20 NOTE — Therapy (Signed)
 OUTPATIENT PHYSICAL THERAPY CERVICAL TREATMENT   Patient Name: Rhonda Bush MRN: [id number] DOB:1950/09/30, 73 y.o., female Today's Date: 10/23/2023  END OF SESSION:  PT End of Session - 10/23/23 0832     Visit Number 7    Number of Visits 16    Date for PT Re-Evaluation 11/16/23    Authorization Type [employer]    PT Start Time 0805    PT Stop Time 0843    PT Time Calculation (min) 38 min    Activity Tolerance Patient tolerated treatment well    Behavior During Therapy WFL for tasks assessed/performed                Past Medical History:  Diagnosis Date   Anxiety    Arthritis    rheumatoid arthritis -mild(feet,ankles,hand)-not bothersome now   Asthma    mild- not routine use of meds or inhalers   BURSITIS, LEFT SHOULDER 12/18/2006   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/2009   has improved   DEPRESSION 09/25/2006   GLAUCOMA NOS 09/25/2006   History of hiatal hernia    HYPERTENSION 09/25/2006   MIGRAINE HEADACHE 10/14/2006   OSTEOPOROSIS NOS 10/16/2006   PELVIC REGION, PAIN 12/17/2009   RESTLESS LEG SYNDROME, SEVERE 10/16/2006   not bothered now   SYNDROME, PREMENSTRUAL TENSION 10/14/2006   Past Surgical History:  Procedure Laterality Date   ABDOMINAL HYSTERECTOMY  1992 ,  made total in 1994   [place] - bso-fibroids    BREAST BIOPSY Right    ESOPHAGOGASTRODUODENOSCOPY (EGD) WITH PROPOFOL  N/A 02/13/2013   Procedure: ESOPHAGOGASTRODUODENOSCOPY (EGD) WITH PROPOFOL ;  Surgeon: Elsie Cree, MD;  Location: [facility];  Service: Endoscopy;  Laterality: N/A;   EUS N/A 02/13/2013   Procedure: ESOPHAGEAL ENDOSCOPIC ULTRASOUND (EUS) RADIAL;  Surgeon: Elsie Cree, MD;  Location: [facility];  Service: Endoscopy;  Laterality: N/A;   PARATHYROIDECTOMY N/A 10/18/2018   Procedure: PARATHYROIDECTOMY;  Surgeon: Eletha Boas, MD;  Location: [facility];  Service: General;  Laterality: N/A;   PARATHYROIDECTOMY N/A 10/23/2018   Procedure: NECK EXPLORATION, EVACUATION  OF HEMATOMA;  Surgeon: Eletha Boas, MD;  Location: [facility];  Service: General;  Laterality: N/A;   Patient Active Problem List   Diagnosis Date Noted   Radiculopathy, cervical region 09/01/2023   Heartburn 10/12/2020   Moderate persistent asthma without complication 07/08/2020   Hematoma of neck 10/23/2018   Neck swelling 10/22/2018   Hyperparathyroidism, primary (HCC) 10/13/2018   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/2009   BURSITIS, LEFT SHOULDER 12/18/2006   RESTLESS LEG SYNDROME, SEVERE 10/16/2006   OSTEOPOROSIS NOS 10/16/2006   MIGRAINE HEADACHE 10/14/2006   SYNDROME, PREMENSTRUAL TENSION 10/14/2006   DEPRESSION 09/25/2006   GLAUCOMA NOS 09/25/2006   HYPERTENSION 09/25/2006    PCP: Arnett Repress, MD  REFERRING PROVIDER: Kay CHRISTELLA Cummins, MD  REFERRING DIAG: M54.12 (ICD-10-CM) - Radiculopathy, cervical region  THERAPY DIAG:  Abnormal posture  Radiculopathy, cervical region  Muscle weakness (generalized)  Rationale for Evaluation and Treatment: Rehabilitation  ONSET DATE: 2 months  SUBJECTIVE:  SUBJECTIVE STATEMENT: Pt reports feeling arm pain on/off all weekend, but intensity.   Hand dominance: Right  PERTINENT HISTORY:  Rheumatoid arthritis, asthma, HTN, migraines  PAIN:   Are you having pain? Yes: NPRS scale: Occurrence less but max 4/10  Pain location: Neck to right hand Pain description: Tingling upper extremity, stiff and sore neck Aggravating factors: Not noted Relieving factors: Not noted  PRECAUTIONS: Cervical  RED FLAGS: None     WEIGHT BEARING RESTRICTIONS: No  FALLS:  Has patient fallen in last 6 months? No  LIVING ENVIRONMENT: Lives with: lives alone Lives in: House/apartment Stairs: Does OK with stairs Has following equipment at  home: None  OCCUPATION: Retired  PLOF: Independent  PATIENT GOALS: Be able to use the right side without baby(ing) it.  NEXT MD VISIT: NA  OBJECTIVE:  Note: Objective measures were completed at Evaluation unless otherwise noted.  DIAGNOSTIC FINDINGS:  Degenerative spondylosis at C4-5, C5-6 and C6-7. No compressive central canal stenosis. Foraminal narrowing that could possibly cause neural compression on either side at C4-5, C5-6 and C6-7.  PATIENT SURVEYS:  PSFS: THE PATIENT SPECIFIC FUNCTIONAL SCALE  Place score of 0-10 (0 = unable to perform activity and 10 = able to perform activity at the same level as before injury or problem)  Activity Date: 09/21/2023    Writing 5/10    2.   Sleeping 7/10    3.   Driving  2/89    4.      Total Score 6.33      Total Score = Sum of activity scores/number of activities  Minimally Detectable Change: 3 points (for single activity); 2 points (for average score)  [facility] (nd). The Patient Specific Functional Scale . Retrieved from [email or web address]   COGNITION: Overall cognitive status: Within functional limits for tasks assessed  SENSATION: Tingling and numbness of the right upper extremity as distal as the hand  POSTURE: rounded shoulders, forward head, and decreased lumbar lordosis     CERVICAL ROM:   Active ROM A/PROM (deg) eval  Flexion   Extension 60  Right lateral flexion 20  Left lateral flexion 15  Right rotation 40  Left rotation 45   (Blank rows = not tested)  UPPER EXTREMITY ROM:  Active ROM Left/Right 09/21/2023   Shoulder flexion    Shoulder extension    Shoulder abduction    Shoulder adduction    Shoulder extension    Shoulder internal rotation    Shoulder external rotation    Elbow flexion    Elbow extension    Wrist flexion    Wrist extension    Wrist ulnar deviation    Wrist radial deviation    Wrist pronation    Wrist  supination     (Blank rows = not tested)  UPPER EXTREMITY STRENGTH:  In pounds assessed with hand-held dynamometer Left/Right 09/21/2023   Shoulder flexion    Shoulder extension    Shoulder abduction    Shoulder adduction    Shoulder extension    Shoulder internal rotation    Shoulder external rotation    Middle trapezius    Lower trapezius    Elbow flexion    Elbow extension    Wrist flexion    Wrist extension    Wrist ulnar deviation    Wrist radial deviation    Wrist pronation    Wrist supination    Grip strength    Cervical Extension 14.8 pounds   Cervical Lateral Bending 5.3/5.9    (  Blank rows = not tested)  TREATMENT DATE:  10/23/23 Seated:  UBE backwards for posture 8 min Scapular retraction/shoulder blade pinches 10 x 5 seconds Cervical extension isometrics with shoulders back 10 x 5 seconds Shoulder shrugs for scapular mobility 2x10 Standing: Shoulder flexion towel slides on wall 15x Bilateral ER with T Band Red 3x10 Shoulder ext with T band green 3x10 Shoulder rows with Tband green 3x10 Supine: Cervical rotation on pillow 10x Cervical retraction supine on pillow 20x 5 sec Manual: Cervical traction      10/18/23  Seated:  UBE backwards for posture 8 min Scapular retraction/shoulder blade pinches 10 x 5 seconds Cervical rotation active range of motion with shoulders back 10 x 5 seconds Cervical extension isometrics with shoulders back 10 x 5 seconds Standing: Shoulder flexion towel slides on wall 15x Bilateral ER with T Band Red 3x10 Shoulder ext with T band green 3x10 Shoulder rows with Tband green 3x10 Supine: Cervical rotation on pillow 10x Cervical retraction supine on pillow 20x 5 sec Manual: Cervical traction    10/16/23  Seated:  UBE backwards for posture 7 min Scapular retraction/shoulder blade pinches 10 x 5 seconds Cervical rotation active range of motion with shoulders back 10 x 5 seconds Cervical extension isometrics with  shoulders back 10 x 5 seconds Standing: Shoulder flexion towel slides on wall 15x Bilateral ER with T Band Red 3x10 Shoulder ext with T band green 3x10 Shoulder rows with Tband green 3x10 Supine: Cervical rotation on pillow 10x Cervical retraction supine on pillow 20x 5 sec Manual: Cervical traction     10/11/23  1345-1428 Seated:  UBE backwards for posture 6 min Scapular retraction/shoulder blade pinches 10 x 5 seconds Cervical rotation active range of motion with shoulders back 10 x 5 seconds Cervical extension isometrics with shoulders back 10 x 5 seconds Cervical retractions 2x10 Standing: Shoulder flexion towel slides on wall Bilateral ER with T Band Red 3x10 Shoulder ext with T band green 3x10 Shoulder rows with Tband green 3x10 Supine: Cervical rotation on ball or pillow Cervical retraction supine on pillow 20x Bicep curls with 2# bar 2x10 Manual: Cervical traction                                                                                                                            PATIENT EDUCATION:  Education details: See above Person educated: Patient Education method: Programmer, multimedia, Demonstration, Tactile cues, Verbal cues, and Handouts Education comprehension: verbalized understanding, returned demonstration, verbal cues required, tactile cues required, and needs further education  HOME EXERCISE PROGRAM: Access Code: [email or web address] URL: [email or web address] Date: 09/21/2023 Prepared by: Lamar Ivory  Exercises - Standing Scapular Retraction  - 5 x daily - 7 x weekly - 1 sets - 5 reps - 5 second hold - Seated Cervical Rotation AROM  - 3-5 x daily - 7 x weekly - 1 sets - 10 reps - 5 seconds hold - Standing Isometric Cervical Extension with Manual Resistance  - 5  x daily - 7 x weekly - 1 sets - 5 reps - 5 hold  ASSESSMENT:  CLINICAL IMPRESSION: Pt demonstrated self correction for posture with T band rows.  Needed VC for cervical retractions  in supine. OBJECTIVE IMPAIRMENTS: decreased activity tolerance, decreased endurance, decreased knowledge of condition, decreased ROM, decreased strength, decreased safety awareness, increased fascial restrictions, impaired perceived functional ability, increased muscle spasms, impaired UE functional use, improper body mechanics, postural dysfunction, and pain.   ACTIVITY LIMITATIONS: carrying, lifting, sleeping, and reach over head  PARTICIPATION LIMITATIONS: meal prep, cleaning, and community activity  PERSONAL FACTORS: Rheumatoid arthritis, asthma, HTN, migraines are also affecting patient's functional outcome.   REHAB POTENTIAL: Good  CLINICAL DECISION MAKING: Stable/uncomplicated  EVALUATION COMPLEXITY: Low   GOALS: Goals reviewed with patient? Yes  SHORT TERM GOALS: Target date: 10/19/2023  [name] will be independent with her day 1 home exercise program Baseline: Started 09/21/2023 Goal status: INITIAL  2.  Improve cervical active range of motion for extension to 65; lateral bending to 25 and rotation to at least 50 degrees Baseline: 60; 15/20 and 45/40 respectively Goal status: INITIAL  3.  Improve cervical extension strength to at least 25 pounds Baseline: 14.8 pounds Goal status: INITIAL   LONG TERM GOALS: Target date: 11/16/2023  Improve patient's specific functional score to at least 9.33 Baseline: 6.33 Goal status: INITIAL  2.  [name] will report cervical pain no greater than 3/10 on the numeric pain rating scale with no complaints of scapular pain and right upper extremity radicular symptoms Baseline: Right upper extremity radicular symptoms as distal as the hand as high as 5/10 Goal status: INITIAL  3.  Improve cervical active range of motion for rotation to at least 60 degrees Baseline: 45/40 degrees Goal status: INITIAL  4.  Improve cervical extension strength to at least 30 pounds and lateral bending to at least 18 pounds Baseline: 14.8 and 5.3/5.9  respectively Goal status: INITIAL  5.  [name] will be independent with her long-term maintenance home exercise program at discharge Baseline: Started 09/21/2023 Goal status: INITIAL   PLAN:  PT FREQUENCY: 1-2x/week  PT DURATION: 8 weeks  PLANNED INTERVENTIONS: 97110-Therapeutic exercises, 97530- Therapeutic activity, 97112- Neuromuscular re-education, 97535- Self Care, 02859- Manual therapy, 97012- Traction (mechanical), 20560 (1-2 muscles), 20561 (3+ muscles)- Dry Needling, Patient/Family education, Spinal mobilization, Cryotherapy, and Moist heat  PLAN FOR NEXT SESSION: Continue with postural exercises.  Burnard Meth, PT 10/23/23  8:33 AM

## 2023-10-23 ENCOUNTER — Ambulatory Visit

## 2023-10-23 DIAGNOSIS — M6281 Muscle weakness (generalized): Secondary | ICD-10-CM | POA: Diagnosis not present

## 2023-10-23 DIAGNOSIS — R293 Abnormal posture: Secondary | ICD-10-CM | POA: Diagnosis not present

## 2023-10-23 DIAGNOSIS — M5412 Radiculopathy, cervical region: Secondary | ICD-10-CM | POA: Diagnosis not present

## 2023-10-25 ENCOUNTER — Ambulatory Visit

## 2023-10-25 DIAGNOSIS — R293 Abnormal posture: Secondary | ICD-10-CM

## 2023-10-25 DIAGNOSIS — M5412 Radiculopathy, cervical region: Secondary | ICD-10-CM | POA: Diagnosis not present

## 2023-10-25 DIAGNOSIS — M6281 Muscle weakness (generalized): Secondary | ICD-10-CM

## 2023-10-25 NOTE — Therapy (Addendum)
 OUTPATIENT PHYSICAL THERAPY CERVICAL TREATMENT   Patient Name: Rhonda Bush MRN: [id number] DOB:10/02/1950, 73 y.o., female Today's Date: 10/25/2023  END OF SESSION:  PT End of Session - 10/25/23 1426     Visit Number 8    Number of Visits 16    Date for PT Re-Evaluation 11/16/23    Authorization Type [employer]    PT Start Time 1345    PT Stop Time 1425    PT Time Calculation (min) 40 min    Activity Tolerance Patient tolerated treatment well    Behavior During Therapy WFL for tasks assessed/performed                 Past Medical History:  Diagnosis Date   Anxiety    Arthritis    rheumatoid arthritis -mild(feet,ankles,hand)-not bothersome now   Asthma    mild- not routine use of meds or inhalers   BURSITIS, LEFT SHOULDER 12/18/2006   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/2009   has improved   DEPRESSION 09/25/2006   GLAUCOMA NOS 09/25/2006   History of hiatal hernia    HYPERTENSION 09/25/2006   MIGRAINE HEADACHE 10/14/2006   OSTEOPOROSIS NOS 10/16/2006   PELVIC REGION, PAIN 12/17/2009   RESTLESS LEG SYNDROME, SEVERE 10/16/2006   not bothered now   SYNDROME, PREMENSTRUAL TENSION 10/14/2006   Past Surgical History:  Procedure Laterality Date   ABDOMINAL HYSTERECTOMY  1992 ,  [place] in [place]   [place]-fibro[place]    BREAST BIOPSY Right    ESOPHAGOGASTRODUODENOSCOPY (EGD) WITH PROPOFOL  N/A 02/13/2013   Procedure: ESOPHAGOGASTRODUODENOSCOPY (EGD) WITH PROPOFOL ;  Surgeon: Elsie Cree, MD;  Location: [facility];  Service: Endoscopy;  Laterality: N/A;   EUS N/A 02/13/2013   Procedure: ESOPHAGEAL ENDOSCOPIC ULTRASOUND (EUS) RADIAL;  Surgeon: Elsie Cree, MD;  Location: [facility];  Service: Endoscopy;  Laterality: N/A;   PARATHYROIDECTOMY N/A 10/18/2018   Procedure: PARATHYROIDECTOMY;  Surgeon: Eletha Boas, MD;  Location: [facility];  Service: General;  Laterality: N/A;   PARATHYROIDECTOMY N/A 10/23/2018   Procedure: NECK EXPLORATION, EVACUATION  OF HEMATOMA;  Surgeon: Eletha Boas, MD;  Location: [facility];  Service: General;  Laterality: N/A;   Patient Active Problem List   Diagnosis Date Noted   Radiculopathy, cervical region 09/01/2023   Heartburn 10/12/2020   Moderate persistent asthma without complication 07/08/2020   Hematoma of neck 10/23/2018   Neck swelling 10/22/2018   Hyperparathyroidism, primary (HCC) 10/13/2018   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/2009   BURSITIS, LEFT SHOULDER 12/18/2006   RESTLESS LEG SYNDROME, SEVERE 10/16/2006   OSTEOPOROSIS NOS 10/16/2006   MIGRAINE HEADACHE 10/14/2006   SYNDROME, PREMENSTRUAL TENSION 10/14/2006   DEPRESSION 09/25/2006   GLAUCOMA NOS 09/25/2006   HYPERTENSION 09/25/2006    PCP: Arnett Repress, MD  REFERRING PROVIDER: Kay CHRISTELLA Cummins, MD  REFERRING DIAG: M54.12 (ICD-10-CM) - Radiculopathy, cervical region  THERAPY DIAG:  Abnormal posture  Radiculopathy, cervical region  Muscle weakness (generalized)  Rationale for Evaluation and Treatment: Rehabilitation  ONSET DATE: 2 months  SUBJECTIVE:  SUBJECTIVE STATEMENT: Pt reports feeling arm tingling more today affecting work duties. Hand dominance: Right  PERTINENT HISTORY:  Rheumatoid arthritis, asthma, HTN, migraines  PAIN:   Are you having pain? Yes: NPRS scale: today 4/10  Pain location: Neck to right hand Pain description: Tingling upper extremity, stiff and sore neck Aggravating factors: Not noted Relieving factors: Not noted  PRECAUTIONS: Cervical  RED FLAGS: None     WEIGHT BEARING RESTRICTIONS: No  FALLS:  Has patient fallen in last 6 months? No  LIVING ENVIRONMENT: Lives with: lives alone Lives in: House/apartment Stairs: Does OK with stairs Has following equipment at home:  None  OCCUPATION: Retired  PLOF: Independent  PATIENT GOALS: Be able to use the right side without baby(ing) it.  NEXT MD VISIT: NA  OBJECTIVE:  Note: Objective measures were completed at Evaluation unless otherwise noted.  DIAGNOSTIC FINDINGS:  Degenerative spondylosis at C4-5, C5-6 and C6-7. No compressive central canal stenosis. Foraminal narrowing that could possibly cause neural compression on either side at C4-5, C5-6 and C6-7.  PATIENT SURVEYS:  PSFS: THE PATIENT SPECIFIC FUNCTIONAL SCALE  Place score of 0-10 (0 = unable to perform activity and 10 = able to perform activity at the same level as before injury or problem)  Activity Date: 09/21/2023    Writing 5/10    2.   Sleeping 7/10    3.   Driving  2/89    4.      Total Score 6.33      Total Score = Sum of activity scores/number of activities  Minimally Detectable Change: 3 points (for single activity); 2 points (for average score)  [facility] (nd). The Patient Specific Functional Scale . Retrieved from [email or web address].[email or web address]   COGNITION: Overall cognitive status: Within functional limits for tasks assessed  SENSATION: Tingling and numbness of the right upper extremity as distal as the hand  POSTURE: rounded shoulders, forward head, and decreased lumbar lordosis     CERVICAL ROM:   Active ROM A/PROM (deg) eval  Flexion   Extension 60  Right lateral flexion 20  Left lateral flexion 15  Right rotation 40  Left rotation 45   (Blank rows = not tested)  UPPER EXTREMITY ROM:  Active ROM Left/Right 09/21/2023   Shoulder flexion    Shoulder extension    Shoulder abduction    Shoulder adduction    Shoulder extension    Shoulder internal rotation    Shoulder external rotation    Elbow flexion    Elbow extension    Wrist flexion    Wrist extension    Wrist ulnar deviation    Wrist radial deviation    Wrist pronation    Wrist  supination     (Blank rows = not tested)  UPPER EXTREMITY STRENGTH:  In pounds assessed with hand-held dynamometer Left/Right 09/21/2023   Shoulder flexion    Shoulder extension    Shoulder abduction    Shoulder adduction    Shoulder extension    Shoulder internal rotation    Shoulder external rotation    Middle trapezius    Lower trapezius    Elbow flexion    Elbow extension    Wrist flexion    Wrist extension    Wrist ulnar deviation    Wrist radial deviation    Wrist pronation    Wrist supination    Grip strength    Cervical Extension 14.8 pounds   Cervical Lateral Bending 5.3/5.9    (Blank rows = not  tested)  TREATMENT DATE:  10/25/23 Seated:  UBE backwards for posture 8 min Cervical extension isometrics with shoulders back 10 x 5 seconds Shoulder shrugs for scapular mobility 2x10 Standing: Shoulder flexion towel slides on wall 15x Bilateral ER with T Band Red 3x10 Shoulder ext with T band green 3x10 Shoulder rows with Tband green 3x10 Scaption with 2# DB 2x10 Supine: Cervical rotation on pillow 10x Cervical retraction supine on pillow 20x 5 sec Manual: Cervical traction     10/23/23 Seated:  UBE backwards for posture 8 min Scapular retraction/shoulder blade pinches 10 x 5 seconds Cervical extension isometrics with shoulders back 10 x 5 seconds Shoulder shrugs for scapular mobility 2x10 Standing: Shoulder flexion towel slides on wall 15x Bilateral ER with T Band Red 3x10 Shoulder ext with T band green 3x10 Shoulder rows with Tband green 3x10 Supine: Cervical rotation on pillow 10x Cervical retraction supine on pillow 20x 5 sec Manual: Cervical traction      10/18/23  Seated:  UBE backwards for posture 8 min Scapular retraction/shoulder blade pinches 10 x 5 seconds Cervical rotation active range of motion with shoulders back 10 x 5 seconds Cervical extension isometrics with shoulders back 10 x 5 seconds Standing: Shoulder flexion towel slides  on wall 15x Bilateral ER with T Band Red 3x10 Shoulder ext with T band green 3x10 Shoulder rows with Tband green 3x10 Supine: Cervical rotation on pillow 10x Cervical retraction supine on pillow 20x 5 sec Manual: Cervical traction    10/16/23  Seated:  UBE backwards for posture 7 min Scapular retraction/shoulder blade pinches 10 x 5 seconds Cervical rotation active range of motion with shoulders back 10 x 5 seconds Cervical extension isometrics with shoulders back 10 x 5 seconds Standing: Shoulder flexion towel slides on wall 15x Bilateral ER with T Band Red 3x10 Shoulder ext with T band green 3x10 Shoulder rows with Tband green 3x10 Supine: Cervical rotation on pillow 10x Cervical retraction supine on pillow 20x 5 sec Manual: Cervical traction     10/11/23  1345-1428 Seated:  UBE backwards for posture 6 min Scapular retraction/shoulder blade pinches 10 x 5 seconds Cervical rotation active range of motion with shoulders back 10 x 5 seconds Cervical extension isometrics with shoulders back 10 x 5 seconds Cervical retractions 2x10 Standing: Shoulder flexion towel slides on wall Bilateral ER with T Band Red 3x10 Shoulder ext with T band green 3x10 Shoulder rows with Tband green 3x10 Supine: Cervical rotation on ball or pillow Cervical retraction supine on pillow 20x Bicep curls with 2# bar 2x10 Manual: Cervical traction                                                                                                                            PATIENT EDUCATION:  Education details: See above Person educated: Patient Education method: Programmer, Multimedia, Demonstration, Tactile cues, Verbal cues, and Handouts Education comprehension: verbalized understanding, returned demonstration, verbal cues required, tactile cues required, and needs further education  HOME EXERCISE PROGRAM: Access Code: [email or web address] URL: [email or web address] Date: 09/21/2023 Prepared by:  Lamar Ivory  Exercises - Standing Scapular Retraction  - 5 x daily - 7 x weekly - 1 sets - 5 reps - 5 second hold - Seated Cervical Rotation AROM  - 3-5 x daily - 7 x weekly - 1 sets - 10 reps - 5 seconds hold - Standing Isometric Cervical Extension with Manual Resistance  - 5 x daily - 7 x weekly - 1 sets - 5 reps - 5 hold  ASSESSMENT:  CLINICAL IMPRESSION: Able to complete treatment with an increase in rest breaks due to some R UE fatigue.      OBJECTIVE IMPAIRMENTS: decreased activity tolerance, decreased endurance, decreased knowledge of condition, decreased ROM, decreased strength, decreased safety awareness, increased fascial restrictions, impaired perceived functional ability, increased muscle spasms, impaired UE functional use, improper body mechanics, postural dysfunction, and pain.   ACTIVITY LIMITATIONS: carrying, lifting, sleeping, and reach over head  PARTICIPATION LIMITATIONS: meal prep, cleaning, and community activity  PERSONAL FACTORS: Rheumatoid arthritis, asthma, HTN, migraines are also affecting patient's functional outcome.   REHAB POTENTIAL: Good  CLINICAL DECISION MAKING: Stable/uncomplicated  EVALUATION COMPLEXITY: Low   GOALS: Goals reviewed with patient? Yes  SHORT TERM GOALS: Target date: 10/19/2023  [name] will be independent with her day 1 home exercise program Baseline: Started 09/21/2023 Goal status: INITIAL  2.  Improve cervical active range of motion for extension to 65; lateral bending to 25 and rotation to at least 50 degrees Baseline: 60; 15/20 and 45/40 respectively Goal status: INITIAL  3.  Improve cervical extension strength to at least 25 pounds Baseline: 14.8 pounds Goal status: INITIAL   LONG TERM GOALS: Target date: 11/16/2023  Improve patient's specific functional score to at least 9.33 Baseline: 6.33 Goal status: INITIAL  2.  [name] will report cervical pain no greater than 3/10 on the numeric pain rating scale with no complaints  of scapular pain and right upper extremity radicular symptoms Baseline: Right upper extremity radicular symptoms as distal as the hand as high as 5/10 Goal status: INITIAL  3.  Improve cervical active range of motion for rotation to at least 60 degrees Baseline: 45/40 degrees Goal status: INITIAL  4.  Improve cervical extension strength to at least 30 pounds and lateral bending to at least 18 pounds Baseline: 14.8 and 5.3/5.9 respectively Goal status: INITIAL  5.  [name] will be independent with her long-term maintenance home exercise program at discharge Baseline: Started 09/21/2023 Goal status: INITIAL   PLAN:  PT FREQUENCY: 1-2x/week  PT DURATION: 8 weeks  PLANNED INTERVENTIONS: 97110-Therapeutic exercises, 97530- Therapeutic activity, 97112- Neuromuscular re-education, 97535- Self Care, 02859- Manual therapy, 97012- Traction (mechanical), 20560 (1-2 muscles), 20561 (3+ muscles)- Dry Needling, Patient/Family education, Spinal mobilization, Cryotherapy, and Moist heat  PLAN FOR NEXT SESSION: Continue with postural exercises.  PHYSICAL THERAPY DISCHARGE SUMMARY  Visits from Start of Care: 8  Current functional level related to goals / functional outcomes: See above   Remaining deficits: See above   Education / Equipment: HEP   Patient agrees to discharge. Patient goals were partially met. Patient is being discharged due to not returning since the last visit.    Burnard Meth, PT 10/25/23  2:27 PM

## 2023-10-27 ENCOUNTER — Ambulatory Visit: Admitting: Orthopaedic Surgery

## 2023-10-27 DIAGNOSIS — M5412 Radiculopathy, cervical region: Secondary | ICD-10-CM

## 2023-10-27 NOTE — Progress Notes (Signed)
 Office Visit Note   Patient: Rhonda Bush           Date of Birth: [birth year]           MRN: [id number] Visit Date: 10/27/2023              Requested by: Sun, Vyvyan, MD [phone] Rhonda [address] [place],  [place] [address] PCP: Rhonda Nutley, MD   Assessment & Plan: Visit Diagnoses:  1. Radiculopathy, cervical region     Plan: History of Present Illness Rhonda Bush is a 73 year old female who presents with right shoulder pain.  She has experienced right shoulder pain for two months, described as a burning sensation in the side of her arm, worsened by arm movements. Physical therapy targeting her arm, shoulder, and neck has not alleviated the pain. In June, she was prescribed steroids and gabapentin ; the steroids were ineffective, and she uses gabapentin  as needed, though she did not take it this morning despite increased pain. She notes tenderness in one spot on her neck but no other significant neck pain.  Physical Exam NECK: Localized neck tenderness. MUSCULOSKELETAL: Right shoulder pain on movement. Right shoulder resisted abduction without pain. Right shoulder external rotation without pain. Right shoulder internal rotation without pain. Right shoulder pain on arm elevation. Positive Hawkins impingement sign on right shoulder.  Assessment and Plan Right shoulder pain.  Could be impingement versus cervical radiculopathy. Chronic right shoulder pain with positive Hawkins impingement sign suggests impingement, possibly due to bone spurs. Differential includes cervical radiculopathy. - Treatment options discussed between subacromial injection versus epidural steroid injection.  She elected to see Dr. Eldonna for consultation of epidural steroid injection.  Follow-Up Instructions: No follow-ups on file.   Orders:  Orders Placed This Encounter  Procedures   Ambulatory referral to Physical Medicine Rehab   No orders of the defined types were placed in this  encounter.   Subjective: Chief Complaint  Patient presents with   Right Shoulder - Pain   Neck - Pain    HPI  Review of Systems  Constitutional: Negative.   HENT: Negative.    Eyes: Negative.   Respiratory: Negative.    Cardiovascular: Negative.   Endocrine: Negative.   Musculoskeletal: Negative.   Neurological: Negative.   Hematological: Negative.   Psychiatric/Behavioral: Negative.    All other systems reviewed and are negative.    Objective: Vital Signs: There were no vitals taken for this visit.  Physical Exam Vitals and nursing note reviewed.  Constitutional:      Appearance: She is well-developed.  HENT:     Head: Atraumatic.     Nose: Nose normal.  Eyes:     Extraocular Movements: Extraocular movements intact.  Cardiovascular:     Pulses: Normal pulses.  Pulmonary:     Effort: Pulmonary effort is normal.  Abdominal:     Palpations: Abdomen is soft.  Musculoskeletal:     Cervical back: Neck supple.  Skin:    General: Skin is warm.     Capillary Refill: Capillary refill takes less than 2 seconds.  Neurological:     Mental Status: She is alert. Mental status is at baseline.  Psychiatric:        Behavior: Behavior normal.        Thought Content: Thought content normal.        Judgment: Judgment normal.     Ortho Exam  Specialty Comments:  CLINICAL DATA:  Numbness and tingling of the hands  and feet over the last 6 months   EXAM: MRI CERVICAL SPINE WITHOUT CONTRAST   TECHNIQUE: Multiplanar, multisequence MR imaging of the cervical spine was performed. No intravenous contrast was administered.   COMPARISON:  None Available.   FINDINGS: Alignment: No significant malalignment.   Vertebrae: No fracture or focal bone lesion.   Cord: No cord compression or focal cord lesion.   Posterior Fossa, vertebral arteries, paraspinal tissues: Negative   Disc levels:   No abnormality from the foramen magnum through C3-4.   C4-5: Endplate  osteophytes and bulging of the disc. Narrowing of the ventral subarachnoid space but no compressive effect upon the cord. Bilateral foraminal narrowing that could affect either C5 nerve.   C5-6: Endplate osteophytes and bulging of the disc. Narrowing of the ventral subarachnoid space but no compression of the cord. Bilateral foraminal narrowing that could affect either C6 nerve.   C6-7: Endplate osteophytes and bulging of the disc. No significant central canal stenosis. Mild foraminal narrowing on the right and moderate foraminal narrowing on the left that could affect the C7 nerves, especially the left.   C7-T1: Normal interspace.   IMPRESSION: Degenerative spondylosis at C4-5, C5-6 and C6-7. No compressive central canal stenosis. Foraminal narrowing that could possibly cause neural compression on either side at C4-5, C5-6 and C6-7.     Electronically Signed   By: Rhonda Bush M.D.   On: 08/22/2023 14:24  Imaging: No results found.   PMFS History: Patient Active Problem List   Diagnosis Date Noted   Radiculopathy, cervical region 09/01/2023   Heartburn 10/12/2020   Moderate persistent asthma without complication 07/08/2020   Hematoma of neck 10/23/2018   Neck swelling 10/22/2018   Hyperparathyroidism, primary (HCC) 10/13/2018   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/2009   BURSITIS, LEFT SHOULDER 12/18/2006   RESTLESS LEG SYNDROME, SEVERE 10/16/2006   OSTEOPOROSIS NOS 10/16/2006   MIGRAINE HEADACHE 10/14/2006   SYNDROME, PREMENSTRUAL TENSION 10/14/2006   DEPRESSION 09/25/2006   GLAUCOMA NOS 09/25/2006   HYPERTENSION 09/25/2006   Past Medical History:  Diagnosis Date   Anxiety    Arthritis    rheumatoid arthritis -mild(feet,ankles,hand)-not bothersome now   Asthma    mild- not routine use of meds or inhalers   BURSITIS, LEFT SHOULDER 12/18/2006   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/2009   has improved   DEPRESSION 09/25/2006   GLAUCOMA NOS  09/25/2006   History of hiatal hernia    HYPERTENSION 09/25/2006   MIGRAINE HEADACHE 10/14/2006   OSTEOPOROSIS NOS 10/16/2006   PELVIC REGION, PAIN 12/17/2009   RESTLESS LEG SYNDROME, SEVERE 10/16/2006   not bothered now   SYNDROME, PREMENSTRUAL TENSION 10/14/2006    Family History  Problem Relation Age of Onset   Heart disease Mother    Diabetes type II Mother    Kidney failure Mother    Heart attack Father    High blood pressure Father    Healthy Sister    Healthy Sister    Colon cancer Brother    Arthritis Brother    Vision loss Brother    High blood pressure Brother    Healthy Son    Asthma Neg Hx     Past Surgical History:  Procedure Laterality Date   ABDOMINAL HYSTERECTOMY  1992 ,  [place] total in 1994   [place] b[place]-fibro[place]    BREAST BIOPSY Right    ESOPHAGOGASTRODUODENOSCOPY (EGD) WITH PROPOFOL  N/A 02/13/2013   Procedure: ESOPHAGOGASTRODUODENOSCOPY (EGD) WITH PROPOFOL ;  Surgeon: Elsie Cree, MD;  Location: [facility];  Service: Endoscopy;  Laterality: N/A;   EUS N/A 02/13/2013   Procedure: ESOPHAGEAL ENDOSCOPIC ULTRASOUND (EUS) RADIAL;  Surgeon: Elsie Cree, MD;  Location: [facility];  Service: Endoscopy;  Laterality: N/A;   PARATHYROIDECTOMY N/A 10/18/2018   Procedure: PARATHYROIDECTOMY;  Surgeon: Eletha Boas, MD;  Location: [facility];  Service: General;  Laterality: N/A;   PARATHYROIDECTOMY N/A 10/23/2018   Procedure: NECK EXPLORATION, EVACUATION OF HEMATOMA;  Surgeon: Eletha Boas, MD;  Location: [facility];  Service: General;  Laterality: N/A;   Social History   Occupational History   Occupation: retired - Set designer  Tobacco Use   Smoking status: Never   Smokeless tobacco: Never  Vaping Use   Vaping status: Never Used  Substance and Sexual Activity   Alcohol use: Yes    Comment: very rare, once in a 'blue moon'   Drug use: No   Sexual activity: Not Currently

## 2023-11-03 DIAGNOSIS — D1720 Benign lipomatous neoplasm of skin and subcutaneous tissue of unspecified limb: Secondary | ICD-10-CM | POA: Diagnosis not present

## 2023-11-03 DIAGNOSIS — R600 Localized edema: Secondary | ICD-10-CM | POA: Diagnosis not present

## 2023-11-03 DIAGNOSIS — I10 Essential (primary) hypertension: Secondary | ICD-10-CM | POA: Diagnosis not present

## 2023-11-03 NOTE — Therapy (Incomplete)
 OUTPATIENT PHYSICAL THERAPY CERVICAL TREATMENT   Patient Name: Rhonda Bush MRN: [id number] DOB:05/10/1950, 73 y.o., female Today's Date: 11/03/2023  END OF SESSION:***           Past Medical History:  Diagnosis Date   Anxiety    Arthritis    rheumatoid arthritis -mild(feet,ankles,hand)-not bothersome now   Asthma    mild- not routine use of meds or inhalers   BURSITIS, LEFT SHOULDER 12/18/2006   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/2009   has improved   DEPRESSION 09/25/2006   GLAUCOMA NOS 09/25/2006   History of hiatal hernia    HYPERTENSION 09/25/2006   MIGRAINE HEADACHE 10/14/2006   OSTEOPOROSIS NOS 10/16/2006   PELVIC REGION, PAIN 12/17/2009   RESTLESS LEG SYNDROME, SEVERE 10/16/2006   not bothered now   SYNDROME, PREMENSTRUAL TENSION 10/14/2006   Past Surgical History:  Procedure Laterality Date   ABDOMINAL HYSTERECTOMY  1992 ,  [place] total in 1994   [place] b[place]-fibro[place]    BREAST BIOPSY Right    ESOPHAGOGASTRODUODENOSCOPY (EGD) WITH PROPOFOL  N/A 02/13/2013   Procedure: ESOPHAGOGASTRODUODENOSCOPY (EGD) WITH PROPOFOL ;  Surgeon: Elsie Cree, MD;  Location: [facility];  Service: Endoscopy;  Laterality: N/A;   EUS N/A 02/13/2013   Procedure: ESOPHAGEAL ENDOSCOPIC ULTRASOUND (EUS) RADIAL;  Surgeon: Elsie Cree, MD;  Location: [facility];  Service: Endoscopy;  Laterality: N/A;   PARATHYROIDECTOMY N/A 10/18/2018   Procedure: PARATHYROIDECTOMY;  Surgeon: Eletha Boas, MD;  Location: [facility];  Service: General;  Laterality: N/A;   PARATHYROIDECTOMY N/A 10/23/2018   Procedure: NECK EXPLORATION, EVACUATION OF HEMATOMA;  Surgeon: Eletha Boas, MD;  Location: [facility];  Service: General;  Laterality: N/A;   Patient Active Problem List   Diagnosis Date Noted   Radiculopathy, cervical region 09/01/2023   Heartburn 10/12/2020   Moderate persistent asthma without complication 07/08/2020   Hematoma of neck 10/23/2018   Neck swelling 10/22/2018    Hyperparathyroidism, primary (HCC) 10/13/2018   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/2009   BURSITIS, LEFT SHOULDER 12/18/2006   RESTLESS LEG SYNDROME, SEVERE 10/16/2006   OSTEOPOROSIS NOS 10/16/2006   MIGRAINE HEADACHE 10/14/2006   SYNDROME, PREMENSTRUAL TENSION 10/14/2006   DEPRESSION 09/25/2006   GLAUCOMA NOS 09/25/2006   HYPERTENSION 09/25/2006    PCP: Arnett Repress, MD  REFERRING PROVIDER: Kay CHRISTELLA Cummins, MD  REFERRING DIAG: M54.12 (ICD-10-CM) - Radiculopathy, cervical region  THERAPY DIAG:  No diagnosis found.  Rationale for Evaluation and Treatment: Rehabilitation  ONSET DATE: 2 months  SUBJECTIVE:  SUBJECTIVE STATEMENT: ***Pt reports feeling arm tingling more today affecting work duties. Hand dominance: Right  PERTINENT HISTORY:  Rheumatoid arthritis, asthma, HTN, migraines  PAIN:   ***Are you having pain? Yes: NPRS scale: today 4/10  Pain location: Neck to right hand Pain description: Tingling upper extremity, stiff and sore neck Aggravating factors: Not noted Relieving factors: Not noted  PRECAUTIONS: Cervical  RED FLAGS: None     WEIGHT BEARING RESTRICTIONS: No  FALLS:  Has patient fallen in last 6 months? No  LIVING ENVIRONMENT: Lives with: lives alone Lives in: House/apartment Stairs: Does OK with stairs Has following equipment at home: None  OCCUPATION: Retired  PLOF: Independent  PATIENT GOALS: Be able to use the right side without baby(ing) it.  NEXT MD VISIT: NA  OBJECTIVE:  Note: Objective measures were completed at Evaluation unless otherwise noted.  DIAGNOSTIC FINDINGS:  Degenerative spondylosis at C4-5, C5-6 and C6-7. No compressive central canal stenosis. Foraminal narrowing that could possibly cause neural compression on  either side at C4-5, C5-6 and C6-7.  PATIENT SURVEYS:  PSFS: THE PATIENT SPECIFIC FUNCTIONAL SCALE  Place score of 0-10 (0 = unable to perform activity and 10 = able to perform activity at the same level as before injury or problem)  Activity Date: 09/21/2023    Writing 5/10    2.   Sleeping 7/10    3.   Driving  2/89    4.      Total Score 6.33      Total Score = Sum of activity scores/number of activities  Minimally Detectable Change: 3 points (for single activity); 2 points (for average score)  [facility] (nd). The Patient Specific Functional Scale . Retrieved from [email or web address]   COGNITION: Overall cognitive status: Within functional limits for tasks assessed  SENSATION: Tingling and numbness of the right upper extremity as distal as the hand  POSTURE: rounded shoulders, forward head, and decreased lumbar lordosis     CERVICAL ROM:   Active ROM A/PROM (deg) eval  Flexion   Extension 60  Right lateral flexion 20  Left lateral flexion 15  Right rotation 40  Left rotation 45   (Blank rows = not tested)  UPPER EXTREMITY ROM:  Active ROM Left/Right 09/21/2023   Shoulder flexion    Shoulder extension    Shoulder abduction    Shoulder adduction    Shoulder extension    Shoulder internal rotation    Shoulder external rotation    Elbow flexion    Elbow extension    Wrist flexion    Wrist extension    Wrist ulnar deviation    Wrist radial deviation    Wrist pronation    Wrist supination     (Blank rows = not tested)  UPPER EXTREMITY STRENGTH:  In pounds assessed with hand-held dynamometer Left/Right 09/21/2023   Shoulder flexion    Shoulder extension    Shoulder abduction    Shoulder adduction    Shoulder extension    Shoulder internal rotation    Shoulder external rotation    Middle trapezius    Lower trapezius    Elbow flexion    Elbow extension    Wrist flexion    Wrist  extension    Wrist ulnar deviation    Wrist radial deviation    Wrist pronation    Wrist supination    Grip strength    Cervical Extension 14.8 pounds   Cervical Lateral Bending 5.3/5.9    (Blank rows = not  tested)  TREATMENT DATE:  11/06/23***     10/25/23 Seated:  UBE backwards for posture 8 min Cervical extension isometrics with shoulders back 10 x 5 seconds Shoulder shrugs for scapular mobility 2x10 Standing: Shoulder flexion towel slides on wall 15x Bilateral ER with T Band Red 3x10 Shoulder ext with T band green 3x10 Shoulder rows with Tband green 3x10 Scaption with 2# DB 2x10 Supine: Cervical rotation on pillow 10x Cervical retraction supine on pillow 20x 5 sec Manual: Cervical traction     10/23/23 Seated:  UBE backwards for posture 8 min Scapular retraction/shoulder blade pinches 10 x 5 seconds Cervical extension isometrics with shoulders back 10 x 5 seconds Shoulder shrugs for scapular mobility 2x10 Standing: Shoulder flexion towel slides on wall 15x Bilateral ER with T Band Red 3x10 Shoulder ext with T band green 3x10 Shoulder rows with Tband green 3x10 Supine: Cervical rotation on pillow 10x Cervical retraction supine on pillow 20x 5 sec Manual: Cervical traction      10/18/23  Seated:  UBE backwards for posture 8 min Scapular retraction/shoulder blade pinches 10 x 5 seconds Cervical rotation active range of motion with shoulders back 10 x 5 seconds Cervical extension isometrics with shoulders back 10 x 5 seconds Standing: Shoulder flexion towel slides on wall 15x Bilateral ER with T Band Red 3x10 Shoulder ext with T band green 3x10 Shoulder rows with Tband green 3x10 Supine: Cervical rotation on pillow 10x Cervical retraction supine on pillow 20x 5 sec Manual: Cervical traction    10/16/23  Seated:  UBE backwards for posture 7 min Scapular retraction/shoulder blade pinches 10 x 5 seconds Cervical rotation active range of motion  with shoulders back 10 x 5 seconds Cervical extension isometrics with shoulders back 10 x 5 seconds Standing: Shoulder flexion towel slides on wall 15x Bilateral ER with T Band Red 3x10 Shoulder ext with T band green 3x10 Shoulder rows with Tband green 3x10 Supine: Cervical rotation on pillow 10x Cervical retraction supine on pillow 20x 5 sec Manual: Cervical traction     10/11/23  [id number] Seated:  UBE backwards for posture 6 min Scapular retraction/shoulder blade pinches 10 x 5 seconds Cervical rotation active range of motion with shoulders back 10 x 5 seconds Cervical extension isometrics with shoulders back 10 x 5 seconds Cervical retractions 2x10 Standing: Shoulder flexion towel slides on wall Bilateral ER with T Band Red 3x10 Shoulder ext with T band green 3x10 Shoulder rows with Tband green 3x10 Supine: Cervical rotation on ball or pillow Cervical retraction supine on pillow 20x Bicep curls with 2# bar 2x10 Manual: Cervical traction                                                                                                                            PATIENT EDUCATION:  Education details: See above Person educated: Patient Education method: Explanation, Demonstration, Tactile cues, Verbal cues, and Handouts Education comprehension: verbalized understanding, returned demonstration, verbal cues required, tactile cues  required, and needs further education  HOME EXERCISE PROGRAM: Access Code: [email or web address] URL: [email or web address] Date: 09/21/2023 Prepared by: Lamar Ivory  Exercises - Standing Scapular Retraction  - 5 x daily - 7 x weekly - 1 sets - 5 reps - 5 second hold - Seated Cervical Rotation AROM  - 3-5 x daily - 7 x weekly - 1 sets - 10 reps - 5 seconds hold - Standing Isometric Cervical Extension with Manual Resistance  - 5 x daily - 7 x weekly - 1 sets - 5 reps - 5 hold  ASSESSMENT:  CLINICAL IMPRESSION: ***Able to complete  treatment with an increase in rest breaks due to some R UE fatigue.      OBJECTIVE IMPAIRMENTS: decreased activity tolerance, decreased endurance, decreased knowledge of condition, decreased ROM, decreased strength, decreased safety awareness, increased fascial restrictions, impaired perceived functional ability, increased muscle spasms, impaired UE functional use, improper body mechanics, postural dysfunction, and pain.   ACTIVITY LIMITATIONS: carrying, lifting, sleeping, and reach over head  PARTICIPATION LIMITATIONS: meal prep, cleaning, and community activity  PERSONAL FACTORS: Rheumatoid arthritis, asthma, HTN, migraines are also affecting patient's functional outcome.   REHAB POTENTIAL: Good  CLINICAL DECISION MAKING: Stable/uncomplicated  EVALUATION COMPLEXITY: Low   GOALS: Goals reviewed with patient? Yes  SHORT TERM GOALS: Target date: 10/19/2023  [name] will be independent with her day 1 home exercise program Baseline: Started 09/21/2023 Goal status: INITIAL  2.  Improve cervical active range of motion for extension to 65; lateral bending to 25 and rotation to at least 50 degrees Baseline: 60; 15/20 and 45/40 respectively Goal status: INITIAL  3.  Improve cervical extension strength to at least 25 pounds Baseline: 14.8 pounds Goal status: INITIAL   LONG TERM GOALS: Target date: 11/16/2023  Improve patient's specific functional score to at least 9.33 Baseline: 6.33 Goal status: INITIAL  2.  [name] will report cervical pain no greater than 3/10 on the numeric pain rating scale with no complaints of scapular pain and right upper extremity radicular symptoms Baseline: Right upper extremity radicular symptoms as distal as the hand as high as 5/10 Goal status: INITIAL  3.  Improve cervical active range of motion for rotation to at least 60 degrees Baseline: 45/40 degrees Goal status: INITIAL  4.  Improve cervical extension strength to at least 30 pounds and lateral bending  to at least 18 pounds Baseline: 14.8 and 5.3/5.9 respectively Goal status: INITIAL  5.  [name] will be independent with her long-term maintenance home exercise program at discharge Baseline: Started 09/21/2023 Goal status: INITIAL   PLAN:  PT FREQUENCY: 1-2x/week  PT DURATION: 8 weeks  PLANNED INTERVENTIONS: 97110-Therapeutic exercises, 97530- Therapeutic activity, 97112- Neuromuscular re-education, 97535- Self Care, 02859- Manual therapy, 97012- Traction (mechanical), 20560 (1-2 muscles), 20561 (3+ muscles)- Dry Needling, Patient/Family education, Spinal mobilization, Cryotherapy, and Moist heat  PLAN FOR NEXT SESSION: ***Continue with postural exercises.  Burnard Meth, PT 11/03/23  12:39 PM

## 2023-11-06 ENCOUNTER — Ambulatory Visit: Admitting: Professional Counselor

## 2023-11-06 ENCOUNTER — Encounter

## 2023-11-08 DIAGNOSIS — I10 Essential (primary) hypertension: Secondary | ICD-10-CM | POA: Diagnosis not present

## 2023-11-09 ENCOUNTER — Encounter

## 2023-11-12 DIAGNOSIS — M0579 Rheumatoid arthritis with rheumatoid factor of multiple sites without organ or systems involvement: Secondary | ICD-10-CM | POA: Diagnosis not present

## 2023-11-12 DIAGNOSIS — I10 Essential (primary) hypertension: Secondary | ICD-10-CM | POA: Diagnosis not present

## 2023-11-12 DIAGNOSIS — J4520 Mild intermittent asthma, uncomplicated: Secondary | ICD-10-CM | POA: Diagnosis not present

## 2023-11-12 DIAGNOSIS — F3342 Major depressive disorder, recurrent, in full remission: Secondary | ICD-10-CM | POA: Diagnosis not present

## 2023-11-12 DIAGNOSIS — M810 Age-related osteoporosis without current pathological fracture: Secondary | ICD-10-CM | POA: Diagnosis not present

## 2023-11-16 ENCOUNTER — Encounter: Admitting: Rehabilitative and Restorative Service Providers"

## 2023-11-17 DIAGNOSIS — M0579 Rheumatoid arthritis with rheumatoid factor of multiple sites without organ or systems involvement: Secondary | ICD-10-CM | POA: Diagnosis not present

## 2023-11-17 DIAGNOSIS — D72819 Decreased white blood cell count, unspecified: Secondary | ICD-10-CM | POA: Diagnosis not present

## 2023-11-17 DIAGNOSIS — M810 Age-related osteoporosis without current pathological fracture: Secondary | ICD-10-CM | POA: Diagnosis not present

## 2023-11-17 DIAGNOSIS — Z79899 Other long term (current) drug therapy: Secondary | ICD-10-CM | POA: Diagnosis not present

## 2023-11-22 DIAGNOSIS — R053 Chronic cough: Secondary | ICD-10-CM | POA: Diagnosis not present

## 2023-11-22 DIAGNOSIS — K219 Gastro-esophageal reflux disease without esophagitis: Secondary | ICD-10-CM | POA: Diagnosis not present

## 2023-11-22 DIAGNOSIS — J4520 Mild intermittent asthma, uncomplicated: Secondary | ICD-10-CM | POA: Diagnosis not present

## 2023-11-23 ENCOUNTER — Other Ambulatory Visit: Payer: Self-pay | Admitting: Physician Assistant

## 2023-11-27 ENCOUNTER — Other Ambulatory Visit: Payer: Self-pay

## 2023-11-27 ENCOUNTER — Ambulatory Visit: Admitting: Physical Medicine and Rehabilitation

## 2023-11-27 VITALS — BP 133/80 | HR 76

## 2023-11-27 DIAGNOSIS — M5412 Radiculopathy, cervical region: Secondary | ICD-10-CM | POA: Diagnosis not present

## 2023-11-27 MED ORDER — METHYLPREDNISOLONE ACETATE 40 MG/ML IJ SUSP
40.0000 mg | Freq: Once | INTRAMUSCULAR | Status: AC
  Administered 2023-11-27: 40 mg

## 2023-11-27 NOTE — Progress Notes (Unsigned)
 Pain Scale   Average Pain 4 Patient advised she has neck pain radiating to right shoulder causing numbness and tingling.        +Driver, -BT, -Dye Allergies.

## 2023-11-28 NOTE — Procedures (Signed)
 Cervical Epidural Steroid Injection - Interlaminar Approach with Fluoroscopic Guidance  Patient: Rhonda Bush      Date of Birth: [birth year] MRN: [id number] PCP: Sun, Vyvyan, MD      Visit Date: 11/27/2023   Universal Protocol:    Date/Time: 09/16/255:33 PM  Consent Given By: the patient  Position: PRONE  Additional Comments: Vital signs were monitored before and after the procedure. Patient was prepped and draped in the usual sterile fashion. The correct patient, procedure, and site was verified.   Injection Procedure Details:   Procedure diagnoses: Cervical radiculopathy [M54.12]    Meds Administered:  Meds ordered this encounter  Medications   methylPREDNISolone  acetate (DEPO-MEDROL ) injection 40 mg     Laterality: Right  Location/Site: C7-T1  Needle: 3.5 in., 20 ga. Tuohy  Needle Placement: Paramedian epidural space  Findings:  -Comments: Excellent flow of contrast into the epidural space.  Procedure Details: Using a paramedian approach from the side mentioned above, the region overlying the inferior lamina was localized under fluoroscopic visualization and the soft tissues overlying this structure were infiltrated with 4 ml. of 1% Lidocaine  without Epinephrine. A # 20 gauge, Tuohy needle was inserted into the epidural space using a paramedian approach.  The epidural space was localized using loss of resistance along with contralateral oblique bi-planar fluoroscopic views.  After negative aspirate for air, blood, and CSF, a 2 ml. volume of Isovue-250 was injected into the epidural space and the flow of contrast was observed. Radiographs were obtained for documentation purposes.   The injectate was administered into the level noted above.  Additional Comments:  The patient tolerated the procedure well Dressing: 2 x 2 sterile gauze and Band-Aid    Post-procedure details: Patient was observed during the procedure. Post-procedure instructions were  reviewed.  Patient left the clinic in stable condition.

## 2023-11-28 NOTE — Progress Notes (Signed)
 Rhonda Bush - 73 y.o. female MRN [id number]  Date of birth: 06/07/1950  Office Visit Note: Visit Date: 11/27/2023 PCP: Sun, Vyvyan, MD Referred by: Sun, Vyvyan, MD  Subjective: Chief Complaint  Patient presents with   Neck - Pain   HPI:  Rhonda Bush is a 73 y.o. female who comes in today at the request of Dr. Ozell Cummins for planned Right C7-T1 Cervical Interlaminar epidural steroid injection with fluoroscopic guidance.  The patient has failed conservative care including home exercise, medications, time and activity modification.  This injection will be diagnostic and hopefully therapeutic.  Please see requesting physician notes for further details and justification.   ROS Otherwise per HPI.  Assessment & Plan: Visit Diagnoses:    ICD-10-CM   1. Cervical radiculopathy  M54.12 XR C-ARM NO REPORT    Epidural Steroid injection    methylPREDNISolone  acetate (DEPO-MEDROL ) injection 40 mg      Plan: No additional findings.   Meds & Orders:  Meds ordered this encounter  Medications   methylPREDNISolone  acetate (DEPO-MEDROL ) injection 40 mg    Orders Placed This Encounter  Procedures   XR C-ARM NO REPORT   Epidural Steroid injection    Follow-up: Return for visit to requesting provider as needed.   Procedures: No procedures performed  Cervical Epidural Steroid Injection - Interlaminar Approach with Fluoroscopic Guidance  Patient: Rhonda Bush      Date of Birth: Mar 29, 1950 MRN: [id number] PCP: Sun, Vyvyan, MD      Visit Date: 11/27/2023   Universal Protocol:    Date/Time: 09/16/255:33 PM  Consent Given By: the patient  Position: PRONE  Additional Comments: Vital signs were monitored before and after the procedure. Patient was prepped and draped in the usual sterile fashion. The correct patient, procedure, and site was verified.   Injection Procedure Details:   Procedure diagnoses: Cervical radiculopathy [M54.12]    Meds Administered:  Meds  ordered this encounter  Medications   methylPREDNISolone  acetate (DEPO-MEDROL ) injection 40 mg     Laterality: Right  Location/Site: C7-T1  Needle: 3.5 in., 20 ga. Tuohy  Needle Placement: Paramedian epidural space  Findings:  -Comments: Excellent flow of contrast into the epidural space.  Procedure Details: Using a paramedian approach from the side mentioned above, the region overlying the inferior lamina was localized under fluoroscopic visualization and the soft tissues overlying this structure were infiltrated with 4 ml. of 1% Lidocaine  without Epinephrine. A # 20 gauge, Tuohy needle was inserted into the epidural space using a paramedian approach.  The epidural space was localized using loss of resistance along with contralateral oblique bi-planar fluoroscopic views.  After negative aspirate for air, blood, and CSF, a 2 ml. volume of Isovue-250 was injected into the epidural space and the flow of contrast was observed. Radiographs were obtained for documentation purposes.   The injectate was administered into the level noted above.  Additional Comments:  The patient tolerated the procedure well Dressing: 2 x 2 sterile gauze and Band-Aid    Post-procedure details: Patient was observed during the procedure. Post-procedure instructions were reviewed.  Patient left the clinic in stable condition.   Clinical History: CLINICAL DATA:  Numbness and tingling of the hands and feet over the last 6 months   EXAM: MRI CERVICAL SPINE WITHOUT CONTRAST   TECHNIQUE: Multiplanar, multisequence MR imaging of the cervical spine was performed. No intravenous contrast was administered.   COMPARISON:  None Available.   FINDINGS: Alignment: No significant malalignment.   Vertebrae: No  fracture or focal bone lesion.   Cord: No cord compression or focal cord lesion.   Posterior Fossa, vertebral arteries, paraspinal tissues: Negative   Disc levels:   No abnormality from the  foramen magnum through C3-4.   C4-5: Endplate osteophytes and bulging of the disc. Narrowing of the ventral subarachnoid space but no compressive effect upon the cord. Bilateral foraminal narrowing that could affect either C5 nerve.   C5-6: Endplate osteophytes and bulging of the disc. Narrowing of the ventral subarachnoid space but no compression of the cord. Bilateral foraminal narrowing that could affect either C6 nerve.   C6-7: Endplate osteophytes and bulging of the disc. No significant central canal stenosis. Mild foraminal narrowing on the right and moderate foraminal narrowing on the left that could affect the C7 nerves, especially the left.   C7-T1: Normal interspace.   IMPRESSION: Degenerative spondylosis at C4-5, C5-6 and C6-7. No compressive central canal stenosis. Foraminal narrowing that could possibly cause neural compression on either side at C4-5, C5-6 and C6-7.     Electronically Signed   By: Oneil Officer M.D.   On: 08/22/2023 14:24     Objective:  VS:  HT:    WT:   BMI:     BP:133/80  HR:76bpm  TEMP: ( )  RESP:  Physical Exam Vitals and nursing note reviewed.  Constitutional:      General: She is not in acute distress.    Appearance: Normal appearance. She is not ill-appearing.  HENT:     Head: Normocephalic and atraumatic.     Right Ear: External ear normal.     Left Ear: External ear normal.  Eyes:     Extraocular Movements: Extraocular movements intact.  Cardiovascular:     Rate and Rhythm: Normal rate.     Pulses: Normal pulses.  Musculoskeletal:     Cervical back: Tenderness present. No rigidity.     Right lower leg: No edema.     Left lower leg: No edema.     Comments: Patient has good strength in the upper extremities including 5 out of 5 strength in wrist extension long finger flexion and APB.  There is no atrophy of the hands intrinsically.  There is a negative Hoffmann's test.   Lymphadenopathy:     Cervical: No cervical  adenopathy.  Skin:    Findings: No erythema, lesion or rash.  Neurological:     General: No focal deficit present.     Mental Status: She is alert and oriented to person, place, and time.     Sensory: No sensory deficit.     Motor: No weakness or abnormal muscle tone.     Coordination: Coordination normal.  Psychiatric:        Mood and Affect: Mood normal.        Behavior: Behavior normal.      Imaging: No results found.

## 2023-12-08 DIAGNOSIS — I10 Essential (primary) hypertension: Secondary | ICD-10-CM | POA: Diagnosis not present

## 2023-12-12 DIAGNOSIS — J4520 Mild intermittent asthma, uncomplicated: Secondary | ICD-10-CM | POA: Diagnosis not present

## 2023-12-12 DIAGNOSIS — M0579 Rheumatoid arthritis with rheumatoid factor of multiple sites without organ or systems involvement: Secondary | ICD-10-CM | POA: Diagnosis not present

## 2023-12-12 DIAGNOSIS — M810 Age-related osteoporosis without current pathological fracture: Secondary | ICD-10-CM | POA: Diagnosis not present

## 2023-12-12 DIAGNOSIS — F3342 Major depressive disorder, recurrent, in full remission: Secondary | ICD-10-CM | POA: Diagnosis not present

## 2023-12-26 ENCOUNTER — Other Ambulatory Visit: Payer: Self-pay | Admitting: Family Medicine

## 2023-12-26 DIAGNOSIS — Z1231 Encounter for screening mammogram for malignant neoplasm of breast: Secondary | ICD-10-CM

## 2023-12-29 DIAGNOSIS — I10 Essential (primary) hypertension: Secondary | ICD-10-CM | POA: Diagnosis not present

## 2023-12-29 DIAGNOSIS — R400 Somnolence: Secondary | ICD-10-CM | POA: Diagnosis not present

## 2023-12-29 DIAGNOSIS — R600 Localized edema: Secondary | ICD-10-CM | POA: Diagnosis not present

## 2023-12-29 DIAGNOSIS — R7301 Impaired fasting glucose: Secondary | ICD-10-CM | POA: Diagnosis not present

## 2023-12-29 DIAGNOSIS — F330 Major depressive disorder, recurrent, mild: Secondary | ICD-10-CM | POA: Diagnosis not present

## 2024-01-07 DIAGNOSIS — I10 Essential (primary) hypertension: Secondary | ICD-10-CM | POA: Diagnosis not present

## 2024-01-08 DIAGNOSIS — H402231 Chronic angle-closure glaucoma, bilateral, mild stage: Secondary | ICD-10-CM | POA: Diagnosis not present

## 2024-01-12 ENCOUNTER — Ambulatory Visit (INDEPENDENT_AMBULATORY_CARE_PROVIDER_SITE_OTHER): Payer: Self-pay | Admitting: Physician Assistant

## 2024-01-12 ENCOUNTER — Encounter: Payer: Self-pay | Admitting: Physician Assistant

## 2024-01-12 DIAGNOSIS — I10 Essential (primary) hypertension: Secondary | ICD-10-CM | POA: Diagnosis not present

## 2024-01-12 DIAGNOSIS — F411 Generalized anxiety disorder: Secondary | ICD-10-CM

## 2024-01-12 DIAGNOSIS — M0579 Rheumatoid arthritis with rheumatoid factor of multiple sites without organ or systems involvement: Secondary | ICD-10-CM | POA: Diagnosis not present

## 2024-01-12 DIAGNOSIS — J4520 Mild intermittent asthma, uncomplicated: Secondary | ICD-10-CM | POA: Diagnosis not present

## 2024-01-12 DIAGNOSIS — F331 Major depressive disorder, recurrent, moderate: Secondary | ICD-10-CM | POA: Diagnosis not present

## 2024-01-12 DIAGNOSIS — F3342 Major depressive disorder, recurrent, in full remission: Secondary | ICD-10-CM | POA: Diagnosis not present

## 2024-01-12 DIAGNOSIS — M810 Age-related osteoporosis without current pathological fracture: Secondary | ICD-10-CM | POA: Diagnosis not present

## 2024-01-12 MED ORDER — BUPROPION HCL ER (XL) 300 MG PO TB24: 300.0000 mg | ORAL_TABLET | Freq: Every day | ORAL | 1 refills | Status: AC

## 2024-01-12 NOTE — Progress Notes (Signed)
 [facility]  Patient ID: Rhonda Bush,  MRN: [id number]  PCP: Sun, Vyvyan, MD  Date of Evaluation: 01/12/2024 Time spent:20 minutes  Chief Complaint:  Chief Complaint   Anxiety; Follow-up; Depression    HISTORY/CURRENT STATUS: HPI For routine med check.  Has been a little down, worse in the evenings, comes out of nowhere, she keeps pushing though.  Having her grandson, living with her right now is helpful.  She really enjoys him.  Energy and motivation are tired most of the time.  Sleeps well most of the time. ADLs and personal hygiene are normal.   Denies any changes in concentration, making decisions, or remembering things.  Appetite has not changed.  Weight is stable.  Rarely has anxiety.  Has the Xanax  on hand but never takes it.  No mania, delirium, AH/VH.  No SI/HI.   Individual Medical History/ Review of Systems: Changes? :Yes   has a cold right now.  Past medications for mental health diagnoses include: Prozac , Xanax  for 1 month back in the 80s.   Allergies: Egg protein-containing drug products, Influenza vaccines, and Other  Current Medications:  Current Outpatient Medications:    albuterol  (VENTOLIN  HFA) 108 (90 Base) MCG/ACT inhaler, Inhale 2 puffs into the lungs every 4 (four) hours as needed for wheezing or shortness of breath (coughing fits)., Disp: 18 g, Rfl: 3   alendronate (FOSAMAX) 70 MG tablet, Take 70 mg by mouth once a week., Disp: , Rfl:    ALPRAZolam  (XANAX ) 0.25 MG tablet, Take 1-2 tablets (0.25-0.5 mg total) by mouth 2 (two) times daily as needed for anxiety., Disp: 60 tablet, Rfl: 0   Alum Hydroxide-Mag Trisilicate (GAVISCON) 80-14.2 MG CHEW, 2 tablets after meals and at bedtime as needed, Disp: , Rfl:    amLODipine  (NORVASC ) 5 MG tablet, Take 5 mg by mouth daily after breakfast. , Disp: , Rfl:    buPROPion  (WELLBUTRIN  XL) 300 MG 24 hr tablet, Take 1 tablet (300 mg total) by mouth daily., Disp: 90 tablet, Rfl: 1   cetirizine  (ZYRTEC ) 10  MG tablet, Take 1 tablet (10 mg total) by mouth daily., Disp: 30 tablet, Rfl: 5   cholecalciferol (VITAMIN D3) 25 MCG (1000 UT) tablet, Take 1,000 Units by mouth daily after breakfast. , Disp: , Rfl:    cyanocobalamin 1000 MCG tablet, Take by mouth., Disp: , Rfl:    dorzolamide  (TRUSOPT ) 2 % ophthalmic solution, Place 1 drop into both eyes 2 (two) times daily., Disp: , Rfl:    fluticasone -salmeterol (ADVAIR ) 250-50 MCG/ACT AEPB, Inhale 1 puff into the lungs every 12 (twelve) hours., Disp: 60 each, Rfl: 11   furosemide (LASIX) 20 MG tablet, Take 20 mg by mouth daily. As needed, Disp: , Rfl:    gabapentin  (NEURONTIN ) 100 MG capsule, Take 1 capsule (100 mg total) by mouth 3 (three) times daily., Disp: 30 capsule, Rfl: 3   hydrochlorothiazide (HYDRODIURIL) 25 MG tablet, , Disp: , Rfl:    hydroxychloroquine (PLAQUENIL) 200 MG tablet, See admin instructions., Disp: , Rfl:    latanoprost  (XALATAN ) 0.005 % ophthalmic solution, Place 1 drop into both eyes at bedtime., Disp: , Rfl:    metoprolol  succinate (TOPROL -XL) 100 MG 24 hr tablet, Take 100 mg by mouth daily after breakfast. Take with or immediately following a meal., Disp: , Rfl:    Oyster Shell Calcium 500 MG TABS, 1 tablet with meals, Disp: , Rfl:    telmisartan (MICARDIS) 80 MG tablet, Take 80 mg by mouth daily after breakfast., Disp: ,  Rfl:    Vitamin D-Vitamin K (VITAMIN K2-VITAMIN D3) 45-2000 MCG-UNIT CAPS, 1 tablet, Disp: , Rfl:    ibandronate (BONIVA) 150 MG tablet, Take 150 mg by mouth every 30 (thirty) days. (Patient not taking: Reported on 02/28/2023), Disp: , Rfl:  Medication Side Effects: none  Family Medical/ Social History: Changes? Her 75 yo grandson (son's son) is living with her now.  Very happy with him.  MENTAL HEALTH EXAM:  There were no vitals taken for this visit.There is no height or weight on file to calculate BMI.  General Appearance: Casual and Well Groomed  Eye Contact:  Good  Speech:  Clear and Coherent and Normal  Rate  Volume:  Normal  Mood:  sad  Affect:  Congruent  Thought Process:  Goal Directed and Descriptions of Associations: Circumstantial  Orientation:  Full (Time, Place, and Person)  Thought Content: Logical   Suicidal Thoughts:  No  Homicidal Thoughts:  No  Memory:  WNL  Judgement:  Good  Insight:  Good  Psychomotor Activity:  Normal  Concentration:  Concentration: Good  Recall:  Good  Fund of Knowledge: Good  Language: Good  Assets:  Communication Skills Desire for Improvement Financial Resources/Insurance Housing Leisure Time Resilience Social Support Transportation  ADL's:  Intact  Cognition: WNL  Prognosis:  Good   DIAGNOSES:    ICD-10-CM   1. Major depressive disorder, recurrent episode, moderate (HCC)  F33.1     2. Generalized anxiety disorder  F41.1       Receiving Psychotherapy: Yes   with Cato Sprang, [facility]  RECOMMENDATIONS:  PDMP reviewed.  Gabapentin  filled 09/16/2023.  Xanax  filled 09/08/2022. I provided approximately 20 minutes of face to face time during this encounter, including time spent before and after the visit in records review, medical decision making, counseling pertinent to today's visit, and charting.   I recommend increasing the Wellbutrin .  Continue Xanax  0.25 mg, 1-2 p.o. twice daily as needed.  She rarely takes. Increase Wellbutrin  XL to 300 mg, 1 p.o. every morning. Continue therapy with Deane Sprang, [facility]. Return in 6 weeks.    Verneita Cooks, PA-C

## 2024-01-15 ENCOUNTER — Encounter: Payer: Self-pay | Admitting: Radiology

## 2024-01-26 ENCOUNTER — Ambulatory Visit
Admission: RE | Admit: 2024-01-26 | Discharge: 2024-01-26 | Disposition: A | Discharge status: Home / Self Care | Source: Ambulatory Visit | Attending: Family Medicine | Admitting: Family Medicine

## 2024-01-26 DIAGNOSIS — Z1231 Encounter for screening mammogram for malignant neoplasm of breast: Secondary | ICD-10-CM | POA: Diagnosis not present

## 2024-02-05 DIAGNOSIS — J4540 Moderate persistent asthma, uncomplicated: Secondary | ICD-10-CM | POA: Diagnosis not present

## 2024-02-05 DIAGNOSIS — Z7185 Encounter for immunization safety counseling: Secondary | ICD-10-CM | POA: Diagnosis not present

## 2024-02-06 DIAGNOSIS — I10 Essential (primary) hypertension: Secondary | ICD-10-CM | POA: Diagnosis not present

## 2024-02-11 DIAGNOSIS — F3342 Major depressive disorder, recurrent, in full remission: Secondary | ICD-10-CM | POA: Diagnosis not present

## 2024-02-11 DIAGNOSIS — M0579 Rheumatoid arthritis with rheumatoid factor of multiple sites without organ or systems involvement: Secondary | ICD-10-CM | POA: Diagnosis not present

## 2024-02-11 DIAGNOSIS — J4520 Mild intermittent asthma, uncomplicated: Secondary | ICD-10-CM | POA: Diagnosis not present

## 2024-02-11 DIAGNOSIS — I10 Essential (primary) hypertension: Secondary | ICD-10-CM | POA: Diagnosis not present

## 2024-02-11 DIAGNOSIS — M810 Age-related osteoporosis without current pathological fracture: Secondary | ICD-10-CM | POA: Diagnosis not present

## 2024-02-29 ENCOUNTER — Ambulatory Visit (INDEPENDENT_AMBULATORY_CARE_PROVIDER_SITE_OTHER): Admitting: Physician Assistant

## 2024-02-29 ENCOUNTER — Encounter: Payer: Self-pay | Admitting: Physician Assistant

## 2024-02-29 DIAGNOSIS — F3341 Major depressive disorder, recurrent, in partial remission: Secondary | ICD-10-CM | POA: Diagnosis not present

## 2024-02-29 DIAGNOSIS — F411 Generalized anxiety disorder: Secondary | ICD-10-CM

## 2024-02-29 NOTE — Progress Notes (Signed)
 [facility]  Patient ID: Rhonda Bush,  MRN: [id number]  PCP: Sun, Vyvyan, MD  Date of Evaluation: 02/29/2024 Time spent:20 minutes  Chief Complaint:  Chief Complaint   Depression; Anxiety; Follow-up    HISTORY/CURRENT STATUS: HPI For routine med check.  Rarely takes the Xanax . Doing well since increasing the Wellbutrin .  Feels a lot better.  Has more energy and motivation.  She is able to enjoy things.   No extreme sadness, tearfulness, or feelings of hopelessness.  Sleeps ok.  ADLs and personal hygiene are normal.   Denies any changes in concentration, making decisions, or remembering things.  Appetite has not changed.  Weight is stable.   No mania, delirium, AH/VH.  No SI/HI.  Her 6 yo grandson is still living with her, which is bringing her a lot of joy.   Individual Medical History/ Review of Systems: Changes? :No     Past medications for mental health diagnoses include: Prozac , Xanax  for 1 month back in the 80s.   Allergies: Egg protein-containing drug products, Influenza vaccines, and Other  Current Medications:  Current Outpatient Medications:    albuterol  (VENTOLIN  HFA) 108 (90 Base) MCG/ACT inhaler, Inhale 2 puffs into the lungs every 4 (four) hours as needed for wheezing or shortness of breath (coughing fits)., Disp: 18 g, Rfl: 3   alendronate (FOSAMAX) 70 MG tablet, Take 70 mg by mouth once a week., Disp: , Rfl:    ALPRAZolam  (XANAX ) 0.25 MG tablet, Take 1-2 tablets (0.25-0.5 mg total) by mouth 2 (two) times daily as needed for anxiety., Disp: 60 tablet, Rfl: 0   Alum Hydroxide-Mag Trisilicate (GAVISCON) 80-14.2 MG CHEW, 2 tablets after meals and at bedtime as needed, Disp: , Rfl:    amLODipine  (NORVASC ) 5 MG tablet, Take 5 mg by mouth daily after breakfast. , Disp: , Rfl:    buPROPion  (WELLBUTRIN  XL) 300 MG 24 hr tablet, Take 1 tablet (300 mg total) by mouth daily., Disp: 90 tablet, Rfl: 1   cetirizine  (ZYRTEC ) 10 MG tablet, Take 1 tablet (10 mg  total) by mouth daily., Disp: 30 tablet, Rfl: 5   cholecalciferol (VITAMIN D3) 25 MCG (1000 UT) tablet, Take 1,000 Units by mouth daily after breakfast. , Disp: , Rfl:    cyanocobalamin 1000 MCG tablet, Take by mouth., Disp: , Rfl:    dorzolamide  (TRUSOPT ) 2 % ophthalmic solution, Place 1 drop into both eyes 2 (two) times daily., Disp: , Rfl:    fluticasone -salmeterol (ADVAIR ) 250-50 MCG/ACT AEPB, Inhale 1 puff into the lungs every 12 (twelve) hours., Disp: 60 each, Rfl: 11   furosemide (LASIX) 20 MG tablet, Take 20 mg by mouth daily. As needed, Disp: , Rfl:    gabapentin  (NEURONTIN ) 100 MG capsule, Take 1 capsule (100 mg total) by mouth 3 (three) times daily., Disp: 30 capsule, Rfl: 3   hydrochlorothiazide (HYDRODIURIL) 25 MG tablet, , Disp: , Rfl:    hydroxychloroquine (PLAQUENIL) 200 MG tablet, See admin instructions., Disp: , Rfl:    latanoprost  (XALATAN ) 0.005 % ophthalmic solution, Place 1 drop into both eyes at bedtime., Disp: , Rfl:    metoprolol  succinate (TOPROL -XL) 100 MG 24 hr tablet, Take 100 mg by mouth daily after breakfast. Take with or immediately following a meal., Disp: , Rfl:    Oyster Shell Calcium 500 MG TABS, 1 tablet with meals, Disp: , Rfl:    telmisartan (MICARDIS) 80 MG tablet, Take 80 mg by mouth daily after breakfast., Disp: , Rfl:    Vitamin D-Vitamin K (  VITAMIN K2-VITAMIN D3) 45-2000 MCG-UNIT CAPS, 1 tablet, Disp: , Rfl:    ibandronate (BONIVA) 150 MG tablet, Take 150 mg by mouth every 30 (thirty) days. (Patient not taking: Reported on 02/29/2024), Disp: , Rfl:  Medication Side Effects: none  Family Medical/ Social History: Changes? See HPI  MENTAL HEALTH EXAM:  There were no vitals taken for this visit.There is no height or weight on file to calculate BMI.  General Appearance: Casual and Well Groomed  Eye Contact:  Good  Speech:  Clear and Coherent and Normal Rate  Volume:  Normal  Mood:  Euthymic  Affect:  Congruent  Thought Process:  Goal Directed and  Descriptions of Associations: Circumstantial  Orientation:  Full (Time, Place, and Person)  Thought Content: Logical   Suicidal Thoughts:  No  Homicidal Thoughts:  No  Memory:  WNL  Judgement:  Good  Insight:  Good  Psychomotor Activity:  Normal  Concentration:  Concentration: Good  Recall:  Good  Fund of Knowledge: Good  Language: Good  Assets:  Communication Skills Desire for Improvement Financial Resources/Insurance Housing Leisure Time Resilience Social Support Transportation  ADL's:  Intact  Cognition: WNL  Prognosis:  Good   DIAGNOSES:    ICD-10-CM   1. Major depressive disorder, recurrent episode, in partial remission  F33.41     2. Generalized anxiety disorder  F41.1      Receiving Psychotherapy: Yes   with Cato Sprang, [facility]  RECOMMENDATIONS:  PDMP reviewed.  Gabapentin  filled 09/16/2023.  Xanax  filled 09/08/2022. I provided approximately 20 minutes of face to face time during this encounter, including time spent before and after the visit in records review, medical decision making, counseling pertinent to today's visit, and charting.   I am glad you are doing so much better.  No changes are needed.  Continue Xanax  0.25 mg, 1-2 p.o. twice daily as needed.  She rarely takes. Continue Wellbutrin  XL  300 mg, 1 p.o. every morning. Continue therapy with Deane Sprang, [facility]. Return in 3 months.  Verneita Cooks, PA-C

## 2024-03-01 ENCOUNTER — Ambulatory Visit (INDEPENDENT_AMBULATORY_CARE_PROVIDER_SITE_OTHER): Admitting: Professional Counselor

## 2024-03-01 ENCOUNTER — Encounter: Payer: Self-pay | Admitting: Professional Counselor

## 2024-03-01 DIAGNOSIS — F3341 Major depressive disorder, recurrent, in partial remission: Secondary | ICD-10-CM

## 2024-03-01 NOTE — Progress Notes (Signed)
"   °      [facility] Counselor/Therapist Progress Note  Patient ID: Rhonda Bush, MRN: [id number],    Date: 03/01/2024  Time Spent: 9:12 AM to 10:01 AM  Treatment Type: Individual Therapy  Reported Symptoms: worries, irritability, family concerns, phase of life concerns, stress  Mental Status Exam:  Appearance:   Neat     Behavior:  Appropriate and Sharing  Motor:  Normal  Speech/Language:   Clear and Coherent and Normal Rate  Affect:  Appropriate and Congruent  Mood:  normal  Thought process:  normal  Thought content:    WNL  Sensory/Perceptual disturbances:    WNL  Orientation:  oriented to person, place, time/date, and situation  Attention:  Good  Concentration:  Good  Memory:  WNL  Fund of knowledge:   Good  Insight:    Good  Judgment:   Good  Impulse Control:  Good   Risk Assessment: Danger to Self:  No Self-injurious Behavior: No Danger to Others: No Duty to Warn:no Physical Aggression / Violence:No  Access to Firearms a concern: No  Gang Involvement:No   Subjective: Patient presented to session to address concerns of anxiety and depression in partial remission.  She reported progress at this time.  She identified prescribing provider as having increased psychotropic medicine.  Counselor and patient discussed patient progress, and counselor facilitated PHQ-9 with a score of 0 and GAD-7 with a score of 2.  Counselor and patient discussed renewal of patient treatment plan, and therapeutic gains made over course of treatment.  Counselor and patient did not renew treatment plan and patient did not schedule follow-up, with treatment being complete.  Patient reported development of having temporary custody of 61-year-old grandson due to complexity of his primary family matters.  She also reported prior roommate to have moved back into home, and voiced having advocated around circumstances.  Patient processed experience of grandsons behavior and emotional concerns, and  identified ways in which she is supporting him including via therapy.  She identified having local family support.  She voiced that while circumstance is stressful, relationship is also fulfilling.  Counselor recommended scholarship opportunities for extracurricular/enrichment activities.  Counselor reinforced patient self advocacy and support outlets.  Patient also processed experience of challenging development in her sons life and ways she is coping.  Patient identified coping mechanism of staying busy and her spirituality; counselor reinforced patient positivity, resiliency and coping strengths.  Interventions: Solution-Oriented/Positive Psychology, Humanistic/Existential, Insight-Oriented, and Assessments, Treatment Planning  Diagnosis:   ICD-10-CM   1. Major depressive disorder, recurrent episode, in partial remission  F33.41     2. Generalized anxiety disorder  F41.1       Plan: Patient is not scheduled for follow-up at this time; patient course of treatment complete.  Patient voiced understanding she may return as needed.  Patient goal to continue self-care, advocacy and utilization of coping skills, positive resourcing including support system and spirituality, resourcing of community support for help with grandson's needs, continue medicinal support.  Rhonda Bush, [facility]                   "

## 2024-05-30 ENCOUNTER — Ambulatory Visit (INDEPENDENT_AMBULATORY_CARE_PROVIDER_SITE_OTHER): Admitting: Physician Assistant
